# Patient Record
Sex: Female | Born: 1970 | Race: White | Hispanic: No | Smoking: Never smoker
Health system: Southern US, Community
[De-identification: ages and names within clinical notes are randomized; demographics above are authoritative.]

## PROBLEM LIST (undated history)

## (undated) DIAGNOSIS — F329 Major depressive disorder, single episode, unspecified: Secondary | ICD-10-CM

## (undated) DIAGNOSIS — F419 Anxiety disorder, unspecified: Secondary | ICD-10-CM

## (undated) DIAGNOSIS — E119 Type 2 diabetes mellitus without complications: Secondary | ICD-10-CM

## (undated) DIAGNOSIS — E669 Obesity, unspecified: Secondary | ICD-10-CM

## (undated) DIAGNOSIS — G932 Benign intracranial hypertension: Secondary | ICD-10-CM

## (undated) DIAGNOSIS — G44209 Tension-type headache, unspecified, not intractable: Secondary | ICD-10-CM

## (undated) DIAGNOSIS — I10 Essential (primary) hypertension: Secondary | ICD-10-CM

## (undated) DIAGNOSIS — F32A Depression, unspecified: Secondary | ICD-10-CM

## (undated) DIAGNOSIS — Z87442 Personal history of urinary calculi: Secondary | ICD-10-CM

## (undated) DIAGNOSIS — R87629 Unspecified abnormal cytological findings in specimens from vagina: Secondary | ICD-10-CM

## (undated) DIAGNOSIS — G4733 Obstructive sleep apnea (adult) (pediatric): Secondary | ICD-10-CM

## (undated) HISTORY — DX: Major depressive disorder, single episode, unspecified: F32.9

## (undated) HISTORY — PX: ABDOMINAL HYSTERECTOMY: SHX81

## (undated) HISTORY — DX: Obstructive sleep apnea (adult) (pediatric): G47.33

## (undated) HISTORY — PX: WISDOM TOOTH EXTRACTION: SHX21

## (undated) HISTORY — DX: Essential (primary) hypertension: I10

## (undated) HISTORY — DX: Depression, unspecified: F32.A

## (undated) HISTORY — DX: Tension-type headache, unspecified, not intractable: G44.209

## (undated) HISTORY — PX: CRYOTHERAPY: SHX1416

## (undated) HISTORY — DX: Obesity, unspecified: E66.9

## (undated) HISTORY — DX: Benign intracranial hypertension: G93.2

## (undated) HISTORY — DX: Anxiety disorder, unspecified: F41.9

---

## 1998-08-19 ENCOUNTER — Ambulatory Visit (HOSPITAL_COMMUNITY): Admission: RE | Admit: 1998-08-19 | Discharge: 1998-08-19 | Payer: Self-pay | Admitting: Neurology

## 1998-08-26 ENCOUNTER — Ambulatory Visit: Admission: RE | Admit: 1998-08-26 | Discharge: 1998-08-26 | Payer: Self-pay | Admitting: Anesthesiology

## 1998-11-02 ENCOUNTER — Encounter: Payer: Self-pay | Admitting: Emergency Medicine

## 1998-11-02 ENCOUNTER — Emergency Department (HOSPITAL_COMMUNITY): Admission: EM | Admit: 1998-11-02 | Discharge: 1998-11-02 | Payer: Self-pay | Admitting: Emergency Medicine

## 1998-11-29 ENCOUNTER — Other Ambulatory Visit: Admission: RE | Admit: 1998-11-29 | Discharge: 1998-11-29 | Payer: Self-pay | Admitting: Obstetrics and Gynecology

## 1999-11-21 ENCOUNTER — Other Ambulatory Visit: Admission: RE | Admit: 1999-11-21 | Discharge: 1999-11-21 | Payer: Self-pay

## 2000-01-04 ENCOUNTER — Ambulatory Visit (HOSPITAL_COMMUNITY): Admission: RE | Admit: 2000-01-04 | Discharge: 2000-01-04 | Payer: Self-pay | Admitting: Obstetrics and Gynecology

## 2000-01-04 ENCOUNTER — Encounter: Payer: Self-pay | Admitting: Obstetrics and Gynecology

## 2000-03-11 ENCOUNTER — Inpatient Hospital Stay (HOSPITAL_COMMUNITY): Admission: AD | Admit: 2000-03-11 | Discharge: 2000-03-11 | Payer: Self-pay | Admitting: Obstetrics and Gynecology

## 2000-05-16 ENCOUNTER — Inpatient Hospital Stay (HOSPITAL_COMMUNITY): Admission: AD | Admit: 2000-05-16 | Discharge: 2000-05-16 | Payer: Self-pay | Admitting: *Deleted

## 2000-05-20 ENCOUNTER — Inpatient Hospital Stay (HOSPITAL_COMMUNITY): Admission: AD | Admit: 2000-05-20 | Discharge: 2000-05-22 | Payer: Self-pay | Admitting: Obstetrics and Gynecology

## 2000-06-25 HISTORY — PX: MANDIBLE FRACTURE SURGERY: SHX706

## 2000-10-24 ENCOUNTER — Other Ambulatory Visit: Admission: RE | Admit: 2000-10-24 | Discharge: 2000-10-24 | Payer: Self-pay | Admitting: Obstetrics and Gynecology

## 2001-10-27 ENCOUNTER — Other Ambulatory Visit: Admission: RE | Admit: 2001-10-27 | Discharge: 2001-10-27 | Payer: Self-pay | Admitting: Obstetrics and Gynecology

## 2001-11-03 ENCOUNTER — Ambulatory Visit (HOSPITAL_BASED_OUTPATIENT_CLINIC_OR_DEPARTMENT_OTHER): Admission: RE | Admit: 2001-11-03 | Discharge: 2001-11-04 | Payer: Self-pay | Admitting: Oral Surgery

## 2002-12-07 ENCOUNTER — Other Ambulatory Visit: Admission: RE | Admit: 2002-12-07 | Discharge: 2002-12-07 | Payer: Self-pay | Admitting: Obstetrics and Gynecology

## 2003-09-29 ENCOUNTER — Inpatient Hospital Stay (HOSPITAL_COMMUNITY): Admission: AD | Admit: 2003-09-29 | Discharge: 2003-09-29 | Payer: Self-pay | Admitting: Obstetrics and Gynecology

## 2003-10-01 ENCOUNTER — Inpatient Hospital Stay (HOSPITAL_COMMUNITY): Admission: AD | Admit: 2003-10-01 | Discharge: 2003-10-01 | Payer: Self-pay | Admitting: Obstetrics and Gynecology

## 2003-10-09 ENCOUNTER — Inpatient Hospital Stay (HOSPITAL_COMMUNITY): Admission: AD | Admit: 2003-10-09 | Discharge: 2003-10-09 | Payer: Self-pay | Admitting: Obstetrics and Gynecology

## 2003-10-12 ENCOUNTER — Inpatient Hospital Stay (HOSPITAL_COMMUNITY): Admission: AD | Admit: 2003-10-12 | Discharge: 2003-10-12 | Payer: Self-pay | Admitting: Obstetrics and Gynecology

## 2003-10-26 ENCOUNTER — Inpatient Hospital Stay (HOSPITAL_COMMUNITY): Admission: AD | Admit: 2003-10-26 | Discharge: 2003-10-28 | Payer: Self-pay | Admitting: Obstetrics and Gynecology

## 2003-10-26 ENCOUNTER — Encounter (INDEPENDENT_AMBULATORY_CARE_PROVIDER_SITE_OTHER): Payer: Self-pay | Admitting: *Deleted

## 2003-11-24 HISTORY — PX: TUBAL LIGATION: SHX77

## 2003-12-10 ENCOUNTER — Other Ambulatory Visit: Admission: RE | Admit: 2003-12-10 | Discharge: 2003-12-10 | Payer: Self-pay | Admitting: Obstetrics and Gynecology

## 2003-12-17 ENCOUNTER — Ambulatory Visit (HOSPITAL_COMMUNITY): Admission: RE | Admit: 2003-12-17 | Discharge: 2003-12-17 | Payer: Self-pay | Admitting: Obstetrics and Gynecology

## 2005-02-09 ENCOUNTER — Ambulatory Visit (HOSPITAL_COMMUNITY): Admission: RE | Admit: 2005-02-09 | Discharge: 2005-02-09 | Payer: Self-pay | Admitting: Neurology

## 2005-02-12 ENCOUNTER — Encounter: Admission: RE | Admit: 2005-02-12 | Discharge: 2005-02-12 | Payer: Self-pay | Admitting: Neurology

## 2005-02-17 ENCOUNTER — Emergency Department (HOSPITAL_COMMUNITY): Admission: EM | Admit: 2005-02-17 | Discharge: 2005-02-17 | Payer: Self-pay | Admitting: Emergency Medicine

## 2005-02-18 ENCOUNTER — Emergency Department (HOSPITAL_COMMUNITY): Admission: EM | Admit: 2005-02-18 | Discharge: 2005-02-18 | Payer: Self-pay | Admitting: *Deleted

## 2005-06-11 ENCOUNTER — Ambulatory Visit (HOSPITAL_COMMUNITY): Admission: RE | Admit: 2005-06-11 | Discharge: 2005-06-11 | Payer: Self-pay | Admitting: Neurology

## 2005-07-18 ENCOUNTER — Ambulatory Visit (HOSPITAL_COMMUNITY): Admission: RE | Admit: 2005-07-18 | Discharge: 2005-07-18 | Payer: Self-pay | Admitting: Neurology

## 2008-03-24 ENCOUNTER — Encounter: Admission: RE | Admit: 2008-03-24 | Discharge: 2008-03-24 | Payer: Self-pay | Admitting: Neurology

## 2008-04-01 ENCOUNTER — Ambulatory Visit (HOSPITAL_COMMUNITY): Admission: RE | Admit: 2008-04-01 | Discharge: 2008-04-01 | Payer: Self-pay | Admitting: Neurology

## 2008-04-06 ENCOUNTER — Encounter: Admission: RE | Admit: 2008-04-06 | Discharge: 2008-04-06 | Payer: Self-pay | Admitting: Neurology

## 2010-02-10 ENCOUNTER — Emergency Department (HOSPITAL_COMMUNITY): Admission: EM | Admit: 2010-02-10 | Discharge: 2010-02-10 | Payer: Self-pay | Admitting: Emergency Medicine

## 2010-11-10 NOTE — Op Note (Signed)
[place]  Patient:    Kimberly [name], Kimberly [name] Visit Number: [id number] MRN: [id number]          Service Type: DSU Location: [facility] Attending Physician:  Lovena Le Dictated by:   Lovena Le, D.D.S Admit Date:  11/03/2001                       Operative Report  DATE OF BIRTH:  1970/10/13.  PREOPERATIVE DIAGNOSES: 1. Mandibular hypoplasia. 2. Impacted teeth #6 and 11.  POSTOPERATIVE DIAGNOSES: 1. Mandibular hypoplasia. 2. Impacted teeth #6 and 11.  PROCEDURES: 1. Bilateral sagittal split ramus osteotomies of the mandible withrigid    internal fixation. 2. Surgical extraction of impacted teeth6 and 11.  ANESTHESIA:  General via nasoendotracheal intubation.  SURGEON:  Lovena Le, D.D.S  ASSISTANT:  Saddie Benders, D.D.S.  IV FLUIDS:  800 cc of lactated Ringers.  ESTIMATED BLOOD LOSS:  Minimal.  URINE OUTPUT:  None.  SPECIMENS:  None.  INDICATION:  Kimberly [name] is an otherwise healthy 40 year old female with multiple oral-related complaints.  She is unable to chew her food.  Shehas frequent jaw pain and headaches, and the impacted maxillary canines have not been eruptable due to severe impaction and malposition.  DESCRIPTION OF PROCEDURE:  The patient was brought to the operating room at [facility] in satisfactory preoperative condition and placed on the operating room table in the supine position.  Following successful induction of general anesthesia via nasoendotracheal intubation, the patient was prepped and draped in the usual sterile fashion for a procedure of this type.  Initially the oral cavity was thoroughly irrigated with normal saline and suctioned dry and an oropharyngeal throat pack was placed, which was removed at the conclusion of the case.  Next approximately 3 cc of a 2% lidocaine solution with 1:100,000 epinephrine was injected into the palatal soft tissues.  An additional 4 cc  of lidocaine wasinjected into the mandibular posterior regions bilaterally, also 4 cc ofa 0.5% Marcaine solution with 1:200,000 epinephrine was injected into the mandible.  Next a crestal incision was created in the region of the maxillary impacted canines bilaterally.  The periosteal elevator was then utilized to raise a full-thickness mucoperiosteal flap along the palate, thereby exposing the impacted canine teeth #6 and 11.  Next using a Hall drill with a 702 bur, the maxillary canine teeth were then sectioned into multiple fragments and removed with elevator technique.  The operative site was then thoroughly irrigated and closed with multiple interrupted 4-0 chromic gut sutures.  Attention was then turned to the mandibular surgery.  First Bovie electrocautery was utilized in the cutting mode to create an incision along the ascending ramus of the right mandible.  The incision extended for approximately 3 cm and was carried down through mucosa andmuscle to the ascending ramus of the mandible.  A periosteal elevator was again utilized to raise a full-thickness mucoperiosteal flap, exposing both the medial and lateral aspects of the mandible.  A ramus stripper was then utilized to retract and strip the attachment of the temporalis to the coronoid process.  A self-retaining Kocher was then placed at the superior aspect of the ramus.  Next dissection proceeded along the medial aspect of the mandible until the mandibular lingula was identified.  A Hanahan retractor was then placed along the medial aspect so as to protect the entrance of the mandibular nerve into the mandible.  Next utilizing a long  Lindemann bur on the handpiece, the medial cut of the sagittal split osteotomy was then carried out under copious irrigation.  The 701 bur wasthen utilized to create the descending cut along the ascending ramus.  Achannel retractor was then placed at the inferior border, and the 703 bur was utilized  to make the lateral cut down to the inferior border of themandible.  Next multiple osteotomes were used in succession with a malleting technique to carry out the sagittal split.  The Smith mandibular spreading instruments were then utilized to finalize the sagittal split of the right aspect of the mandible.  The inferior alveolar nerve was noted to be intact and within the distal segment as planned.  This completed the sagittal split on the right side.  A pack was placed within the operative site, and attention was directed to the patients left side.  The mandibular sagittal split osteotomy was created and carried out in an identical fashion as was done on the right side.  Once the mandible was completely freed, the previously-fabricated occlusal splint was wired into place, and itshould also be noted that the mandibular nerve was noted to be uninjuredand intact on the left as well.  Once the patient was wired into the corrected occlusion, the proximal segment on the left side was then positioned into its correct position and stabilized with two Osteomed screws.  The anterior screw was 14 mm in length and the posterior screw was 12 mm inlength.  The fixation was then tested with a periosteal elevator and attention was directed back to the patients right side, where the mandibularsegments were positioned into their correct position and fixated with two additional Osteomed screws, again 14 mm and 12 mm in length.  The patient was then released from maxillomandibular fixation.  The operative sites were thoroughly irrigated, and the sites were then closed with a running 3-0 Vicryl suture bilaterally.  The oropharyngeal throat pack was removed, and the patient was placed into maxillomandibular fixation with two anterior 26-gauge stainless steel wires.  This completed the procedure.  The patient was allowed to recover from general anesthesia and was transported to the postanesthesia care unit in  satisfactory postoperative condition.  All sponge, needle, and instrument counts were correct at the conclusion of the case.  Dictated by:   Lovena Le, D.D.S Attending Physician:  Lovena Le DD:  11/03/01 TD:  11/04/01 Job: [id number]

## 2010-11-10 NOTE — Discharge Summary (Signed)
Kimberly [name], Kimberly [name]                       ACCOUNT NO.:  [id number]   MEDICAL RECORD NO.:  [id number]                   PATIENT TYPE:  INP   LOCATION:  9129                                 FACILITY:  [facility]   PHYSICIAN:  Zenaida Niece, M.D.             DATE OF BIRTH:  1970/07/09   DATE OF ADMISSION:  10/26/2003  DATE OF DISCHARGE:  10/28/2003                                 DISCHARGE SUMMARY   ADMISSION DIAGNOSES:  1. Intrauterine pregnancy at 38 weeks.  2. Gestational hypertension.   DISCHARGE DIAGNOSES:  1. Intrauterine pregnancy at 38 weeks.  2. Gestational hypertension.  3. Postpartum hemorrhage.   PROCEDURES:  On Oct 26, 2003 she had a spontaneous vaginal delivery.   HISTORY OF PRESENT ILLNESS:  This is a 40 year old white female gravida 4  para 1-1-1-2 with an EGA of [redacted] weeks who presents for induction with  gestational hypertension and a favorable cervix.  Prenatal labs:  Blood type  is O positive with a negative antibody screen, RPR nonreactive, rubella  immune, hepatitis B surface antigen negative, HIV negative, gonorrhea and  chlamydia negative, triple screen normal, 1-hour Glucola is 112, and group B  strep is negative.  Prenatal care complicated by sinusitis at 17 weeks  treated with Augmentin and a URI at 26 weeks also treated with Augmentin,  occasional elevated blood pressure since approximately 24 weeks with  occasional proteinuria with a normal 24-hour urine protein.  She had preterm  contractions at 30 weeks with a negative fetal fibronectin and she has had  reactive nonstress tests for decreased fetal movement.  Past OB history:  In  1995 a vaginal delivery at 34 weeks, 6 pounds 9 ounces, complicated by  preterm premature rupture of membranes.  In 1997 elective termination, and  in 2001 a vaginal delivery at 37 weeks, 6 pounds 10 ounces, no  complications.  Past medical history:  Pseudotumor cerebri.  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  Medications:  Diamox.  Physical exam:  She was afebrile with stable vital signs with a blood  pressure of 130/80.  Fetal heart tracing was reactive without contractions.  Abdomen gravid, nontender, with an estimated fetal weight of 7 pounds.  Cervix was 4, 50, -2, with a vertex presentation and amniotomy revealed  clear fluid.   HOSPITAL COURSE:  The patient was admitted and had the above-mentioned  amniotomy performed for induction.  She entered active labor on her own and  received an epidural.  She progressed to complete, pushed well, and on the  afternoon of May 3 had a vaginal delivery of a viable female infant with  Apgars of 6 and 8 that weighed 8 pounds 7 ounces.  There was a tight  shoulder cord reduced with delivery.  The placenta delivered spontaneous and  was intact.  The shoulders were slightly difficult to deliver but there was  not a true  shoulder dystocia.  Arterial cord pH was 7.07.  There were  several superficial perineal abrasions and 3-0 Vicryl was used at 6 o'clock  for cosmesis.  Estimated blood loss was less than 500 mL.  Postpartum she  was initially set up for a postpartum tubal ligation.  However, she passed  some clots and had intermittent increase in bleeding with some  lightheadedness.  This was treated with massage, IV fluids, IV Pitocin, and  Hemabate.  Fundus remained firm and I did an exam and approximately 100 mL  of clots were expressed from the lower uterine segment.  Hemoglobin fell  from a predelivery value of 11.4 to 10.8 immediately postpartum and 8.7 on  postpartum day #1.  Due to her anemia we elected to not proceed with tubal  ligation.  On postpartum day #2 she had decreased bleeding, felt well, and  was felt to stable enough for discharge home.   DISCHARGE INSTRUCTIONS:  1. Regular diet, pelvic rest, and follow up in approximately 6 weeks.  2. Medications are Percocet #20 one to two p.o. q.4-6h. p.r.n. and over-the-      counter ibuprofen as needed as well as over-the-counter iron b.i.d.  3. She is given our discharge pamphlet.                                               Zenaida Niece, M.D.    TDM/MEDQ  D:  11/09/2003  T:  11/09/2003  Job:  [id number]

## 2010-11-10 NOTE — Procedures (Signed)
REFERRING PHYSICIAN:  Rene Kocher, M.D.   CLINICAL HISTORY:  A 40 year old lady with an episode of loss of  consciousness.   MEDICATION LIST:  Wellbutrin, Lexapro, hydrochlorothiazide, Diamox, and  Restoril.   The background awake rhythm is 9 to 10 hertz alpha which is symmetric in  both hemispheres and reactive to eye opening and closure.  No definite  paroxysmal epileptiform activities, spikes or sharp waves are seen.  Definite sleep changes are not seen except mild drowsiness which is  uneventful.  Hyperventilation results in no significant abnormalities.  Photic stimulation results in symmetric driving response.  EKG tracing  revealed regular sinus rhythm.  Technical component of the study is  adequate.  Length of the tracing is 25.5 minutes.   IMPRESSION:  This electroencephalogram performed in wakeful and drowsy  states is within normal limits, no definite epileptiform activity is  identified.           ______________________________  Sunny Schlein. Pearlean Brownie, MD     ZOX:WRUE  D:  07/18/2005 16:18:38  T:  07/18/2005 23:11:46  Job #:  [id number]   cc:   Rene Kocher, M.D.  Fax: [phone]

## 2010-11-10 NOTE — H&P (Signed)
NAME:  Kimberly [name], Kimberly [name] NO.:  [id number]   MEDICAL RECORD NO.:  [id number]                   PATIENT TYPE:   LOCATION:                                       FACILITY:   PHYSICIAN:  Zenaida Niece, M.D.             DATE OF BIRTH:   DATE OF ADMISSION:  12/17/2003  DATE OF DISCHARGE:                                HISTORY & PHYSICAL   CHIEF COMPLAINT:  Desires surgical sterility.   HISTORY OF PRESENT ILLNESS:  This is a 40 year old white female, para 2-1-1-  3, who is status post a spontaneous vaginal delivery on May 3 complicated by  a postpartum hemorrhage, who desires surgical sterility.  All contraceptive  options have been discussed with the patient as well as permanency and risks  of tubal ligation, and she wishes to proceed with laparoscopic tubal  fulguration.   PAST OBSTETRICAL HISTORY:  She has had a preterm delivery at 34 weeks, 6  pounds 9 ounces followed by an elective termination followed by two term  vaginal deliveries, this most recent one complicated by postpartum  hemorrhage and gestational hypertension.   PAST MEDICAL HISTORY:  Pseudotumor cerebri.   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   CURRENT MEDICATIONS:  Diamox 2 p.o. daily.   ALLERGIES:  None known.   SOCIAL HISTORY:  She is married and denies alcohol, tobacco, or drug use.   FAMILY HISTORY:  No GYN or colon cancer.   REVIEW OF SYSTEMS:  She is bottle feeding and does have some feet swelling.   PHYSICAL EXAMINATION:  GENERAL:  This is a slightly obese white female in no  acute distress.  VITAL SIGNS:  Weight is 214 pounds, blood pressure 142/88.  NECK:  Supple without lymphadenopathy or thyromegaly.  LUNGS:  Clear to auscultation.  HEART:  Regular rate and rhythm without murmur.  ABDOMEN:  Soft, nontender, nondistended without palpable masses.  PELVIC:  External genitalia is well healed.  Speculum exam has a normal  cervix.  Pap smear  performed and does return within normal limits.  Bimanual  exam shows a small, mid planar, nontender uterus and no adnexal masses.   IMPRESSION:  Desires surgical sterility.  All options and risks of surgery  including bleeding, infection, damage to surrounding organs have been  discussed with the patient.  She also understands a 1:150 failure rate and  increased risk of ectopic pregnancy if she does become pregnant.  The  patient understands these risks and wishes to proceed.   PLAN:  Admit the patient for a laparoscopic bilateral tubal fulguration.                                               Zenaida Niece, M.D.    TDM/MEDQ  D:  12/16/2003  T:  12/16/2003  Job:  [id number]

## 2010-11-10 NOTE — Procedures (Signed)
EEG NUMBER:   REFERRING PHYSICIAN:  Rene Kocher, M.D.   CLINICAL HISTORY:  A 40 year old lady with generalized headaches, new onset  blackouts.   MEDICATIONS:  Wellbutrin, Lexapro, Diamox, Xanax, and Restoril.   This is a 17-channel EEG performed with the patient described as being  lethargic.   The background rhythm consists of 9 to 10 hertz alpha which is of moderate  amplitude, synchronous in both hemispheres and reactive to eye opening and  closure. No paroxysmal epileptiform activity, spikes, or sharp waves are  identified. Intermittent biparietal sharp waves are identified bilaterally  particularly with drowsiness. Changes of light sleep are achieved in this  tracing and show physiological findings. No definite paroxysmal epileptiform  activity is identified. Hyperventilation and photic stimulation are both  unremarkable. Length of the tracing is 31.2 minutes. Technical component is  suboptimal with excessive movement artifacts compromising the study. EKG  tracing is also suboptimal but appears to be a regular sinus rhythm.   IMPRESSION:  This EEG is abnormal due to the presence of bilateral mild  _______________. If seizures are expected, a repeat sleep deprived study may  be beneficial.           ______________________________  Sunny Schlein. Pearlean Brownie, MD     ZOX:WRUE  D:  06/11/2005 16:26:57  T:  06/13/2005 08:26:01  Job #:  [id number]

## 2010-11-10 NOTE — Discharge Summary (Signed)
[facility]  Patient:    Kimberly [name], Kimberly [name]                    MRN: [id number] Adm. Date:  [id number] Disc. Date: 05/22/00 Attending:  Malon Kindle                           Discharge Summary  DISCHARGE DIAGNOSES:          1. Term pregnancy at 37+ weeks, delivered.                               2. History of pseudotumor of cerebri.                               3. Meconium-stained amniotic fluid.                               4. Status post normal spontaneous vaginal                                  delivery.  DISCHARGE MEDICATIONS:        1. Motrin 600 mg p.o. q.6h. p.r.n.                               2. Percocet one to two tablets p.o. q.4h. p.r.n.  DISCHARGE FOLLOWUP:           The patient is to follow up in six weeks for her routine postpartum exam.  ADMISSION HISTORY:            The patient is a 40 year old, G3, P 1-0-1-1, who was admitted at 37+ weeks in early labor.  Pregnancy had been complicated for preterm labor at 28 weeks for which she had been managed with bed rest and terbutaline.  Also, pregnancy was complicated by history of pseudotumor of cerebri which was treated with Diamox throughout her pregnancy.  PRENATAL LABORATORY DATA:     O positive, antibody negative, RPR nonreactive, rubella immune, hepatitis B surface antigen negative, HIV negative, GC negative, Chlamydia negative.  PAST MEDICAL HISTORY:         Significant for pseudotumor cerebri as stated.  ALLERGIES:                    She had no known drug allergies.  PAST SURGICAL HISTORY:        No surgeries.  MEDICATIONS ON ADMISSION:     1. Diamox 250 mg p.o. q.d.                               2. Prenatal vitamins.  OBSTETRICAL HISTORY:          Significant for a normal spontaneous vaginal delivery of a 6-pound 9-ounce female at approximately 35 weeks.  HOSPITAL COURSE:              The patient was admitted with contractions every three to four minutes with  slow cervical change.  She was rule out for rupture of membranes.  On admission she was afebrile with stable vital signs.  Fetal  heart rate was reactive.  Cervix was 4 cm, 70% effaced, and a -2 station.  The patient had assisted rupture of membranes after progressing to 5 cm, 70% effaced, and -1 station.  Fetal heart rate remained reactive.  She had light meconium noted.  She progressed quickly thereafter and received some Pitocin augmentation.  She then progressed to complete and pushed well with a normal spontaneous vaginal delivery of a 6-pound 10-ounce vigorous female infant over an intact perineum.  Apgars were _______ .  Placenta was delivered spontaneously.  She had three labial abrasions which were hemostatic and not repaired.  The infant was DeLee suctioned with dilute meconium noted, and pediatrics evaluated the baby shortly after delivery.  She did well and on postpartum day #2 was discharged to home, afebrile, stable vital signs, and no complaints.  She was going to continue her Diamox and was bottle feeding upon discharge. DD:  05/22/00 TD:  05/22/00 Job: [id number]

## 2010-11-10 NOTE — Op Note (Signed)
[name], Kimberly [name]                       ACCOUNT NO.:  [id number]   MEDICAL RECORD NO.:  [id number]                   PATIENT TYPE:  AMB   LOCATION:  SDC                                  FACILITY:  [facility]   PHYSICIAN:  Zenaida Niece, M.D.             DATE OF BIRTH:  [birth year]   DATE OF PROCEDURE:  12/17/2003  DATE OF DISCHARGE:                                 OPERATIVE REPORT   PREOPERATIVE DIAGNOSIS:  Desires surgical sterility.   POSTOPERATIVE DIAGNOSIS:  Desires surgical sterility.   PROCEDURE:  Laparoscopic bilateral tubal fulguration.   SURGEON:  Zenaida Niece, M.D.   ANESTHESIA:  General endotracheal anesthesia.   ESTIMATED BLOOD LOSS:  Less than 50 mL.   FINDINGS:  The patient had normal anatomy.  There was a small serosal tear  made in the posterior uterus with the Kleppinger while moving the bowel.  Minimal bleeding from this was controlled with Bipolar cautery.   DESCRIPTION OF PROCEDURE:  The patient was taken to the operating room and  placed in the dorsal supine position.  General anesthesia was induced and  she was placed in mobile stirrups and the left arm tucked to her side.  Abdomen was prepped and draped in the usual sterile fashion, bladder drained  with a red rubber catheter, Hulka tenaculum applied to the cervix for  uterine manipulation.  Infraumbilical skin was then infiltrated with 0.25%  Marcaine and a 1.5 cm horizontal incision was made.  The Veress needle  was  inserted into the peritoneal cavity and placement confirmed by the water  drop test and an opening pressure of 4 mmHg.  CO2 gas was insufflated to a  pressure of 14 and the Veress needle  was removed.  A size 11 disposable  trocar was introduced and placement confirmed by the laparoscope.  Inspection of the pelvis revealed normal tubes, ovaries and uterus.  No  significant adhesions.  The sigmoid colon was up-draped over the left fundal  portion of the uterus.  In moving this away,  a small tear was made in the  posterior uterine serosa.  Bleeding from this was controlled with bipolar  cautery.  Both fallopian tubes were identified and traced to their  fimbriated ends.  Three segments of each tube in the mid portion were  desiccated with bipolar cautery.  This was done until the amp meter read 0  in all spots.  This achieved good separation of the proximal and distal ends  of the tube.  The defect in the posterior uterine serosa was again inspected  and found to be hemostatic.  The laparoscope was removed and all gas allowed  to deflate from the abdomen.  The umbilical trocar was removed.  Skin  incision was closed with interrupted subcuticular sutures of 3-0 Vicryl  followed by Steri-Strips and band-aid.  The patient tolerated the procedure  well, was extubated in the operating room and taken to  the recovery room in  stable condition.  Counts were correct and she received no antibiotics.  Of  note, while attempting to balance the scope, the light cord came off the  scope and make a small burn in the drape.  However, there was a towel over  the patient and there was no burn noted on the patient.                                               Zenaida Niece, M.D.   TDM/MEDQ  D:  12/17/2003  T:  12/17/2003  Job:  [id number]

## 2011-01-23 ENCOUNTER — Encounter: Payer: Self-pay | Admitting: Family Medicine

## 2011-01-23 DIAGNOSIS — E669 Obesity, unspecified: Secondary | ICD-10-CM | POA: Insufficient documentation

## 2011-03-26 LAB — CSF CELL COUNT WITH DIFFERENTIAL
RBC Count, CSF: 0
RBC Count, CSF: 1 — ABNORMAL HIGH
Tube #: 1
Tube #: 4
WBC, CSF: 0
WBC, CSF: 1

## 2011-03-26 LAB — CRYPTOCOCCAL ANTIGEN, CSF: Crypto Ag: NEGATIVE

## 2011-03-26 LAB — CSF CULTURE: Culture: NO GROWTH

## 2011-03-26 LAB — CSF CULTURE W GRAM STAIN

## 2011-03-26 LAB — PROTEIN AND GLUCOSE, CSF
Glucose, CSF: 59
Total  Protein, CSF: 21

## 2012-09-30 ENCOUNTER — Telehealth: Payer: Self-pay | Admitting: Family Medicine

## 2012-09-30 MED ORDER — ALPRAZOLAM 1 MG PO TABS: 1.0000 mg | ORAL_TABLET | Freq: Every evening | ORAL | Status: DC | PRN

## 2012-09-30 NOTE — Telephone Encounter (Signed)
[facility]

## 2012-09-30 NOTE — Telephone Encounter (Signed)
Ok to refill 

## 2012-09-30 NOTE — Telephone Encounter (Signed)
?  ok to refill °

## 2012-10-29 ENCOUNTER — Other Ambulatory Visit: Payer: Self-pay | Admitting: Family Medicine

## 2012-10-29 ENCOUNTER — Telehealth: Payer: Self-pay | Admitting: Family Medicine

## 2012-10-29 NOTE — Telephone Encounter (Signed)
Ok to refill 

## 2012-10-29 NOTE — Telephone Encounter (Signed)
[facility]

## 2012-10-29 NOTE — Telephone Encounter (Signed)
?   OK to Refill  

## 2012-11-06 ENCOUNTER — Ambulatory Visit: Payer: Self-pay | Admitting: Family Medicine

## 2012-12-03 ENCOUNTER — Other Ambulatory Visit: Payer: Self-pay | Admitting: Family Medicine

## 2012-12-03 NOTE — Telephone Encounter (Signed)
?   OK to Refill  

## 2012-12-03 NOTE — Telephone Encounter (Signed)
Ok to refill 

## 2012-12-04 ENCOUNTER — Telehealth: Payer: Self-pay | Admitting: Family Medicine

## 2012-12-05 NOTE — Telephone Encounter (Signed)
Called in 12/03/12

## 2012-12-24 ENCOUNTER — Other Ambulatory Visit: Payer: Self-pay | Admitting: Family Medicine

## 2012-12-24 NOTE — Telephone Encounter (Signed)
This was filled on 6/9, too early for refill, she has appt 7/9 with Dr. Tanya Nones, would wait until appt

## 2012-12-24 NOTE — Telephone Encounter (Signed)
?  ok to refill.[facility]

## 2012-12-24 NOTE — Telephone Encounter (Signed)
Noted pt. aware

## 2012-12-31 ENCOUNTER — Encounter: Payer: Self-pay | Admitting: Family Medicine

## 2012-12-31 ENCOUNTER — Ambulatory Visit (INDEPENDENT_AMBULATORY_CARE_PROVIDER_SITE_OTHER): Payer: BC Managed Care – PPO | Admitting: Family Medicine

## 2012-12-31 VITALS — BP 120/80 | HR 78 | Temp 98.0°F | Resp 18 | Ht 61.0 in | Wt 248.0 lb

## 2012-12-31 DIAGNOSIS — I10 Essential (primary) hypertension: Secondary | ICD-10-CM | POA: Insufficient documentation

## 2012-12-31 DIAGNOSIS — F419 Anxiety disorder, unspecified: Secondary | ICD-10-CM | POA: Insufficient documentation

## 2012-12-31 DIAGNOSIS — F32A Depression, unspecified: Secondary | ICD-10-CM | POA: Insufficient documentation

## 2012-12-31 DIAGNOSIS — Z1239 Encounter for other screening for malignant neoplasm of breast: Secondary | ICD-10-CM

## 2012-12-31 DIAGNOSIS — F411 Generalized anxiety disorder: Secondary | ICD-10-CM

## 2012-12-31 DIAGNOSIS — F329 Major depressive disorder, single episode, unspecified: Secondary | ICD-10-CM

## 2012-12-31 DIAGNOSIS — G932 Benign intracranial hypertension: Secondary | ICD-10-CM

## 2012-12-31 LAB — COMPLETE METABOLIC PANEL WITH GFR
ALT: 30 U/L (ref 0–35)
AST: 15 U/L (ref 0–37)
Albumin: 4.3 g/dL (ref 3.5–5.2)
Alkaline Phosphatase: 79 U/L (ref 39–117)
BUN: 19 mg/dL (ref 6–23)
CO2: 22 mEq/L (ref 19–32)
Calcium: 9.4 mg/dL (ref 8.4–10.5)
Chloride: 108 mEq/L (ref 96–112)
Creat: 1.18 mg/dL — ABNORMAL HIGH (ref 0.50–1.10)
GFR, Est African American: 66 mL/min
GFR, Est Non African American: 57 mL/min — ABNORMAL LOW
Glucose, Bld: 104 mg/dL — ABNORMAL HIGH (ref 70–99)
Potassium: 4.1 mEq/L (ref 3.5–5.3)
Sodium: 142 mEq/L (ref 135–145)
Total Bilirubin: 0.6 mg/dL (ref 0.3–1.2)
Total Protein: 6.7 g/dL (ref 6.0–8.3)

## 2012-12-31 LAB — LIPID PANEL
Cholesterol: 196 mg/dL (ref 0–200)
HDL: 32 mg/dL — ABNORMAL LOW (ref >39)
LDL Cholesterol: 110 mg/dL — ABNORMAL HIGH (ref 0–99)
Total CHOL/HDL Ratio: 6.1 Ratio
Triglycerides: 268 mg/dL — ABNORMAL HIGH (ref <150)
VLDL: 54 mg/dL — ABNORMAL HIGH (ref 0–40)

## 2012-12-31 LAB — CBC WITH DIFFERENTIAL/PLATELET
Basophils Absolute: 0 10*3/uL (ref 0.0–0.1)
Basophils Relative: 0 % (ref 0–1)
Eosinophils Absolute: 0.1 10*3/uL (ref 0.0–0.7)
Eosinophils Relative: 2 % (ref 0–5)
HCT: 42.2 % (ref 36.0–46.0)
Hemoglobin: 14.4 g/dL (ref 12.0–15.0)
Lymphocytes Relative: 31 % (ref 12–46)
Lymphs Abs: 2.3 10*3/uL (ref 0.7–4.0)
MCH: 28.6 pg (ref 26.0–34.0)
MCHC: 34.1 g/dL (ref 30.0–36.0)
MCV: 83.9 fL (ref 78.0–100.0)
Monocytes Absolute: 0.5 10*3/uL (ref 0.1–1.0)
Monocytes Relative: 7 % (ref 3–12)
Neutro Abs: 4.4 10*3/uL (ref 1.7–7.7)
Neutrophils Relative %: 60 % (ref 43–77)
Platelets: 172 10*3/uL (ref 150–400)
RBC: 5.03 MIL/uL (ref 3.87–5.11)
RDW: 13.3 % (ref 11.5–15.5)
WBC: 7.4 10*3/uL (ref 4.0–10.5)

## 2012-12-31 MED ORDER — ESCITALOPRAM OXALATE 10 MG PO TABS: 10.0000 mg | ORAL_TABLET | Freq: Every day | ORAL | Status: DC

## 2012-12-31 NOTE — Progress Notes (Signed)
Subjective:    Patient ID: Kimberly [name], female    DOB: May 18, 1971, 42 y.o.   MRN: [id number]  HPI Patient has a history of generalized anxiety disorder as well as depression. She is currently taking Wellbutrin SR 150 mg by mouth twice a day. She is also requiring Xanax 1 mg by mouth 3 times a day. She reports feeling palpitations, tinnitus, panic attacks when she doesn't take the medicine every 8 hours. She also reports increased stress in her life and worsening depression.  She has never tried Lexapro to her knowledge.  Has hypertension currently on atenolol 100 mg by mouth daily. She denies any chest pain or shortness of breath or dyspnea on exertion. She is overdue for cholesterol.  She was recently found to have precancerous cells on Pap smear. I encouraged her to follow back up with her gynecologist and she underwent a cone biopsy.  I stressed this needs to be followed up. She is also overdue for mammogram.  She is a history of pseudotumor cerebri.  Headaches are currently well controlled on Diamox 250 mg tablets twice a day.  She is overdue for metabolic panel. Past Medical History  Diagnosis Date  . Obesity   . Pseudotumor cerebri   . Migraine   . Tension headache   . Anxiety   . Depression   . Cancer     cervical  . Hypertension    Current Outpatient Prescriptions on File Prior to Visit  Medication Sig Dispense Refill  . acetaZOLAMIDE (DIAMOX) 250 MG tablet Take 250 mg by mouth 2 (two) times daily.        Marland Kitchen ALPRAZolam (XANAX) 1 MG tablet TAKE ONE TABLET BY MOUTH THREE TIMES DAILY AS NEEDED  90 tablet  0  . atenolol (TENORMIN) 100 MG tablet Take 100 mg by mouth daily.        Marland Kitchen buPROPion (WELLBUTRIN SR) 150 MG 12 hr tablet Take 150 mg by mouth 2 (two) times daily.        Marland Kitchen loratadine (CLARITIN) 10 MG tablet Take 10 mg by mouth daily.        . valACYclovir (VALTREX) 500 MG tablet Take 500 mg by mouth daily.         No current facility-administered medications on file prior  to visit.   No Known Allergies History   Social History  . Marital Status: Legally Separated    Spouse Name: N/A    Number of Children: N/A  . Years of Education: N/A   Occupational History  . Not on file.   Social History Main Topics  . Smoking status: Never Smoker   . Smokeless tobacco: Not on file  . Alcohol Use: Not on file  . Drug Use: Not on file  . Sexually Active: Not on file   Other Topics Concern  . Not on file   Social History Narrative  . No narrative on file   History reviewed. No pertinent family history.    Review of Systems  All other systems reviewed and are negative.       Objective:   Physical Exam  Vitals reviewed. Constitutional: She appears well-developed and well-nourished.  Neck: Neck supple. No JVD present. No thyromegaly present.  Cardiovascular: Normal rate, regular rhythm, normal heart sounds and intact distal pulses.  Exam reveals no gallop and no friction rub.   No murmur heard. Pulmonary/Chest: Effort normal and breath sounds normal. No respiratory distress. She has no wheezes. She has no rales. She exhibits  no tenderness.  Abdominal: Soft. Bowel sounds are normal. She exhibits no distension and no mass. There is no tenderness. There is no rebound and no guarding.  Lymphadenopathy:    She has no cervical adenopathy.  Skin: Skin is warm. No rash noted. No erythema. No pallor.  Psychiatric: She has a normal mood and affect. Her behavior is normal. Judgment and thought content normal.   She has trace bipedal edema.       Assessment & Plan:  1. GAD (generalized anxiety disorder) I will add Lexapro 10 mg by mouth daily to her current regimen. I have asked the patient to call me in one month to see if anxiety is improving. She will otherwise continue the Wellbutrin and Xanax - escitalopram (LEXAPRO) 10 MG tablet; Take 1 tablet (10 mg total) by mouth daily.  Dispense: 30 tablet; Refill: 1  2. Pseudotumor cerebri Check a basic  metabolic panel to rule out acidosis  3. Depression Add Lexapro as discussed in problem #1. - escitalopram (LEXAPRO) 10 MG tablet; Take 1 tablet (10 mg total) by mouth daily.  Dispense: 30 tablet; Refill: 1 - CBC with Differential  4. HTN (hypertension) Well controlled, continue atenolol 100 mg by mouth daily. Check a fasting lipid panel. Goal LDL is less than 130.  Patient is also overweight. I did recommend increasing daily aerobic exercise to 30 minutes a day 5 days a week to achieve weight loss. - COMPLETE METABOLIC PANEL WITH GFR - Lipid panel - CBC with Differential  5. Breast cancer screening Scheduled mammogram. - MM Digital Screening; Future

## 2013-01-02 ENCOUNTER — Other Ambulatory Visit: Payer: Self-pay | Admitting: Family Medicine

## 2013-01-27 ENCOUNTER — Other Ambulatory Visit: Payer: Self-pay | Admitting: Family Medicine

## 2013-01-27 NOTE — Telephone Encounter (Signed)
rx called in

## 2013-01-27 NOTE — Telephone Encounter (Signed)
Ok to refill 

## 2013-01-27 NOTE — Telephone Encounter (Signed)
?   OK to Refill  

## 2013-02-26 ENCOUNTER — Other Ambulatory Visit: Payer: Self-pay | Admitting: Family Medicine

## 2013-02-26 NOTE — Telephone Encounter (Signed)
ok 

## 2013-02-26 NOTE — Telephone Encounter (Signed)
?   OK to Refill  

## 2013-03-31 ENCOUNTER — Other Ambulatory Visit: Payer: Self-pay | Admitting: Family Medicine

## 2013-03-31 NOTE — Telephone Encounter (Signed)
RX called in .

## 2013-03-31 NOTE — Telephone Encounter (Signed)
Last refill 9/4 #90  Last OV 12/31/12  OK refill?

## 2013-03-31 NOTE — Telephone Encounter (Signed)
ok 

## 2013-04-29 ENCOUNTER — Other Ambulatory Visit: Payer: Self-pay | Admitting: Family Medicine

## 2013-04-29 MED ORDER — ACETAZOLAMIDE 250 MG PO TABS: 250.0000 mg | ORAL_TABLET | Freq: Two times a day (BID) | ORAL | Status: DC

## 2013-04-29 NOTE — Telephone Encounter (Signed)
Rx Refilled  

## 2013-05-04 ENCOUNTER — Other Ambulatory Visit: Payer: Self-pay | Admitting: Family Medicine

## 2013-05-04 NOTE — Telephone Encounter (Signed)
Ok to refill 

## 2013-05-04 NOTE — Telephone Encounter (Signed)
ok 

## 2013-05-26 ENCOUNTER — Other Ambulatory Visit: Payer: Self-pay | Admitting: Family Medicine

## 2013-05-26 NOTE — Telephone Encounter (Signed)
ok 

## 2013-05-26 NOTE — Telephone Encounter (Signed)
Last Rf 05/04/13  #90  Last OV 7/9  OK refill?

## 2013-06-23 ENCOUNTER — Other Ambulatory Visit: Payer: Self-pay | Admitting: Family Medicine

## 2013-06-23 NOTE — Telephone Encounter (Signed)
Ok to refill but NTBS before next refill (6 months).

## 2013-06-23 NOTE — Telephone Encounter (Signed)
?   OK to Refill  

## 2013-07-24 ENCOUNTER — Other Ambulatory Visit: Payer: Self-pay | Admitting: Family Medicine

## 2013-07-24 NOTE — Telephone Encounter (Signed)
ok 

## 2013-07-24 NOTE — Telephone Encounter (Signed)
?   OK to Refill  

## 2013-08-18 ENCOUNTER — Ambulatory Visit: Payer: BC Managed Care – PPO | Admitting: Family Medicine

## 2013-08-21 ENCOUNTER — Ambulatory Visit: Payer: Self-pay | Admitting: Family Medicine

## 2013-08-28 ENCOUNTER — Encounter: Payer: Self-pay | Admitting: Family Medicine

## 2013-08-28 ENCOUNTER — Ambulatory Visit (INDEPENDENT_AMBULATORY_CARE_PROVIDER_SITE_OTHER): Payer: Self-pay | Admitting: Family Medicine

## 2013-08-28 VITALS — BP 110/72 | HR 68 | Temp 97.1°F | Resp 16 | Ht 61.0 in | Wt 265.0 lb

## 2013-08-28 DIAGNOSIS — K529 Noninfective gastroenteritis and colitis, unspecified: Secondary | ICD-10-CM

## 2013-08-28 DIAGNOSIS — R197 Diarrhea, unspecified: Secondary | ICD-10-CM

## 2013-08-28 NOTE — Progress Notes (Signed)
Subjective:    Patient ID: Kimberly [name], female    DOB: May 14, 1971, 43 y.o.   MRN: [id number]  HPI Patient is a very pleasant 43 year old white female who presents with chronic diarrhea for the last 2 months. She states that 15-20 minutes after she she develops a heaviness and fullness in her epigastric area. She denies any sharp or stabbing pain. Then approximately one hour after he she will develop severe artery diarrhea that is bright on ranging in color. She states that it can be times and even projectile. This occurs 3-4 times a day. The patient was last seen in July. She has gained 17 pounds since that office visit. She denies any fevers or weight loss. She denies any chills or arthralgias. She denies any rashes. She does have a significant family history for a mother with Crohn's disease. She denies any change in her diet. She denies any exacerbating or alleviating factors. She denies any indigestion or acid reflux. She is unable to pinpoint certain foods which trigger the diarrhea. She has not changed her diet or any medications. She's not taking any herbal supplements. Past Medical History  Diagnosis Date  . Obesity   . Pseudotumor cerebri   . Migraine   . Tension headache   . Anxiety   . Depression   . Cancer     cervical  . Hypertension    Current Outpatient Prescriptions on File Prior to Visit  Medication Sig Dispense Refill  . acetaZOLAMIDE (DIAMOX) 250 MG tablet Take 1 tablet (250 mg total) by mouth 2 (two) times daily.  60 tablet  3  . ALPRAZolam (XANAX) 1 MG tablet TAKE ONE TABLET BY MOUTH THREE TIMES DAILY AS NEEDED  90 tablet  2  . atenolol (TENORMIN) 100 MG tablet TAKE ONE TABLET BY MOUTH EVERY DAY  30 tablet  5  . buPROPion (WELLBUTRIN SR) 150 MG 12 hr tablet TAKE ONE TABLET BY MOUTH TWICE DAILY  60 tablet  5  . escitalopram (LEXAPRO) 10 MG tablet TAKE ONE TABLET BY MOUTH ONCE DAILY  30 tablet  5  . loratadine (CLARITIN) 10 MG tablet Take 10 mg by mouth daily.         . valACYclovir (VALTREX) 500 MG tablet Take 500 mg by mouth daily.         No current facility-administered medications on file prior to visit.   No Known Allergies History   Social History  . Marital Status: Legally Separated    Spouse Name: N/A    Number of Children: N/A  . Years of Education: N/A   Occupational History  . Not on file.   Social History Main Topics  . Smoking status: Never Smoker   . Smokeless tobacco: Not on file  . Alcohol Use: Not on file  . Drug Use: Not on file  . Sexual Activity: Not on file   Other Topics Concern  . Not on file   Social History Narrative  . No narrative on file      Review of Systems  All other systems reviewed and are negative.       Objective:   Physical Exam  Vitals reviewed. Cardiovascular: Normal rate, regular rhythm, normal heart sounds and intact distal pulses.   Pulmonary/Chest: Effort normal and breath sounds normal. No respiratory distress. She has no wheezes. She has no rales.  Abdominal: Soft. Bowel sounds are normal. She exhibits no distension. There is no tenderness. There is no rebound.  Assessment & Plan:  1. Chronic diarrhea Differential diagnosis includes celiac disease, inflammatory bowel disease, irritable bowel syndrome, thyroid problems. I doubt infection given the chronicity of her problems. I will check a CBC, CMP, celiac panel, TSH, and sedimentation rate. - CBC with Differential - COMPLETE METABOLIC PANEL WITH GFR - Sedimentation rate - TSH - Celiac panel 10

## 2013-08-29 LAB — CBC WITH DIFFERENTIAL/PLATELET
Basophils Absolute: 0 10*3/uL (ref 0.0–0.1)
Basophils Relative: 0 % (ref 0–1)
Eosinophils Absolute: 0.2 10*3/uL (ref 0.0–0.7)
Eosinophils Relative: 2 % (ref 0–5)
HCT: 42.6 % (ref 36.0–46.0)
Hemoglobin: 14.4 g/dL (ref 12.0–15.0)
Lymphocytes Relative: 32 % (ref 12–46)
Lymphs Abs: 2.7 10*3/uL (ref 0.7–4.0)
MCH: 29 pg (ref 26.0–34.0)
MCHC: 33.8 g/dL (ref 30.0–36.0)
MCV: 85.9 fL (ref 78.0–100.0)
Monocytes Absolute: 0.8 10*3/uL (ref 0.1–1.0)
Monocytes Relative: 9 % (ref 3–12)
Neutro Abs: 4.8 10*3/uL (ref 1.7–7.7)
Neutrophils Relative %: 57 % (ref 43–77)
Platelets: 194 10*3/uL (ref 150–400)
RBC: 4.96 MIL/uL (ref 3.87–5.11)
RDW: 13.7 % (ref 11.5–15.5)
WBC: 8.4 10*3/uL (ref 4.0–10.5)

## 2013-08-29 LAB — COMPLETE METABOLIC PANEL WITH GFR
ALT: 51 U/L — ABNORMAL HIGH (ref 0–35)
AST: 33 U/L (ref 0–37)
Albumin: 4.3 g/dL (ref 3.5–5.2)
Alkaline Phosphatase: 99 U/L (ref 39–117)
BUN: 18 mg/dL (ref 6–23)
CO2: 25 mEq/L (ref 19–32)
Calcium: 9.6 mg/dL (ref 8.4–10.5)
Chloride: 105 mEq/L (ref 96–112)
Creat: 1.03 mg/dL (ref 0.50–1.10)
GFR, Est African American: 77 mL/min
GFR, Est Non African American: 67 mL/min
Glucose, Bld: 94 mg/dL (ref 70–99)
Potassium: 4 mEq/L (ref 3.5–5.3)
Sodium: 138 mEq/L (ref 135–145)
Total Bilirubin: 0.4 mg/dL (ref 0.2–1.2)
Total Protein: 6.9 g/dL (ref 6.0–8.3)

## 2013-08-29 LAB — TSH: TSH: 1.383 u[IU]/mL (ref 0.350–4.500)

## 2013-08-29 LAB — SEDIMENTATION RATE: Sed Rate: 4 mm/hr (ref 0–22)

## 2013-08-31 LAB — CELIAC PANEL 10
Endomysial Screen: NEGATIVE
Gliadin IgA: 12.5 U/mL (ref <20)
Gliadin IgG: 41.6 U/mL — ABNORMAL HIGH (ref <20)
IgA: 313 mg/dL (ref 69–380)
Tissue Transglut Ab: 5.1 U/mL (ref <20)
Tissue Transglutaminase Ab, IgA: 2.7 U/mL (ref <20)

## 2013-10-16 ENCOUNTER — Other Ambulatory Visit: Payer: Self-pay | Admitting: Family Medicine

## 2013-10-16 NOTE — Telephone Encounter (Signed)
Refill appropriate and filled per protocol. 

## 2013-10-21 ENCOUNTER — Telehealth: Payer: Self-pay | Admitting: Family Medicine

## 2013-10-21 NOTE — Telephone Encounter (Signed)
Requesting refill on Xanax - ? OK to Refill  

## 2013-10-22 MED ORDER — ALPRAZOLAM 1 MG PO TABS: ORAL_TABLET | ORAL | Status: DC

## 2013-10-22 NOTE — Telephone Encounter (Signed)
ok 

## 2013-10-22 NOTE — Telephone Encounter (Signed)
Med called into pharm 

## 2013-11-13 ENCOUNTER — Other Ambulatory Visit: Payer: Self-pay | Admitting: Family Medicine

## 2013-11-13 NOTE — Telephone Encounter (Signed)
Refill appropriate and filled per protocol. 

## 2013-11-20 ENCOUNTER — Telehealth: Payer: Self-pay | Admitting: Family Medicine

## 2013-11-20 MED ORDER — ALPRAZOLAM 1 MG PO TABS: ORAL_TABLET | ORAL | Status: DC

## 2013-11-20 NOTE — Telephone Encounter (Signed)
Med called to pharm 

## 2013-11-20 NOTE — Telephone Encounter (Signed)
ok 

## 2013-11-20 NOTE — Telephone Encounter (Signed)
Requesting refill on Xanax - ? OK to Refill  

## 2013-12-10 ENCOUNTER — Other Ambulatory Visit: Payer: Self-pay | Admitting: Family Medicine

## 2013-12-10 MED ORDER — ACETAZOLAMIDE 250 MG PO TABS: 250.0000 mg | ORAL_TABLET | Freq: Two times a day (BID) | ORAL | Status: DC

## 2013-12-10 NOTE — Telephone Encounter (Signed)
Rx Refilled  

## 2013-12-18 ENCOUNTER — Telehealth: Payer: Self-pay | Admitting: Family Medicine

## 2013-12-18 MED ORDER — ALPRAZOLAM 1 MG PO TABS: ORAL_TABLET | ORAL | Status: DC

## 2013-12-18 NOTE — Telephone Encounter (Signed)
?   OK to Refill  

## 2013-12-18 NOTE — Telephone Encounter (Signed)
[facility] [address] [phone] Patient calling to get refill on her xanax she is changing to this pharmacy so she was told to call us since she changed the pharmacy

## 2013-12-18 NOTE — Telephone Encounter (Signed)
ok 

## 2013-12-18 NOTE — Telephone Encounter (Signed)
Med called to pharm 

## 2014-01-15 ENCOUNTER — Telehealth: Payer: Self-pay | Admitting: *Deleted

## 2014-01-15 ENCOUNTER — Other Ambulatory Visit: Payer: Self-pay | Admitting: Family Medicine

## 2014-01-15 MED ORDER — ALPRAZOLAM 1 MG PO TABS: ORAL_TABLET | ORAL | Status: DC

## 2014-01-15 NOTE — Telephone Encounter (Signed)
Last RF 6/26 #90  OK refill?

## 2014-01-15 NOTE — Telephone Encounter (Signed)
Per [facility] ok to refill and med called into pharm

## 2014-01-15 NOTE — Telephone Encounter (Signed)
Refill on ALPRAZolam (XANAX) 1 MG tablet   [facility] [address]

## 2014-01-18 NOTE — Telephone Encounter (Signed)
RX was called in Friday

## 2014-01-18 NOTE — Telephone Encounter (Signed)
What med?

## 2014-01-18 NOTE — Telephone Encounter (Signed)
Xanax 1mg 

## 2014-01-18 NOTE — Telephone Encounter (Signed)
ok 

## 2014-02-12 ENCOUNTER — Other Ambulatory Visit: Payer: Self-pay | Admitting: Family Medicine

## 2014-02-12 NOTE — Telephone Encounter (Signed)
Ok to refill??  Last office visit 08/28/2013.  Last refill 01/15/2014.

## 2014-02-12 NOTE — Telephone Encounter (Signed)
ok 

## 2014-02-12 NOTE — Telephone Encounter (Signed)
Medication called to pharmacy. 

## 2014-03-10 ENCOUNTER — Other Ambulatory Visit: Payer: Self-pay | Admitting: Obstetrics and Gynecology

## 2014-03-10 DIAGNOSIS — R928 Other abnormal and inconclusive findings on diagnostic imaging of breast: Secondary | ICD-10-CM

## 2014-03-15 ENCOUNTER — Other Ambulatory Visit: Payer: Self-pay | Admitting: Family Medicine

## 2014-03-15 NOTE — Telephone Encounter (Signed)
Last RF 02/12/14 #90.  Records show "dismissed" "do not schedule"  Due to bad debt.  OK refill?

## 2014-03-15 NOTE — Telephone Encounter (Signed)
RX called in .

## 2014-03-16 ENCOUNTER — Ambulatory Visit
Admission: RE | Admit: 2014-03-16 | Discharge: 2014-03-16 | Disposition: A | Discharge status: Home / Self Care | Payer: BC Managed Care – PPO | Source: Ambulatory Visit | Attending: Obstetrics and Gynecology | Admitting: Obstetrics and Gynecology

## 2014-03-16 DIAGNOSIS — R928 Other abnormal and inconclusive findings on diagnostic imaging of breast: Secondary | ICD-10-CM

## 2014-03-21 ENCOUNTER — Inpatient Hospital Stay (HOSPITAL_COMMUNITY): Payer: BC Managed Care – PPO

## 2014-03-21 ENCOUNTER — Encounter (HOSPITAL_COMMUNITY): Payer: Self-pay | Admitting: *Deleted

## 2014-03-21 ENCOUNTER — Inpatient Hospital Stay (HOSPITAL_COMMUNITY)
Admission: AD | Admit: 2014-03-21 | Discharge: 2014-03-21 | Disposition: A | Discharge status: Home / Self Care | Payer: BC Managed Care – PPO | Source: Ambulatory Visit | Attending: Obstetrics and Gynecology | Admitting: Obstetrics and Gynecology

## 2014-03-21 DIAGNOSIS — N920 Excessive and frequent menstruation with regular cycle: Secondary | ICD-10-CM | POA: Insufficient documentation

## 2014-03-21 DIAGNOSIS — A5901 Trichomonal vulvovaginitis: Secondary | ICD-10-CM | POA: Insufficient documentation

## 2014-03-21 DIAGNOSIS — A599 Trichomoniasis, unspecified: Secondary | ICD-10-CM

## 2014-03-21 LAB — CBC
HCT: 40 % (ref 36.0–46.0)
Hemoglobin: 13.2 g/dL (ref 12.0–15.0)
MCH: 29.5 pg (ref 26.0–34.0)
MCHC: 33 g/dL (ref 30.0–36.0)
MCV: 89.5 fL (ref 78.0–100.0)
Platelets: 154 10*3/uL (ref 150–400)
RBC: 4.47 MIL/uL (ref 3.87–5.11)
RDW: 13 % (ref 11.5–15.5)
WBC: 8.9 10*3/uL (ref 4.0–10.5)

## 2014-03-21 LAB — POCT PREGNANCY, URINE: Preg Test, Ur: NEGATIVE

## 2014-03-21 LAB — WET PREP, GENITAL: Yeast Wet Prep HPF POC: NONE SEEN

## 2014-03-21 LAB — HIV ANTIBODY (ROUTINE TESTING W REFLEX): HIV 1&2 Ab, 4th Generation: NONREACTIVE

## 2014-03-21 MED ORDER — METRONIDAZOLE 500 MG PO TABS
2000.0000 mg | ORAL_TABLET | Freq: Once | ORAL | Status: AC
  Administered 2014-03-21: 2000 mg via ORAL
  Filled 2014-03-21: qty 4

## 2014-03-21 MED ORDER — KETOROLAC TROMETHAMINE 60 MG/2ML IM SOLN
60.0000 mg | Freq: Once | INTRAMUSCULAR | Status: AC
  Administered 2014-03-21: 60 mg via INTRAMUSCULAR
  Filled 2014-03-21: qty 2

## 2014-03-21 MED ORDER — OXYCODONE-ACETAMINOPHEN 5-325 MG PO TABS: 1.0000 | ORAL_TABLET | Freq: Four times a day (QID) | ORAL | Status: DC | PRN

## 2014-03-21 NOTE — Discharge Instructions (Signed)

## 2014-03-21 NOTE — Progress Notes (Signed)
Margarita Mail CNM in to discuss test results and d/c plan with pt. Written and verbal d/c instructions given and understanding voiced

## 2014-03-21 NOTE — MAU Note (Signed)
Pt spoke with Dr Ellyn Hack Sat night and took 2 extra progesterone tabs at 2200

## 2014-03-21 NOTE — MAU Note (Signed)
5 months ago started having bleeding every day. Saw doctor and had u/s and tubes full of fld and endometrium thickened. Started on progesterone and bleeding stopped for few days and gradually bleeding has come back. Today and yesterday bleeding heavier and now using a pad every . Having sharp pain in lower abd

## 2014-03-21 NOTE — MAU Note (Signed)
Patient states is ok to talk and ask questions with pt's friend in room

## 2014-03-21 NOTE — MAU Provider Note (Signed)
History     CSN: [id number]  Arrival date and time: 03/21/14 4098   First Provider Initiated Contact with Patient 03/21/14 [phone]      Chief Complaint  Patient presents with  . Vaginal Bleeding  . Abdominal Pain   Vaginal Bleeding Associated symptoms include abdominal pain, headaches and nausea. Pertinent negatives include no vomiting.  Abdominal Pain Associated symptoms include headaches and nausea. Pertinent negatives include no vomiting.    Pt is a 43 yo female here with report of heavy bleeding that started in May of this year.  Prior to that cycles were regular occuring every 29-30 days.  Dr. Jackelyn Knife prescribed provera which helped for the first four days, however the bleeding has increased.  Pt reports needing to change pad every 30 minutes.  Also reports constant bilateral pelvic pain.  Pain is rated a 8/10 and described as sharp in nature.  States pain is constant.  Appointment in office tomorrow to schedule hysterectomy.  Reports last intercourse in November.     Past Medical History  Diagnosis Date  . Obesity   . Pseudotumor cerebri   . Migraine   . Tension headache   . Anxiety   . Depression   . Cancer     cervical  . Hypertension     Past Surgical History  Procedure Laterality Date  . Cryotherapy      Family History  Problem Relation Age of Onset  . Hypertension Mother   . Diabetes Sister   . Hypertension Sister     History  Substance Use Topics  . Smoking status: Never Smoker   . Smokeless tobacco: Not on file  . Alcohol Use: No    Allergies: No Known Allergies  Prescriptions prior to admission  Medication Sig Dispense Refill  . acetaZOLAMIDE (DIAMOX) 250 MG tablet Take 1 tablet (250 mg total) by mouth 2 (two) times daily.  60 tablet  3  . ALPRAZolam (XANAX) 1 MG tablet TAKE ONE TABLET BY MOUTH THREE TIMES DAILY AS NEEDED  90 tablet  0  . atenolol (TENORMIN) 100 MG tablet TAKE ONE TABLET BY MOUTH ONCE DAILY  30 tablet  3  . buPROPion  (WELLBUTRIN SR) 150 MG 12 hr tablet TAKE ONE TABLET BY MOUTH TWICE DAILY  60 tablet  5  . buPROPion (WELLBUTRIN SR) 150 MG 12 hr tablet Take 150 mg by mouth daily.      Marland Kitchen escitalopram (LEXAPRO) 10 MG tablet TAKE ONE TABLET BY MOUTH ONCE DAILY  30 tablet  6  . ibuprofen (ADVIL,MOTRIN) 200 MG tablet Take 600 mg by mouth every 6 (six) hours as needed.      . loratadine (CLARITIN) 10 MG tablet Take 10 mg by mouth daily.        . norethindrone (AYGESTIN) 5 MG tablet Take 5 mg by mouth daily.      . valACYclovir (VALTREX) 500 MG tablet Take 500 mg by mouth daily.          Review of Systems  Gastrointestinal: Positive for nausea and abdominal pain. Negative for vomiting.  Genitourinary: Positive for vaginal bleeding.       Vaginal bleeding  Neurological: Positive for dizziness and headaches.  All other systems reviewed and are negative.  Physical Exam   Blood pressure 148/76, pulse 69, temperature 98.8 F (37.1 C), resp. rate 18, height  (1.549 m), weight 121.02 kg (266 lb 12.8 oz), SpO2 100.00%.  Physical Exam  Constitutional: She is oriented to person, place, and time.  She appears well-developed and well-nourished. No distress.  HENT:  Head: Normocephalic.  Neck: Normal range of motion. Neck supple.  Cardiovascular: Normal rate and regular rhythm.   Respiratory: Effort normal and breath sounds normal.  GI: Soft. There is tenderness.  Genitourinary: Cervix exhibits no motion tenderness. There is bleeding (moderate bleeding, no clots) around the vagina. Vaginal discharge (white, creamy; frothy) found.  +fishy odor  Neurological: She is alert and oriented to person, place, and time.  Skin: Skin is warm and dry.    MAU Course  Procedures Results for orders placed during the hospital encounter of 03/21/14 (from the past 24 hour(s))  POCT PREGNANCY, URINE     Status: None   Collection Time    03/21/14  1:29 AM      Result Value Ref Range   Preg Test, Ur NEGATIVE  NEGATIVE  CBC      Status: None   Collection Time    03/21/14  1:55 AM      Result Value Ref Range   WBC 8.9  4.0 - 10.5 K/uL   RBC 4.47  3.87 - 5.11 MIL/uL   Hemoglobin 13.2  12.0 - 15.0 g/dL   HCT 69.6  29.5 - 28.4 %   MCV 89.5  78.0 - 100.0 fL   MCH 29.5  26.0 - 34.0 pg   MCHC 33.0  30.0 - 36.0 g/dL   RDW 13.2  44.0 - 10.2 %   Platelets 154  150 - 400 K/uL  WET PREP, GENITAL     Status: Abnormal   Collection Time    03/21/14  2:30 AM      Result Value Ref Range   Yeast Wet Prep HPF POC NONE SEEN  NONE SEEN   Trich, Wet Prep FEW (*) NONE SEEN   Clue Cells Wet Prep HPF POC FEW (*) NONE SEEN   WBC, Wet Prep HPF POC FEW (*) NONE SEEN   2 GM Flagyl in MAU  Consulted with Dr. Ellyn Hack > reviewed HPI/exam//labs/ultrasound report > no further treatment at this time, will send message to Dr. Jackelyn Knife regarding the infection  Assessment and Plan  Trichomoniasis Abnormal Vaginal Bleeding  Plan: Discharge to home GC/CT pending Continue provera Keep scheduled appointment for tomorrow Follow-up with office if symptoms worsen  Rochele Pages N 03/21/2014, 1:54 AM

## 2014-03-22 LAB — GC/CHLAMYDIA PROBE AMP
CT Probe RNA: NEGATIVE
GC Probe RNA: NEGATIVE

## 2014-04-08 ENCOUNTER — Encounter (HOSPITAL_COMMUNITY): Payer: Self-pay

## 2014-04-12 ENCOUNTER — Other Ambulatory Visit: Payer: Self-pay | Admitting: Family Medicine

## 2014-04-12 NOTE — Telephone Encounter (Signed)
Ok to refill??  Last office visit 08/28/2013.  Last refill 03/15/2014.

## 2014-04-12 NOTE — Patient Instructions (Addendum)
   [facility]  [facility] 930 am Pick up the phone at the desk and dial [phone] and inform us of your arrival.  Please call this number if you have any problems the morning of surgery: [phone]  Remember: Do not eat or drink after midnight: Tuesday Take these medicines the morning of surgery with a SIP OF WATER:  Diamox, atenolol, wellbutrin, lexapro and xanax if needed.  Do not wear jewelry, make-up, or FINGER nail polish No metal in your hair or on your body. Do not wear lotions, powders, perfumes.  You may wear deodorant.  Do not bring valuables to the hospital. Contacts, dentures or bridgework may not be worn into surgery.  Leave suitcase in the car. After Surgery it may be brought to your room. For patients being admitted to the hospital, checkout time is 11:00am the day of discharge.  Home with girlfriend Marisue HumbleKelly Christley cell [phone]

## 2014-04-12 NOTE — Telephone Encounter (Signed)
Medication called to pharmacy. 

## 2014-04-12 NOTE — Telephone Encounter (Signed)
ok 

## 2014-04-13 ENCOUNTER — Encounter (HOSPITAL_COMMUNITY): Payer: Self-pay

## 2014-04-13 ENCOUNTER — Encounter (HOSPITAL_COMMUNITY)
Admission: RE | Admit: 2014-04-13 | Discharge: 2014-04-13 | Disposition: A | Discharge status: Home / Self Care | Payer: BC Managed Care – PPO | Source: Ambulatory Visit | Attending: Obstetrics and Gynecology | Admitting: Obstetrics and Gynecology

## 2014-04-13 ENCOUNTER — Other Ambulatory Visit: Payer: Self-pay | Admitting: *Deleted

## 2014-04-13 DIAGNOSIS — Z01812 Encounter for preprocedural laboratory examination: Secondary | ICD-10-CM | POA: Insufficient documentation

## 2014-04-13 HISTORY — DX: Personal history of urinary calculi: Z87.442

## 2014-04-13 HISTORY — DX: Unspecified abnormal cytological findings in specimens from vagina: R87.629

## 2014-04-13 LAB — BASIC METABOLIC PANEL
Anion gap: 12 (ref 5–15)
BUN: 15 mg/dL (ref 6–23)
CO2: 21 mEq/L (ref 19–32)
Calcium: 9.2 mg/dL (ref 8.4–10.5)
Chloride: 105 mEq/L (ref 96–112)
Creatinine, Ser: 1.1 mg/dL (ref 0.50–1.10)
GFR calc Af Amer: 70 mL/min — ABNORMAL LOW (ref >90)
GFR calc non Af Amer: 61 mL/min — ABNORMAL LOW (ref >90)
Glucose, Bld: 146 mg/dL — ABNORMAL HIGH (ref 70–99)
Potassium: 4 mEq/L (ref 3.7–5.3)
Sodium: 138 mEq/L (ref 137–147)

## 2014-04-13 LAB — CBC
HCT: 42.1 % (ref 36.0–46.0)
Hemoglobin: 13.9 g/dL (ref 12.0–15.0)
MCH: 29.6 pg (ref 26.0–34.0)
MCHC: 33 g/dL (ref 30.0–36.0)
MCV: 89.6 fL (ref 78.0–100.0)
Platelets: 184 10*3/uL (ref 150–400)
RBC: 4.7 MIL/uL (ref 3.87–5.11)
RDW: 12.9 % (ref 11.5–15.5)
WBC: 8.4 10*3/uL (ref 4.0–10.5)

## 2014-04-13 MED ORDER — ACETAZOLAMIDE 250 MG PO TABS: 250.0000 mg | ORAL_TABLET | Freq: Two times a day (BID) | ORAL | Status: DC

## 2014-04-13 MED ORDER — ATENOLOL 100 MG PO TABS: ORAL_TABLET | ORAL | Status: DC

## 2014-04-13 NOTE — Telephone Encounter (Signed)
Received fax requesting refill on Atenolol and Diamox.   Refill appropriate and filled per protocol.

## 2014-04-20 MED ORDER — CEFAZOLIN SODIUM 10 G IJ SOLR
3.0000 g | INTRAMUSCULAR | Status: AC
  Administered 2014-04-21: 3 g via INTRAVENOUS
  Filled 2014-04-20: qty 3000

## 2014-04-20 NOTE — H&P (Signed)
Kimberly [name] is a 43yo, [id number], seen in September for annual exam. She was having regular menses every 40 days or so prior to May, but bleeding essentially daily since May, flow varies. Also having pelvic pain daily since May, mostly a dull ache, sometimes L>R. No specific aggravating or relieving factors, Advil takes the edge off.  Started on Aygestin to help with bleeding, still bleeding off and on.  Pelvic ultrasound normal with possible right hydrosalpinx.  Since bleeding and pain have persisted, she wants to proceed with definitive surgical therapy.  Pertinent Gynecological History: Last mammogram: abnormal: abnormal screening mammogram on left, normal diagnostic mammogram and ultrasound Date: 02/2014 Last pap: normal Date: 02/2014 OB History: G4, P3013 SVD at term x 3   Menstrual History: No LMP recorded.    Past Medical History  Diagnosis Date  . Obesity   . Pseudotumor cerebri   . Anxiety   . Depression   . Hypertension   . SVD (spontaneous vaginal delivery)     x 3  . History of kidney stones     passed stone - no surgery  . Migraine     otc med prn  . Tension headache   . Abnormal vaginal Pap smear     tx with cryotherapy    Past Surgical History  Procedure Laterality Date  . Cryotherapy    . Mandible fracture surgery  2002    x 1  . Wisdom tooth extraction    . Tubal ligation  11/2003    Family History  Problem Relation Age of Onset  . Hypertension Mother   . Diabetes Sister   . Hypertension Sister     Social History:  reports that she has never smoked. She has never used smokeless tobacco. She reports that she drinks about 4.2 ounces of alcohol per week. She reports that she does not use illicit drugs.  Allergies: No Known Allergies  No prescriptions prior to admission    Review of Systems  Respiratory: Negative.   Cardiovascular: Negative.   Gastrointestinal: Negative.   Genitourinary: Positive for frequency. Negative for dysuria and urgency.     There were no vitals taken for this visit. Physical Exam  Constitutional: She appears well-developed and well-nourished.  Neck: Neck supple. No thyromegaly present.  Cardiovascular: Normal rate, regular rhythm and normal heart sounds.   No murmur heard. Respiratory: Effort normal and breath sounds normal. No respiratory distress. She has no wheezes.  GI: Soft. She exhibits no distension and no mass.  Genitourinary: Vagina normal.  Uterus small, nontender No adnexal mass, mild bilateral adnexal tenderness    No results found for this or any previous visit (from the past 24 hour(s)).  No results found.  Assessment/Plan: Persistent abnormal bleeding despite treatment with Aygestin, and persistent pelvic pain with possible hydrosalpinx by ultrasound.  All medical and surgical options have been discussed, she wishes to proceed with definitive surgical therapy.  Surgical procedure, risks, chances of relieving bleeding and pain have all been discussed.  Will admit for LAVH, bilateral salpingectomy, possible cystoscopy.  Jayle Solarz D 04/20/2014, 7:35 PM

## 2014-04-21 ENCOUNTER — Ambulatory Visit (HOSPITAL_COMMUNITY): Payer: BC Managed Care – PPO | Admitting: Certified Registered Nurse Anesthetist

## 2014-04-21 ENCOUNTER — Encounter (HOSPITAL_COMMUNITY): Payer: BC Managed Care – PPO | Admitting: Certified Registered Nurse Anesthetist

## 2014-04-21 ENCOUNTER — Encounter (HOSPITAL_COMMUNITY)
Admission: RE | Disposition: A | Discharge status: Home / Self Care | Payer: Self-pay | Source: Ambulatory Visit | Attending: Obstetrics and Gynecology

## 2014-04-21 ENCOUNTER — Ambulatory Visit (HOSPITAL_COMMUNITY)
Admission: RE | Admit: 2014-04-21 | Discharge: 2014-04-22 | Disposition: A | Discharge status: Home / Self Care | Payer: BC Managed Care – PPO | Source: Ambulatory Visit | Attending: Obstetrics and Gynecology | Admitting: Obstetrics and Gynecology

## 2014-04-21 ENCOUNTER — Other Ambulatory Visit: Payer: Self-pay | Admitting: Family Medicine

## 2014-04-21 ENCOUNTER — Encounter (HOSPITAL_COMMUNITY): Payer: Self-pay | Admitting: Certified Registered Nurse Anesthetist

## 2014-04-21 DIAGNOSIS — F329 Major depressive disorder, single episode, unspecified: Secondary | ICD-10-CM | POA: Insufficient documentation

## 2014-04-21 DIAGNOSIS — I10 Essential (primary) hypertension: Secondary | ICD-10-CM | POA: Insufficient documentation

## 2014-04-21 DIAGNOSIS — R102 Pelvic and perineal pain: Secondary | ICD-10-CM | POA: Insufficient documentation

## 2014-04-21 DIAGNOSIS — N939 Abnormal uterine and vaginal bleeding, unspecified: Secondary | ICD-10-CM | POA: Diagnosis not present

## 2014-04-21 DIAGNOSIS — N800 Endometriosis of uterus: Secondary | ICD-10-CM | POA: Insufficient documentation

## 2014-04-21 DIAGNOSIS — Z87442 Personal history of urinary calculi: Secondary | ICD-10-CM | POA: Insufficient documentation

## 2014-04-21 DIAGNOSIS — F419 Anxiety disorder, unspecified: Secondary | ICD-10-CM | POA: Diagnosis not present

## 2014-04-21 HISTORY — PX: LAPAROSCOPIC ASSISTED VAGINAL HYSTERECTOMY: SHX5398

## 2014-04-21 LAB — PREGNANCY, URINE: Preg Test, Ur: NEGATIVE

## 2014-04-21 SURGERY — HYSTERECTOMY, VAGINAL, LAPAROSCOPY-ASSISTED
Anesthesia: General | Site: Abdomen | Laterality: Bilateral

## 2014-04-21 MED ORDER — SCOPOLAMINE 1 MG/3DAYS TD PT72
1.0000 | MEDICATED_PATCH | Freq: Once | TRANSDERMAL | Status: DC
  Administered 2014-04-21: 1.5 mg via TRANSDERMAL

## 2014-04-21 MED ORDER — PROPOFOL 10 MG/ML IV EMUL
INTRAVENOUS | Status: AC
  Filled 2014-04-21: qty 20

## 2014-04-21 MED ORDER — VASOPRESSIN 20 UNIT/ML IV SOLN
INTRAVENOUS | Status: AC
  Filled 2014-04-21: qty 1

## 2014-04-21 MED ORDER — MIDAZOLAM HCL 2 MG/2ML IJ SOLN
INTRAMUSCULAR | Status: DC | PRN
  Administered 2014-04-21: 2 mg via INTRAVENOUS

## 2014-04-21 MED ORDER — ACETAZOLAMIDE 250 MG PO TABS
250.0000 mg | ORAL_TABLET | Freq: Two times a day (BID) | ORAL | Status: DC
  Administered 2014-04-22: 250 mg via ORAL
  Filled 2014-04-21 (×3): qty 1

## 2014-04-21 MED ORDER — GLYCOPYRROLATE 0.2 MG/ML IJ SOLN
INTRAMUSCULAR | Status: AC
  Filled 2014-04-21: qty 1

## 2014-04-21 MED ORDER — ATENOLOL 25 MG PO TABS
25.0000 mg | ORAL_TABLET | Freq: Every day | ORAL | Status: DC
  Administered 2014-04-22: 25 mg via ORAL
  Filled 2014-04-21 (×2): qty 1

## 2014-04-21 MED ORDER — DEXTROSE-NACL 5-0.45 % IV SOLN
INTRAVENOUS | Status: DC
  Administered 2014-04-22: 06:00:00 via INTRAVENOUS

## 2014-04-21 MED ORDER — KETOROLAC TROMETHAMINE 30 MG/ML IJ SOLN: 30.0000 mg | Freq: Four times a day (QID) | INTRAMUSCULAR | Status: DC

## 2014-04-21 MED ORDER — OXYMETAZOLINE HCL 0.05 % NA SOLN
NASAL | Status: DC | PRN
  Administered 2014-04-21: 1 via NASAL

## 2014-04-21 MED ORDER — LORATADINE 10 MG PO TABS
10.0000 mg | ORAL_TABLET | Freq: Every day | ORAL | Status: DC | PRN
  Filled 2014-04-21: qty 1

## 2014-04-21 MED ORDER — MIDAZOLAM HCL 2 MG/2ML IJ SOLN
INTRAMUSCULAR | Status: AC
  Filled 2014-04-21: qty 2

## 2014-04-21 MED ORDER — KETOROLAC TROMETHAMINE 30 MG/ML IJ SOLN
30.0000 mg | Freq: Four times a day (QID) | INTRAMUSCULAR | Status: DC
  Administered 2014-04-21 – 2014-04-22 (×3): 30 mg via INTRAVENOUS
  Filled 2014-04-21 (×3): qty 1

## 2014-04-21 MED ORDER — HEPARIN SODIUM (PORCINE) 5000 UNIT/ML IJ SOLN
INTRAMUSCULAR | Status: AC
  Filled 2014-04-21: qty 1

## 2014-04-21 MED ORDER — ALUM & MAG HYDROXIDE-SIMETH 200-200-20 MG/5ML PO SUSP: 30.0000 mL | ORAL | Status: DC | PRN

## 2014-04-21 MED ORDER — ATENOLOL 100 MG PO TABS: ORAL_TABLET | ORAL | Status: DC

## 2014-04-21 MED ORDER — FENTANYL CITRATE 0.05 MG/ML IJ SOLN
INTRAMUSCULAR | Status: AC
  Administered 2014-04-21: 50 ug via INTRAVENOUS
  Filled 2014-04-21: qty 2

## 2014-04-21 MED ORDER — ROCURONIUM BROMIDE 100 MG/10ML IV SOLN
INTRAVENOUS | Status: DC | PRN
  Administered 2014-04-21: 60 mg via INTRAVENOUS

## 2014-04-21 MED ORDER — NEOSTIGMINE METHYLSULFATE 10 MG/10ML IV SOLN
INTRAVENOUS | Status: AC
  Filled 2014-04-21: qty 1

## 2014-04-21 MED ORDER — DEXAMETHASONE SODIUM PHOSPHATE 4 MG/ML IJ SOLN
INTRAMUSCULAR | Status: AC
  Filled 2014-04-21: qty 1

## 2014-04-21 MED ORDER — GLYCOPYRROLATE 0.2 MG/ML IJ SOLN
0.2000 mg | Freq: Once | INTRAMUSCULAR | Status: AC
  Administered 2014-04-21: 0.2 mg via INTRAVENOUS
  Filled 2014-04-21: qty 1

## 2014-04-21 MED ORDER — ONDANSETRON HCL 4 MG/2ML IJ SOLN
INTRAMUSCULAR | Status: DC | PRN
  Administered 2014-04-21: 4 mg via INTRAVENOUS

## 2014-04-21 MED ORDER — PROPOFOL 10 MG/ML IV BOLUS
INTRAVENOUS | Status: DC | PRN
  Administered 2014-04-21 (×2): 50 mg via INTRAVENOUS
  Administered 2014-04-21: 100 mg via INTRAVENOUS

## 2014-04-21 MED ORDER — SIMETHICONE 80 MG PO CHEW: 80.0000 mg | CHEWABLE_TABLET | Freq: Four times a day (QID) | ORAL | Status: DC | PRN

## 2014-04-21 MED ORDER — ONDANSETRON HCL 4 MG/2ML IJ SOLN: 4.0000 mg | Freq: Four times a day (QID) | INTRAMUSCULAR | Status: DC | PRN

## 2014-04-21 MED ORDER — ACETAZOLAMIDE 250 MG PO TABS
250.0000 mg | ORAL_TABLET | Freq: Once | ORAL | Status: AC
  Administered 2014-04-21: 250 mg via ORAL
  Filled 2014-04-21: qty 1

## 2014-04-21 MED ORDER — MENTHOL 3 MG MT LOZG: 1.0000 | LOZENGE | OROMUCOSAL | Status: DC | PRN

## 2014-04-21 MED ORDER — LIDOCAINE HCL (CARDIAC) 20 MG/ML IV SOLN
INTRAVENOUS | Status: DC | PRN
  Administered 2014-04-21: 100 mg via INTRAVENOUS

## 2014-04-21 MED ORDER — SODIUM CHLORIDE 0.9 % IJ SOLN
INTRAMUSCULAR | Status: AC
  Filled 2014-04-21: qty 50

## 2014-04-21 MED ORDER — FENTANYL CITRATE 0.05 MG/ML IJ SOLN
25.0000 ug | INTRAMUSCULAR | Status: DC | PRN
  Administered 2014-04-21 (×4): 50 ug via INTRAVENOUS

## 2014-04-21 MED ORDER — DIPHENHYDRAMINE HCL 12.5 MG/5ML PO ELIX: 12.5000 mg | ORAL_SOLUTION | Freq: Four times a day (QID) | ORAL | Status: DC | PRN

## 2014-04-21 MED ORDER — NEOSTIGMINE METHYLSULFATE 10 MG/10ML IV SOLN
INTRAVENOUS | Status: DC | PRN
  Administered 2014-04-21: 5 mg via INTRAVENOUS

## 2014-04-21 MED ORDER — INFLUENZA VAC SPLIT QUAD 0.5 ML IM SUSY
0.5000 mL | PREFILLED_SYRINGE | INTRAMUSCULAR | Status: AC
  Administered 2014-04-22: 0.5 mL via INTRAMUSCULAR
  Filled 2014-04-21: qty 0.5

## 2014-04-21 MED ORDER — LIDOCAINE HCL (CARDIAC) 20 MG/ML IV SOLN
INTRAVENOUS | Status: AC
  Filled 2014-04-21: qty 5

## 2014-04-21 MED ORDER — ACETAMINOPHEN 160 MG/5ML PO SOLN
975.0000 mg | Freq: Four times a day (QID) | ORAL | Status: DC | PRN
  Administered 2014-04-21: 975 mg via ORAL

## 2014-04-21 MED ORDER — ONDANSETRON HCL 4 MG PO TABS: 4.0000 mg | ORAL_TABLET | Freq: Four times a day (QID) | ORAL | Status: DC | PRN

## 2014-04-21 MED ORDER — DIPHENHYDRAMINE HCL 50 MG/ML IJ SOLN: 12.5000 mg | Freq: Four times a day (QID) | INTRAMUSCULAR | Status: DC | PRN

## 2014-04-21 MED ORDER — KETOROLAC TROMETHAMINE 30 MG/ML IJ SOLN: 30.0000 mg | Freq: Once | INTRAMUSCULAR | Status: DC

## 2014-04-21 MED ORDER — SODIUM CHLORIDE 0.9 % IJ SOLN
INTRAMUSCULAR | Status: AC
  Filled 2014-04-21: qty 10

## 2014-04-21 MED ORDER — ONDANSETRON HCL 4 MG/2ML IJ SOLN
INTRAMUSCULAR | Status: AC
  Filled 2014-04-21: qty 2

## 2014-04-21 MED ORDER — HYDROMORPHONE 0.3 MG/ML IV SOLN
INTRAVENOUS | Status: DC
  Administered 2014-04-21: 15:00:00 via INTRAVENOUS
  Administered 2014-04-21 (×2): 1.59 mg via INTRAVENOUS
  Administered 2014-04-22: 1.39 mg via INTRAVENOUS
  Administered 2014-04-22: 0.999 mg via INTRAVENOUS
  Administered 2014-04-22: 0.599 mg via INTRAVENOUS
  Filled 2014-04-21: qty 25

## 2014-04-21 MED ORDER — ACETAMINOPHEN 160 MG/5ML PO SOLN
ORAL | Status: AC
  Administered 2014-04-21: 975 mg via ORAL
  Filled 2014-04-21: qty 40.6

## 2014-04-21 MED ORDER — BUPROPION HCL ER (SR) 150 MG PO TB12
150.0000 mg | ORAL_TABLET | Freq: Two times a day (BID) | ORAL | Status: DC
  Administered 2014-04-21 – 2014-04-22 (×2): 150 mg via ORAL
  Filled 2014-04-21 (×2): qty 1

## 2014-04-21 MED ORDER — KETOROLAC TROMETHAMINE 30 MG/ML IJ SOLN
INTRAMUSCULAR | Status: AC
  Filled 2014-04-21: qty 1

## 2014-04-21 MED ORDER — BUPIVACAINE HCL (PF) 0.25 % IJ SOLN
INTRAMUSCULAR | Status: AC
  Filled 2014-04-21: qty 30

## 2014-04-21 MED ORDER — GLYCOPYRROLATE 0.2 MG/ML IJ SOLN
INTRAMUSCULAR | Status: AC
  Filled 2014-04-21: qty 4

## 2014-04-21 MED ORDER — ESMOLOL HCL 10 MG/ML IV SOLN
INTRAVENOUS | Status: AC
  Filled 2014-04-21: qty 10

## 2014-04-21 MED ORDER — GLYCOPYRROLATE 0.2 MG/ML IJ SOLN
INTRAMUSCULAR | Status: AC
  Filled 2014-04-21: qty 2

## 2014-04-21 MED ORDER — GLYCOPYRROLATE 0.2 MG/ML IJ SOLN
INTRAMUSCULAR | Status: DC | PRN
  Administered 2014-04-21: .8 mg via INTRAVENOUS
  Administered 2014-04-21: 0.2 mg via INTRAVENOUS

## 2014-04-21 MED ORDER — 0.9 % SODIUM CHLORIDE (POUR BTL) OPTIME
TOPICAL | Status: DC | PRN
  Administered 2014-04-21: 2000 mL

## 2014-04-21 MED ORDER — GLYCOPYRROLATE 0.2 MG/ML IJ SOLN
INTRAMUSCULAR | Status: AC
  Administered 2014-04-21: 0.2 mg via INTRAVENOUS
  Filled 2014-04-21: qty 2

## 2014-04-21 MED ORDER — IBUPROFEN 600 MG PO TABS: 600.0000 mg | ORAL_TABLET | Freq: Four times a day (QID) | ORAL | Status: DC | PRN

## 2014-04-21 MED ORDER — FENTANYL CITRATE 0.05 MG/ML IJ SOLN
INTRAMUSCULAR | Status: AC
Start: 2014-04-21 — End: 2014-04-21
  Filled 2014-04-21: qty 2

## 2014-04-21 MED ORDER — HYDROMORPHONE HCL 1 MG/ML IJ SOLN
INTRAMUSCULAR | Status: AC
  Filled 2014-04-21: qty 1

## 2014-04-21 MED ORDER — FENTANYL CITRATE 0.05 MG/ML IJ SOLN
INTRAMUSCULAR | Status: AC
  Filled 2014-04-21: qty 5

## 2014-04-21 MED ORDER — FENTANYL CITRATE 0.05 MG/ML IJ SOLN
INTRAMUSCULAR | Status: DC | PRN
  Administered 2014-04-21: 200 ug via INTRAVENOUS
  Administered 2014-04-21: 25 ug via INTRAVENOUS
  Administered 2014-04-21: 50 ug via INTRAVENOUS
  Administered 2014-04-21: 25 ug via INTRAVENOUS

## 2014-04-21 MED ORDER — ACETAZOLAMIDE 250 MG PO TABS: 250.0000 mg | ORAL_TABLET | Freq: Two times a day (BID) | ORAL | Status: DC

## 2014-04-21 MED ORDER — NALOXONE HCL 0.4 MG/ML IJ SOLN: 0.4000 mg | INTRAMUSCULAR | Status: DC | PRN

## 2014-04-21 MED ORDER — ESCITALOPRAM OXALATE 10 MG PO TABS
10.0000 mg | ORAL_TABLET | Freq: Every day | ORAL | Status: DC
  Administered 2014-04-22: 10 mg via ORAL
  Filled 2014-04-21: qty 1

## 2014-04-21 MED ORDER — BUPIVACAINE HCL (PF) 0.25 % IJ SOLN
INTRAMUSCULAR | Status: DC | PRN
Start: 2014-04-21 — End: 2014-04-21
  Administered 2014-04-21: 10 mL

## 2014-04-21 MED ORDER — PHENYLEPHRINE HCL 10 MG/ML IJ SOLN
INTRAMUSCULAR | Status: DC | PRN
  Administered 2014-04-21 (×3): 40 ug via INTRAVENOUS

## 2014-04-21 MED ORDER — OXYCODONE-ACETAMINOPHEN 5-325 MG PO TABS
1.0000 | ORAL_TABLET | ORAL | Status: DC | PRN
  Administered 2014-04-22: 1 via ORAL
  Filled 2014-04-21: qty 1

## 2014-04-21 MED ORDER — KETOROLAC TROMETHAMINE 30 MG/ML IJ SOLN
INTRAMUSCULAR | Status: DC | PRN
  Administered 2014-04-21: 30 mg via INTRAVENOUS

## 2014-04-21 MED ORDER — SODIUM CHLORIDE 0.9 % IJ SOLN: 9.0000 mL | INTRAMUSCULAR | Status: DC | PRN

## 2014-04-21 MED ORDER — DEXAMETHASONE SODIUM PHOSPHATE 10 MG/ML IJ SOLN
INTRAMUSCULAR | Status: DC | PRN
  Administered 2014-04-21: 4 mg via INTRAVENOUS

## 2014-04-21 MED ORDER — ROCURONIUM BROMIDE 100 MG/10ML IV SOLN
INTRAVENOUS | Status: AC
Start: 2014-04-21 — End: 2014-04-21
  Filled 2014-04-21: qty 1

## 2014-04-21 MED ORDER — OXYMETAZOLINE HCL 0.05 % NA SOLN
1.0000 | Freq: Once | NASAL | Status: DC
  Filled 2014-04-21: qty 15

## 2014-04-21 MED ORDER — PHENYLEPHRINE 40 MCG/ML (10ML) SYRINGE FOR IV PUSH (FOR BLOOD PRESSURE SUPPORT)
PREFILLED_SYRINGE | INTRAVENOUS | Status: AC
  Filled 2014-04-21: qty 5

## 2014-04-21 MED ORDER — METHYLENE BLUE 1 % INJ SOLN
INTRAMUSCULAR | Status: AC
  Filled 2014-04-21: qty 1

## 2014-04-21 MED ORDER — LACTATED RINGERS IV SOLN
INTRAVENOUS | Status: DC
  Administered 2014-04-21 (×3): via INTRAVENOUS

## 2014-04-21 MED ORDER — SCOPOLAMINE 1 MG/3DAYS TD PT72
MEDICATED_PATCH | TRANSDERMAL | Status: AC
  Administered 2014-04-21: 1.5 mg via TRANSDERMAL
  Filled 2014-04-21: qty 1

## 2014-04-21 MED ORDER — ALPRAZOLAM 0.5 MG PO TABS: 0.5000 mg | ORAL_TABLET | Freq: Two times a day (BID) | ORAL | Status: DC | PRN

## 2014-04-21 SURGICAL SUPPLY — 47 items
ADH SKN CLS APL DERMABOND .7 (GAUZE/BANDAGES/DRESSINGS) ×2
BLADE SURG 10 STRL SS (BLADE) ×3 IMPLANT
BLADE SURG 11 STRL SS (BLADE) ×6 IMPLANT
CANISTER SUCT 3000ML (MISCELLANEOUS) ×3 IMPLANT
CATH ROBINSON RED A/P 16FR (CATHETERS) ×3 IMPLANT
CLOTH BEACON ORANGE TIMEOUT ST (SAFETY) ×3 IMPLANT
CONT PATH 16OZ SNAP LID 3702 (MISCELLANEOUS) ×3 IMPLANT
COVER BACK TABLE 60X90IN (DRAPES) ×3 IMPLANT
COVER MAYO STAND STRL (DRAPES) ×2 IMPLANT
DECANTER SPIKE VIAL GLASS SM (MISCELLANEOUS) ×9 IMPLANT
DERMABOND ADVANCED (GAUZE/BANDAGES/DRESSINGS) ×1
DERMABOND ADVANCED .7 DNX12 (GAUZE/BANDAGES/DRESSINGS) ×2 IMPLANT
DRAPE HYSTEROSCOPY (DRAPE) ×3 IMPLANT
DRSG COVADERM PLUS 2X2 (GAUZE/BANDAGES/DRESSINGS) ×4 IMPLANT
DRSG OPSITE POSTOP 3X4 (GAUZE/BANDAGES/DRESSINGS) ×3 IMPLANT
DURAPREP 26ML APPLICATOR (WOUND CARE) ×3 IMPLANT
ELECT REM PT RETURN 9FT ADLT (ELECTROSURGICAL) ×3
ELECTRODE REM PT RTRN 9FT ADLT (ELECTROSURGICAL) ×1 IMPLANT
GLOVE BIO SURGEON STRL SZ8 (GLOVE) ×3 IMPLANT
GLOVE BIOGEL PI IND STRL 6.5 (GLOVE) ×2 IMPLANT
GLOVE BIOGEL PI IND STRL 7.0 (GLOVE) ×4 IMPLANT
GLOVE BIOGEL PI INDICATOR 6.5 (GLOVE) ×1
GLOVE BIOGEL PI INDICATOR 7.0 (GLOVE) ×2
GLOVE ORTHO TXT STRL SZ7.5 (GLOVE) ×6 IMPLANT
GOWN STRL REUS W/ TWL LRG LVL3 (GOWN DISPOSABLE) ×14 IMPLANT
GOWN STRL REUS W/TWL LRG LVL3 (GOWN DISPOSABLE) ×27 IMPLANT
LIQUID BAND (GAUZE/BANDAGES/DRESSINGS) ×3 IMPLANT
NEEDLE INSUFFLATION 120MM (ENDOMECHANICALS) ×3 IMPLANT
NS IRRIG 1000ML POUR BTL (IV SOLUTION) ×3 IMPLANT
PACK LAVH (CUSTOM PROCEDURE TRAY) ×3 IMPLANT
PAD TRENDELENBURG OR TABLE (MISCELLANEOUS) ×3 IMPLANT
PROTECTOR NERVE ULNAR (MISCELLANEOUS) ×6 IMPLANT
SET CYSTO W/LG BORE CLAMP LF (SET/KITS/TRAYS/PACK) ×3 IMPLANT
SET IRRIG TUBING LAPAROSCOPIC (IRRIGATION / IRRIGATOR) ×3 IMPLANT
SHEARS HARMONIC ACE PLUS 36CM (ENDOMECHANICALS) ×3 IMPLANT
SUT CHROMIC 1MO 4 18 CR8 (SUTURE) ×6 IMPLANT
SUT CHROMIC GUT AB #0 18 (SUTURE) ×3 IMPLANT
SUT SILK 2 0 SH (SUTURE) ×3 IMPLANT
SUT VIC AB 2-0 CT1 (SUTURE) ×3 IMPLANT
SUT VIC AB 2-0 CT1 27 (SUTURE) ×3
SUT VIC AB 2-0 CT1 TAPERPNT 27 (SUTURE) ×2 IMPLANT
SUT VICRYL 4-0 PS2 18IN ABS (SUTURE) ×3 IMPLANT
TOWEL OR 17X24 6PK STRL BLUE (TOWEL DISPOSABLE) ×6 IMPLANT
TRAY FOLEY CATH 14FR (SET/KITS/TRAYS/PACK) ×3 IMPLANT
TROCAR XCEL NON-BLD 5MMX100MML (ENDOMECHANICALS) ×12 IMPLANT
WARMER LAPAROSCOPE (MISCELLANEOUS) ×3 IMPLANT
WATER STERILE IRR 1000ML POUR (IV SOLUTION) ×3 IMPLANT

## 2014-04-21 NOTE — Op Note (Signed)
Preoperative diagnosis: AUB, pelvic pain Postoperative diagnosis:  Same Procedure: Laparoscopic-assisted vaginal hysterectomy, bilateral salpingectomy Surgeon: Lavina Hammanodd Rosibel Giacobbe M.D. Assistant: Sherron MondayJody Bovard, MD Anesthesia: Gen. Endotracheal tube Findings: She had a normal upper abdomen, essentially normal pelvis with evidence of previous BTL, hydrosalpinx of distal right tube. Estimated blood loss: 200 cc Specimens: Uterus, bilateral tubes sent for routine pathology Complications: None  Procedure in detail: The patient was taken to the operating room and placed in the dorsosupine position. General anesthesia was induced and legs were placed in mobile stirrups and arms tucked to her sides. Abdomen and perineum were then prepped and draped in usual sterile fashion, bladder drained with a red Robinson catheter, Hulka tenaculum applied to the cervix for uterine manipulation. Infraumbilical skin was then infiltrated with quarter percent Marcaine and a 1 cm vertical incision was made. Veress needle was inserted into the peritoneal cavity and placement confirmed by the water drop test an opening pressure of 6 mm mercury. CO2 was insufflated to a pressure of 13 mm mercury and the Veress needle was removed. A 5 mm trocar was introduced with direct visualization. A 5 mm port was then placed on each side also under direct visualization. Inspection revealed the above-mentioned findings.  The distal right tube was grasped and freed using the Harmonic scalpel ACE, placed in the posterior culdesac for removal later.  The Harmonic Scalpel ACE was then used to take down the right utero-ovarian pedicle, then came through the right round ligament, right broad ligament and incised the anterior peritoneum across the anterior lower part of the uterus. A similar procedure was then performed on the left side, removing the distal left tube, then taking down the utero-ovarian pedicle, round ligament, broad ligament, and anterior  peritoneum was incised across the anterior part of the uterus to meet the incision coming from the right side. At this point the uterus was fairly free and there is adequate hemostasis to proceed vaginally.  The legs were elevated in stirrups. A weighted speculum was inserted in the vagina. The cervix was grasped with Christella HartiganJacobs tenaculums. A dilute solution of Pitressin was infiltrated around the cervicovaginal junction which was then incised circumferentially with electrocautery. Sharp dissection was then used to further free the vagina from the cervix. Anterior peritoneum was identified and entered sharply. A Deaver retractor was then to retract the bladder anterior. Posterior cul-de-sac was identified and entered sharply. A Bonnano speculum was placed into the posterior cul-de-sac. Uterosacral ligaments were clamped transected and ligated with #1 chromic and tagged for later use. Cardinal ligaments and uterine arteries were likewise clamped transected and ligated with #1 chromic. The remaining pedicles were clamped transected and ligated and the uterus was removed. Bleeding from each side was controlled with figure 8 #1 Chromic. The uterosacral ligaments were plicated in the midline with 2-0 silk and the previously tagged uterosacral pedicles were also tied in the midline. The vaginal cuff was then closed in a vertical fashion with running locking 2-0 Vicryl with adequate closure and adequate hemostasis. A Foley catheter was then placed.  Attention was turned back to the abdomen. The scrub tech and myself changed gloves. The abdomen was reinsufflated. Laparoscope was reinserted and good visualization was achieved. No significant bleeding was identified. Both ureters were identified and found to be below incision lines. The 5 mm ports on the each side were removed under direct visualization. The laparoscope was then removed and all gas was allowed to deflate from the abdomen. The umbilical trocar was then  removed. Skin  incisions were closed with interrupted subcuticular sutures of 4-0 Vicryl followed by Dermabond. The patient was awakened in the operating room and taken to the recovery room in stable condition after tolerating the procedure well. Counts were correct x2, she received Ancef 3 g IV the beginning of the procedure, she had PAS hose on throughout the procedure.

## 2014-04-21 NOTE — Progress Notes (Signed)
Patient ID: Kimberly CousinDanielle R Closson, female   DOB: September 16, 1970, 43 y.o.   MRN: [id number] DOS Pt is awake and alert. VS are stable. Her foley bag is full of yellow urine

## 2014-04-21 NOTE — Interval H&P Note (Signed)
History and Physical Interval Note:  04/21/2014 10:49 AM  Kimberly [name]  has presented today for surgery, with the diagnosis of AUB, Pelvic Pain, [id number], [id number]  The various methods of treatment have been discussed with the patient and family. After consideration of risks, benefits and other options for treatment, the patient has consented to  Procedure(s): LAPAROSCOPIC ASSISTED VAGINAL HYSTERECTOMY, BILATERAL SALPINGECTOMY (Bilateral) CYSTOSCOPY (N/A) as a surgical intervention .  The patient's history has been reviewed, patient examined, no change in status, stable for surgery.  I have reviewed the patient's chart and labs.  Questions were answered to the patient's satisfaction.     [name] D

## 2014-04-21 NOTE — Transfer of Care (Signed)
Immediate Anesthesia Transfer of Care Note  Patient: Kimberly [name]  Procedure(s) Performed: Procedure(s): LAPAROSCOPIC ASSISTED VAGINAL HYSTERECTOMY, BILATERAL SALPINGECTOMY (Bilateral)  Patient Location: PACU  Anesthesia Type:General  Level of Consciousness: awake, alert , oriented and patient cooperative  Airway & Oxygen Therapy: Patient Spontanous Breathing and Patient connected to nasal cannula oxygen  Post-op Assessment: Report given to PACU RN and Post -op Vital signs reviewed and stable  Post vital signs: Reviewed and stable  Complications: No apparent anesthesia complications

## 2014-04-21 NOTE — Anesthesia Preprocedure Evaluation (Addendum)
Anesthesia Evaluation  Patient identified by MRN, date of birth, ID band Patient awake    Reviewed: Allergy & Precautions, H&P , Patient's Chart, lab work & pertinent test results, reviewed documented beta blocker date and time   Airway Mallampati: IV  TM Distance: >3 FB Neck ROM: full  Mouth opening: Limited Mouth Opening  Dental no notable dental hx.    Pulmonary  breath sounds clear to auscultation  Pulmonary exam normal       Cardiovascular hypertension, On Medications Rhythm:regular Rate:Normal     Neuro/Psych  Headaches,    GI/Hepatic   Endo/Other  Morbid obesity  Renal/GU      Musculoskeletal   Abdominal   Peds  Hematology   Anesthesia Other Findings   Reproductive/Obstetrics                            Anesthesia Physical Anesthesia Plan  ASA: III  Anesthesia Plan: General   Post-op Pain Management:    Induction: Intravenous and Inhalational  Airway Management Planned: Oral ETT, LMA, LMA C-Track Planned, Fiberoptic Intubation Planned and Video Laryngoscope Planned  Additional Equipment:   Intra-op Plan:   Post-operative Plan: Extubation in OR and Possible Post-op intubation/ventilation  Informed Consent: I have reviewed the patients History and Physical, chart, labs and discussed the procedure including the risks, benefits and alternatives for the proposed anesthesia with the patient or authorized representative who has indicated his/her understanding and acceptance.   Dental Advisory Given and Dental advisory given  Plan Discussed with: CRNA and Surgeon  Anesthesia Plan Comments: (Significant concern of airway with obesity and large weight gain since last GA 12-14 yr ago. Patient has small mouth and large neck. I will dry her airway and plan spont ventilation and take a look . This might require multiple techniques to achieve ETT. Pt took BBlockers this am. No increased  ICP Sx.  Discussed general anesthesia, including possible nausea, instrumentation of airway, sore throat,pulmonary aspiration, etc. I asked if the were any outstanding questions, or  concerns before we proceeded. )        Anesthesia Quick Evaluation

## 2014-04-22 ENCOUNTER — Encounter (HOSPITAL_COMMUNITY): Payer: Self-pay | Admitting: Obstetrics and Gynecology

## 2014-04-22 DIAGNOSIS — R102 Pelvic and perineal pain: Secondary | ICD-10-CM | POA: Diagnosis not present

## 2014-04-22 LAB — CBC
HCT: 35.8 % — ABNORMAL LOW (ref 36.0–46.0)
Hemoglobin: 11.5 g/dL — ABNORMAL LOW (ref 12.0–15.0)
MCH: 29.2 pg (ref 26.0–34.0)
MCHC: 32.1 g/dL (ref 30.0–36.0)
MCV: 90.9 fL (ref 78.0–100.0)
Platelets: 146 10*3/uL — ABNORMAL LOW (ref 150–400)
RBC: 3.94 MIL/uL (ref 3.87–5.11)
RDW: 13.1 % (ref 11.5–15.5)
WBC: 10.1 10*3/uL (ref 4.0–10.5)

## 2014-04-22 MED ORDER — OXYCODONE-ACETAMINOPHEN 5-325 MG PO TABS: 1.0000 | ORAL_TABLET | ORAL | Status: DC | PRN

## 2014-04-22 MED ORDER — IBUPROFEN 600 MG PO TABS: 600.0000 mg | ORAL_TABLET | Freq: Four times a day (QID) | ORAL | Status: DC | PRN

## 2014-04-22 NOTE — Discharge Instructions (Signed)
Routine instructions for hysterectomy °

## 2014-04-22 NOTE — Anesthesia Postprocedure Evaluation (Signed)
  Anesthesia Post-op Note  Anesthesia Post Note  Patient: Kimberly [name]  Procedure(s) Performed: Procedure(s) (LRB): LAPAROSCOPIC ASSISTED VAGINAL HYSTERECTOMY, BILATERAL SALPINGECTOMY (Bilateral)  Anesthesia type: General  Patient location: [facility]  Post pain: Pain level controlled  Post assessment: Post-op Vital signs reviewed  Last Vitals:  Filed Vitals:   04/22/14 0539  BP: 115/54  Pulse: 75  Temp: 36.8 C  Resp: 15    Post vital signs: Reviewed  Level of consciousness: sedated  Complications: No apparent anesthesia complications

## 2014-04-22 NOTE — Progress Notes (Signed)
1 Day Post-Op Procedure(s) (LRB): LAPAROSCOPIC ASSISTED VAGINAL HYSTERECTOMY, BILATERAL SALPINGECTOMY (Bilateral)  Subjective: Patient reports incisional pain and tolerating PO.    Objective: I have reviewed patient's vital signs, intake and output, medications and labs.  General: alert GI: soft, dressings C/D/I  Assessment: s/p Procedure(s): LAPAROSCOPIC ASSISTED VAGINAL HYSTERECTOMY, BILATERAL SALPINGECTOMY (Bilateral): stable, progressing well and tolerating diet  Plan: Advance diet Encourage ambulation Advance to PO medication Discontinue IV fluids Discharge home  LOS: 1 day    Tranika Scholler D 04/22/2014, 8:10 AM

## 2014-04-22 NOTE — Anesthesia Postprocedure Evaluation (Addendum)
  Anesthesia Post-op Note  Patient: Kimberly [name]  Procedure(s) Performed: Procedure(s): LAPAROSCOPIC ASSISTED VAGINAL HYSTERECTOMY, BILATERAL SALPINGECTOMY (Bilateral) Patient is awake and responsive. Pain and nausea are reasonably [name] controlled. Vital signs are stable and clinically acceptable. Oxygen saturation is clinically acceptable. There are no apparent anesthetic complications at this time. Patient is ready for discharge.

## 2014-04-23 NOTE — Discharge Summary (Signed)
Physician Discharge Summary  Patient ID: Kimberly [name] MRN: [id number] DOB/AGE: 10-29-1970 43 y.o.  Admit date: 04/21/2014 Discharge date: 04/23/2014  Admission Diagnoses:  AUB, pelvic pain  Discharge Diagnoses:  AUB, pelvic pain Active Problems:   Abnormal uterine bleeding unrelated to menstrual cycle   Discharged Condition: good  Hospital Course: Admitted for LAVH/bilateral salpingectomy, no intra-op complications.  Dis well post-op without problems.  Discharge Exam: Blood pressure 108/52, pulse 79, temperature 98.8 F (37.1 C), temperature source Oral, resp. rate 18, height 5' 0.5" (1.537 m), weight 120.203 kg (265 lb), last menstrual period 09/23/2013, SpO2 98.00%. General appearance: alert  Disposition: 01-Home or Self Care  Discharge Instructions   Diet - low sodium heart healthy    Complete by:  As directed      Increase activity slowly    Complete by:  As directed      Lifting restrictions    Complete by:  As directed   10 lbs     Sexual Activity Restrictions    Complete by:  As directed   Pelvic rest            Medication List         acetaZOLAMIDE 250 MG tablet  Commonly known as:  DIAMOX  Take 1 tablet (250 mg total) by mouth 2 (two) times daily.     ALPRAZolam 1 MG tablet  Commonly known as:  XANAX  TAKE ONE TABLET BY MOUTH THREE TIMES DAILY AS NEEDED     atenolol 100 MG tablet  Commonly known as:  TENORMIN  TAKE ONE TABLET BY MOUTH ONCE DAILY     buPROPion 150 MG 12 hr tablet  Commonly known as:  WELLBUTRIN SR  Take 150 mg by mouth daily. This is once daily.     buPROPion 150 MG 12 hr tablet  Commonly known as:  WELLBUTRIN SR  TAKE ONE TABLET BY MOUTH TWICE DAILY     escitalopram 10 MG tablet  Commonly known as:  LEXAPRO  TAKE ONE TABLET BY MOUTH ONCE DAILY     ibuprofen 600 MG tablet  Commonly known as:  ADVIL,MOTRIN  Take 600 mg by mouth 3 (three) times daily.     ibuprofen 600 MG tablet  Commonly known as:  ADVIL,MOTRIN   Take 1 tablet (600 mg total) by mouth every 6 (six) hours as needed (mild pain).     loratadine 10 MG tablet  Commonly known as:  CLARITIN  Take 10 mg by mouth daily as needed.     norethindrone 5 MG tablet  Commonly known as:  AYGESTIN  Take 5 mg by mouth daily.     oxyCODONE-acetaminophen 5-325 MG per tablet  Commonly known as:  PERCOCET/ROXICET  Take 1-2 tablets by mouth every 4 (four) hours as needed for severe pain (moderate to severe pain (when tolerating fluids)).     phenylephrine 10 MG Tabs tablet  Commonly known as:  SUDAFED PE  Take 10 mg by mouth daily.           Follow-up Information   Follow up with Alesha Jaffee D, MD. Schedule an appointment as soon as possible for a visit in 2 weeks.   Specialty:  Obstetrics and Gynecology   Contact information:   [address], [address] [place] [address] [phone]       Signed: Zenaida NieceMEISINGER,Marylynne Keelin D 04/23/2014, 8:24 AM

## 2014-04-26 ENCOUNTER — Encounter (HOSPITAL_COMMUNITY): Payer: Self-pay | Admitting: Obstetrics and Gynecology

## 2014-05-11 ENCOUNTER — Encounter: Payer: Self-pay | Admitting: Family Medicine

## 2014-05-11 ENCOUNTER — Other Ambulatory Visit: Payer: Self-pay | Admitting: Family Medicine

## 2014-05-11 NOTE — Telephone Encounter (Signed)
Last Rf 10/19 #90 no refills.  Last OV 08/28/13  Account on hold due to bad debt.  OK refill?

## 2014-05-11 NOTE — Telephone Encounter (Signed)
Please let pt know we will give her 1 more refill, she needs an OV to f/u her meds with Dr. Tanya NonesPickard, if she is unable to come in we will need to taper off medication, we can not continue to give her benzos without her being seen

## 2014-05-11 NOTE — Telephone Encounter (Signed)
RX called to pharmacy and patient sent letter that she must be seen.

## 2014-06-08 ENCOUNTER — Encounter: Payer: Self-pay | Admitting: Family Medicine

## 2014-06-08 ENCOUNTER — Ambulatory Visit (INDEPENDENT_AMBULATORY_CARE_PROVIDER_SITE_OTHER): Payer: BC Managed Care – PPO | Admitting: Family Medicine

## 2014-06-08 VITALS — BP 124/86 | HR 64 | Temp 98.1°F | Resp 18 | Wt 264.0 lb

## 2014-06-08 DIAGNOSIS — F329 Major depressive disorder, single episode, unspecified: Secondary | ICD-10-CM

## 2014-06-08 DIAGNOSIS — D62 Acute posthemorrhagic anemia: Secondary | ICD-10-CM

## 2014-06-08 DIAGNOSIS — F32A Depression, unspecified: Secondary | ICD-10-CM

## 2014-06-08 DIAGNOSIS — I10 Essential (primary) hypertension: Secondary | ICD-10-CM

## 2014-06-08 MED ORDER — ALPRAZOLAM 1 MG PO TABS: 1.0000 mg | ORAL_TABLET | Freq: Three times a day (TID) | ORAL | Status: DC | PRN

## 2014-06-08 MED ORDER — BUPROPION HCL ER (SR) 150 MG PO TB12: 150.0000 mg | ORAL_TABLET | Freq: Every day | ORAL | Status: DC

## 2014-06-08 MED ORDER — ACETAZOLAMIDE 250 MG PO TABS: 250.0000 mg | ORAL_TABLET | Freq: Two times a day (BID) | ORAL | Status: DC

## 2014-06-08 NOTE — Progress Notes (Signed)
Subjective:    Patient ID: Kimberly [name], female    DOB: Feb 10, 1971, 43 y.o.   MRN: [id number]  HPI Patient is here today for follow-up of her anxiety and depression and hypertension. Her blood pressure is currently well controlled at 124/86. She is currently taking atenolol 100 mg by mouth daily. She does report snoring on a nightly basis. Close friends have witnessed her have apneic episodes and make strange noises in her breathing when she sleeps. The patient reports hypersomnolence and fatigue. Patient's electrolytes were checked in October when she was hospitalized for hysterectomy. Her last cholesterol was checked last year in her LDL was within normal limits. She denies any chest pain shortness of breath or dyspnea on exertion. She did have anemia postoperatively after her hysterectomy due to abnormal uterine bleeding. She is overdue to have a repeat of her CBC. Her abnormal uterine bleeding has completely stopped.  Her depression is much better on the combination of Lexapro and Wellbutrin. She is requesting a refill on her alprazolam. Past Medical History  Diagnosis Date  . Obesity   . Pseudotumor cerebri   . Anxiety   . Depression   . Hypertension   . SVD (spontaneous vaginal delivery)     x 3  . History of kidney stones     passed stone - no surgery  . Migraine     otc med prn  . Tension headache   . Abnormal vaginal Pap smear     tx with cryotherapy   Past Surgical History  Procedure Laterality Date  . Cryotherapy    . Mandible fracture surgery  2002    x 1  . Wisdom tooth extraction    . Tubal ligation  11/2003  . Laparoscopic assisted vaginal hysterectomy Bilateral 04/21/2014    Procedure: LAPAROSCOPIC ASSISTED VAGINAL HYSTERECTOMY, BILATERAL SALPINGECTOMY;  Surgeon: Lavina Hamman, MD;  Location: [facility];  Service: Gynecology;  Laterality: Bilateral;   Current Outpatient Prescriptions on File Prior to Visit  Medication Sig Dispense Refill  . atenolol (TENORMIN)  100 MG tablet TAKE ONE TABLET BY MOUTH ONCE DAILY 30 tablet 3  . escitalopram (LEXAPRO) 10 MG tablet TAKE ONE TABLET BY MOUTH ONCE DAILY 30 tablet 6  . ibuprofen (ADVIL,MOTRIN) 600 MG tablet Take 1 tablet (600 mg total) by mouth every 6 (six) hours as needed (mild pain). 30 tablet 0  . loratadine (CLARITIN) 10 MG tablet Take 10 mg by mouth daily as needed.     . phenylephrine (SUDAFED PE) 10 MG TABS tablet Take 10 mg by mouth daily.     No current facility-administered medications on file prior to visit.   No Known Allergies History   Social History  . Marital Status: Legally Separated    Spouse Name: N/A    Number of Children: N/A  . Years of Education: N/A   Occupational History  . Not on file.   Social History Main Topics  . Smoking status: Never Smoker   . Smokeless tobacco: Never Used  . Alcohol Use: 4.2 oz/week    7 Glasses of wine per week  . Drug Use: No  . Sexual Activity: Yes    Birth Control/ Protection: Surgical   Other Topics Concern  . Not on file   Social History Narrative      Review of Systems  All other systems reviewed and are negative.      Objective:   Physical Exam  Constitutional: She appears well-developed and well-nourished.  Cardiovascular: Normal rate,  regular rhythm and normal heart sounds.   No murmur heard. Pulmonary/Chest: Effort normal and breath sounds normal. No respiratory distress. She has no wheezes. She has no rales. She exhibits no tenderness.  Abdominal: Soft. Bowel sounds are normal. She exhibits no distension. There is no tenderness. There is no rebound and no guarding.  Psychiatric: She has a normal mood and affect. Her behavior is normal. Judgment and thought content normal.  Vitals reviewed.         Assessment & Plan:  Acute post-hemorrhagic anemia - Plan: CBC with Differential  Essential hypertension  Depression  I will make no changes in her Wellbutrin and Lexapro. Currently her depression is well  controlled. Her anxiety is satisfactorily managed with Xanax. I recommended that she try to decrease the amount of Xanax that she is taking and gradually wean down on the medication. I'll repeat a CBC to monitor her anemia. Her blood pressure is well controlled. I did suggest that we schedule the patient for a split-level sleep study to evaluate for obstructive sleep apnea. The patient would like to wait until after Christmas when her schedule improves. She will call me when she wants me to schedule her for this.

## 2014-06-09 LAB — CBC WITH DIFFERENTIAL/PLATELET
Basophils Absolute: 0 10*3/uL (ref 0.0–0.1)
Basophils Relative: 0 % (ref 0–1)
Eosinophils Absolute: 0.1 10*3/uL (ref 0.0–0.7)
Eosinophils Relative: 1 % (ref 0–5)
HCT: 41.3 % (ref 36.0–46.0)
Hemoglobin: 13.8 g/dL (ref 12.0–15.0)
Lymphocytes Relative: 28 % (ref 12–46)
Lymphs Abs: 2.4 10*3/uL (ref 0.7–4.0)
MCH: 28.6 pg (ref 26.0–34.0)
MCHC: 33.4 g/dL (ref 30.0–36.0)
MCV: 85.7 fL (ref 78.0–100.0)
MPV: 11.5 fL (ref 9.4–12.4)
Monocytes Absolute: 0.6 10*3/uL (ref 0.1–1.0)
Monocytes Relative: 7 % (ref 3–12)
Neutro Abs: 5.4 10*3/uL (ref 1.7–7.7)
Neutrophils Relative %: 64 % (ref 43–77)
Platelets: 219 10*3/uL (ref 150–400)
RBC: 4.82 MIL/uL (ref 3.87–5.11)
RDW: 13.8 % (ref 11.5–15.5)
WBC: 8.5 10*3/uL (ref 4.0–10.5)

## 2014-07-20 ENCOUNTER — Encounter: Payer: Self-pay | Admitting: Family Medicine

## 2014-07-28 ENCOUNTER — Other Ambulatory Visit: Payer: Self-pay | Admitting: Family Medicine

## 2014-07-28 MED ORDER — ESCITALOPRAM OXALATE 10 MG PO TABS: 10.0000 mg | ORAL_TABLET | Freq: Every day | ORAL | Status: DC

## 2014-07-28 NOTE — Telephone Encounter (Signed)
Med sent to pharm 

## 2014-08-01 ENCOUNTER — Other Ambulatory Visit: Payer: Self-pay | Admitting: Family Medicine

## 2014-08-27 ENCOUNTER — Other Ambulatory Visit: Payer: Self-pay | Admitting: Family Medicine

## 2014-08-27 ENCOUNTER — Encounter: Payer: Self-pay | Admitting: Family Medicine

## 2014-08-27 NOTE — Telephone Encounter (Signed)
ok 

## 2014-08-27 NOTE — Telephone Encounter (Signed)
Ok to refill??  Last office visit/ refill 06/08/2014, #2 refills.

## 2014-08-31 NOTE — Telephone Encounter (Signed)
Medication called to pharmacy. 

## 2014-09-27 ENCOUNTER — Encounter: Payer: Self-pay | Admitting: Family Medicine

## 2014-10-14 ENCOUNTER — Telehealth: Payer: Self-pay | Admitting: *Deleted

## 2014-10-14 NOTE — Telephone Encounter (Signed)
Ok with refills, she has never done anything like this before.
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
## 2014-10-14 NOTE — Telephone Encounter (Signed)
Pharmacy aware of ok for early refill and pt is aware as well
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
## 2014-10-28 ENCOUNTER — Other Ambulatory Visit: Payer: Self-pay | Admitting: Family Medicine

## 2014-10-29 NOTE — Telephone Encounter (Signed)
ok 

## 2014-10-29 NOTE — Telephone Encounter (Signed)
?   OK to Refill  

## 2014-11-01 NOTE — Telephone Encounter (Signed)
ok 

## 2014-11-02 ENCOUNTER — Telehealth: Payer: Self-pay | Admitting: Family Medicine

## 2014-11-02 NOTE — Telephone Encounter (Signed)
Pt very stressed at work.  Having a lot of anxiety.  Has had to leave to go home.  Feel BP may be up.  Has taken BP med today but not prn Xanax.  Told pt to go home and try to relax.  BP at this point with her anxiety level will probably be up.  Go home take one of her Xanax and rest.  If still feeling anxious this afternoon, call back.

## 2014-11-23 ENCOUNTER — Other Ambulatory Visit: Payer: Self-pay | Admitting: Family Medicine

## 2014-12-11 ENCOUNTER — Other Ambulatory Visit: Payer: Self-pay | Admitting: Family Medicine

## 2015-01-13 ENCOUNTER — Ambulatory Visit: Payer: Self-pay | Admitting: Physician Assistant

## 2015-01-13 ENCOUNTER — Other Ambulatory Visit: Payer: Self-pay | Admitting: Family Medicine

## 2015-01-18 ENCOUNTER — Encounter: Payer: Self-pay | Admitting: Family Medicine

## 2015-01-18 ENCOUNTER — Ambulatory Visit (INDEPENDENT_AMBULATORY_CARE_PROVIDER_SITE_OTHER): Payer: BLUE CROSS/BLUE SHIELD | Admitting: Family Medicine

## 2015-01-18 VITALS — BP 132/90 | HR 80 | Temp 98.3°F | Resp 16 | Ht 61.0 in | Wt 262.0 lb

## 2015-01-18 DIAGNOSIS — R739 Hyperglycemia, unspecified: Secondary | ICD-10-CM

## 2015-01-18 DIAGNOSIS — IMO0002 Reserved for concepts with insufficient information to code with codable children: Secondary | ICD-10-CM

## 2015-01-18 DIAGNOSIS — E1165 Type 2 diabetes mellitus with hyperglycemia: Secondary | ICD-10-CM

## 2015-01-18 LAB — HEMOGLOBIN A1C, FINGERSTICK: Hgb A1C (fingerstick): 9.8 % — ABNORMAL HIGH (ref <5.7)

## 2015-01-18 MED ORDER — GLIPIZIDE ER 10 MG PO TB24: 10.0000 mg | ORAL_TABLET | Freq: Every day | ORAL | Status: DC

## 2015-01-18 MED ORDER — METFORMIN HCL 1000 MG PO TABS: 1000.0000 mg | ORAL_TABLET | Freq: Two times a day (BID) | ORAL | Status: DC

## 2015-01-18 NOTE — Progress Notes (Signed)
Subjective:    Patient ID: Kimberly [name], female    DOB: Jul 12, 1970, 44 y.o.   MRN: [id number]  HPI  Patient reports one month of polyuria, polydipsia, blurred vision. She also reports chronic severe fatigue and hypersomnia. Recently she had her friend check her blood sugar and was found to be greater than 400. Here today in clinic a fingerstick hemoglobin A1c was elevated at 9.8. She also reports loud snoring. She reports a chronic daily morning headache. She reports hypersomnia. She can fall asleep while driving or at stop lights. Past Medical History  Diagnosis Date  . Obesity   . Pseudotumor cerebri   . Anxiety   . Depression   . Hypertension   . SVD (spontaneous vaginal delivery)     x 3  . History of kidney stones     passed stone - no surgery  . Migraine     otc med prn  . Tension headache   . Abnormal vaginal Pap smear     tx with cryotherapy   Past Surgical History  Procedure Laterality Date  . Cryotherapy    . Mandible fracture surgery  2002    x 1  . Wisdom tooth extraction    . Tubal ligation  11/2003  . Laparoscopic assisted vaginal hysterectomy Bilateral 04/21/2014    Procedure: LAPAROSCOPIC ASSISTED VAGINAL HYSTERECTOMY, BILATERAL SALPINGECTOMY;  Surgeon: Lavina Hamman, MD;  Location: [facility];  Service: Gynecology;  Laterality: Bilateral;   Current Outpatient Prescriptions on File Prior to Visit  Medication Sig Dispense Refill  . acetaZOLAMIDE (DIAMOX) 250 MG tablet TAKE 1 TABLET (250 MG TOTAL) BY MOUTH 2 (TWO) TIMES DAILY. 60 tablet 3  . ALPRAZolam (XANAX) 1 MG tablet TAKE 1 TABLET BY MOUTH 3 TIMES A DAY AS NEEDED FOR ANXIETY (FILL DATE September 07, 2014) 90 tablet 2  . atenolol (TENORMIN) 100 MG tablet TAKE ONE TABLET BY MOUTH ONCE DAILY 30 tablet 3  . buPROPion (WELLBUTRIN SR) 150 MG 12 hr tablet TAKE 1 TABLET (150 MG TOTAL) BY MOUTH DAILY. THIS IS ONCE DAILY. 30 tablet 5  . escitalopram (LEXAPRO) 10 MG tablet TAKE 1 TABLET (10 MG TOTAL) BY MOUTH DAILY. 30  tablet 5  . loratadine (CLARITIN) 10 MG tablet Take 10 mg by mouth daily as needed.      No current facility-administered medications on file prior to visit.   No Known Allergies History   Social History  . Marital Status: Legally Separated    Spouse Name: N/A  . Number of Children: N/A  . Years of Education: N/A   Occupational History  . Not on file.   Social History Main Topics  . Smoking status: Never Smoker   . Smokeless tobacco: Never Used  . Alcohol Use: 4.2 oz/week    7 Glasses of wine per week  . Drug Use: No  . Sexual Activity: Yes    Birth Control/ Protection: Surgical   Other Topics Concern  . Not on file   Social History Narrative     Review of Systems  All other systems reviewed and are negative.      Objective:   Physical Exam  Constitutional: She is oriented to person, place, and time. She appears well-developed and well-nourished.  Cardiovascular: Normal rate, regular rhythm and normal heart sounds.   No murmur heard. Pulmonary/Chest: Effort normal and breath sounds normal. No respiratory distress. She has no wheezes. She has no rales.  Abdominal: Soft. Bowel sounds are normal. She exhibits no distension.  There is no tenderness. There is no rebound and no guarding.  Neurological: She is alert and oriented to person, place, and time. She has normal reflexes. She displays normal reflexes. No cranial nerve deficit. She exhibits normal muscle tone. Coordination normal.  Vitals reviewed.         Assessment & Plan:  Hyperglycemia - Plan: Hemoglobin A1C, fingerstick, metFORMIN (GLUCOPHAGE) 1000 MG tablet, glipiZIDE (GLUCOTROL XL) 10 MG 24 hr tablet  Diabetes mellitus type II, uncontrolled  Patient has new onset  Diabetes mellitus type 2. Begin metformin 1000 mg by mouth twice a day and glipizide extended release 10 mg by mouth daily. I spent 25 minutes discussing low carbohydrate diet. We also gave the patient a glucometer and testing strips. I will  her checking her fasting blood sugar and two-hour postprandial sugars every day and then recheck in 2 weeks. We will discuss further diabetes management at that time. I'm also concerned she may have obstructive sleep apnea. Once we manage her diabetes successfully, I will recommend a sleep study.

## 2015-01-21 ENCOUNTER — Encounter: Payer: Self-pay | Admitting: Family Medicine

## 2015-01-25 ENCOUNTER — Telehealth: Payer: Self-pay | Admitting: Family Medicine

## 2015-01-25 NOTE — Telephone Encounter (Signed)
LMTRC

## 2015-01-25 NOTE — Telephone Encounter (Signed)
Patient is calling regarding her strips and her insurance that she discussed when she was here last would like to give you some info  [phone]

## 2015-01-26 MED ORDER — GLUCOSE BLOOD VI STRP: ORAL_STRIP | Status: DC

## 2015-01-26 NOTE — Telephone Encounter (Signed)
Pt called insurance and they did not know what meter they would cover and the pt states that she called them twice and could not get an answer either time. i called ins and her insurance covers 75% of any meter and she would pay 25%. The type is not important. Pt ware of this and strips for freestyle lite meter and lancets sent to pharm.

## 2015-02-01 ENCOUNTER — Ambulatory Visit (INDEPENDENT_AMBULATORY_CARE_PROVIDER_SITE_OTHER): Payer: BLUE CROSS/BLUE SHIELD | Admitting: Family Medicine

## 2015-02-01 ENCOUNTER — Encounter: Payer: Self-pay | Admitting: Family Medicine

## 2015-02-01 VITALS — BP 128/84 | HR 74 | Temp 98.2°F | Resp 16 | Ht 61.0 in | Wt 262.0 lb

## 2015-02-01 DIAGNOSIS — E1165 Type 2 diabetes mellitus with hyperglycemia: Secondary | ICD-10-CM

## 2015-02-01 DIAGNOSIS — IMO0002 Reserved for concepts with insufficient information to code with codable children: Secondary | ICD-10-CM

## 2015-02-01 MED ORDER — EMPAGLIFLOZIN 25 MG PO TABS: 25.0000 mg | ORAL_TABLET | Freq: Every day | ORAL | Status: DC

## 2015-02-01 NOTE — Progress Notes (Signed)
Subjective:    Patient ID: Kimberly [name], female    DOB: 15-Sep-1970, 44 y.o.   MRN: [id number]  HPI  01/18/15 Patient reports one month of polyuria, polydipsia, blurred vision. She also reports chronic severe fatigue and hypersomnia. Recently she had her friend check her blood sugar and was found to be greater than 400. Here today in clinic a fingerstick hemoglobin A1c was elevated at 9.8. She also reports loud snoring. She reports a chronic daily morning headache. She reports hypersomnia. She can fall asleep while driving or at stop lights.  At that time, my plan was:  Patient has new onset  Diabetes mellitus type 2. Begin metformin 1000 mg by mouth twice a day and glipizide extended release 10 mg by mouth daily. I spent 25 minutes discussing low carbohydrate diet. We also gave the patient a glucometer and testing strips. I will her checking her fasting blood sugar and two-hour postprandial sugars every day and then recheck in 2 weeks. We will discuss further diabetes management at that time. I'm also concerned she may have obstructive sleep apnea. Once we manage her diabetes successfully, I will recommend a sleep study.  02/01/15 She is here today for follow-up. Her blood sugars are ranging between 200-300.  She continues to experience blurred vision. She denies any hypoglycemic episodes. Past Medical History  Diagnosis Date  . Obesity   . Pseudotumor cerebri   . Anxiety   . Depression   . Hypertension   . SVD (spontaneous vaginal delivery)     x 3  . History of kidney stones     passed stone - no surgery  . Migraine     otc med prn  . Tension headache   . Abnormal vaginal Pap smear     tx with cryotherapy   Past Surgical History  Procedure Laterality Date  . Cryotherapy    . Mandible fracture surgery  2002    x 1  . Wisdom tooth extraction    . Tubal ligation  11/2003  . Laparoscopic assisted vaginal hysterectomy Bilateral 04/21/2014    Procedure: LAPAROSCOPIC ASSISTED  VAGINAL HYSTERECTOMY, BILATERAL SALPINGECTOMY;  Surgeon: Lavina Hamman, MD;  Location: [facility];  Service: Gynecology;  Laterality: Bilateral;   Current Outpatient Prescriptions on File Prior to Visit  Medication Sig Dispense Refill  . acetaZOLAMIDE (DIAMOX) 250 MG tablet TAKE 1 TABLET (250 MG TOTAL) BY MOUTH 2 (TWO) TIMES DAILY. 60 tablet 3  . ALPRAZolam (XANAX) 1 MG tablet TAKE 1 TABLET BY MOUTH 3 TIMES A DAY AS NEEDED FOR ANXIETY (FILL DATE September 07, 2014) 90 tablet 2  . atenolol (TENORMIN) 100 MG tablet TAKE ONE TABLET BY MOUTH ONCE DAILY 30 tablet 3  . buPROPion (WELLBUTRIN SR) 150 MG 12 hr tablet TAKE 1 TABLET (150 MG TOTAL) BY MOUTH DAILY. THIS IS ONCE DAILY. 30 tablet 5  . escitalopram (LEXAPRO) 10 MG tablet TAKE 1 TABLET (10 MG TOTAL) BY MOUTH DAILY. 30 tablet 5  . glipiZIDE (GLUCOTROL XL) 10 MG 24 hr tablet Take 1 tablet (10 mg total) by mouth daily with breakfast. 30 tablet 1  . glucose blood test strip Pt has Freestyle Lite meter - checks BS bid - also need lancets #100/5 refills 50 each 11  . loratadine (CLARITIN) 10 MG tablet Take 10 mg by mouth daily as needed.     . metFORMIN (GLUCOPHAGE) 1000 MG tablet Take 1 tablet (1,000 mg total) by mouth 2 (two) times daily with a meal. 60 tablet 3  No current facility-administered medications on file prior to visit.   No Known Allergies History   Social History  . Marital Status: Legally Separated    Spouse Name: N/A  . Number of Children: N/A  . Years of Education: N/A   Occupational History  . Not on file.   Social History Main Topics  . Smoking status: Never Smoker   . Smokeless tobacco: Never Used  . Alcohol Use: 4.2 oz/week    7 Glasses of wine per week  . Drug Use: No  . Sexual Activity: Yes    Birth Control/ Protection: Surgical   Other Topics Concern  . Not on file   Social History Narrative     Review of Systems  All other systems reviewed and are negative.      Objective:   Physical Exam    Constitutional: She is oriented to person, place, and time. She appears well-developed and well-nourished.  Cardiovascular: Normal rate, regular rhythm and normal heart sounds.   No murmur heard. Pulmonary/Chest: Effort normal and breath sounds normal. No respiratory distress. She has no wheezes. She has no rales.  Abdominal: Soft. Bowel sounds are normal. She exhibits no distension. There is no tenderness. There is no rebound and no guarding.  Neurological: She is alert and oriented to person, place, and time. She has normal reflexes. No cranial nerve deficit. She exhibits normal muscle tone. Coordination normal.  Vitals reviewed.         Assessment & Plan:  Diabetes mellitus type II, uncontrolled - Plan: empagliflozin (JARDIANCE) 25 MG TABS tablet  continue metformin and glipizide. I will add Jardiance 25 mg poqday and recheck in one month. I will also schedule her to see an ophthalmologist

## 2015-02-07 ENCOUNTER — Telehealth: Payer: Self-pay | Admitting: Family Medicine

## 2015-02-07 ENCOUNTER — Other Ambulatory Visit: Payer: Self-pay | Admitting: Family Medicine

## 2015-02-07 MED ORDER — FLUCONAZOLE 150 MG PO TABS: 150.0000 mg | ORAL_TABLET | Freq: Once | ORAL | Status: DC

## 2015-02-07 NOTE — Telephone Encounter (Signed)
Patient calling to say that the jardiance that was prescribed for her has given her a yeast infection would like to know if a tablet can be called in for this  [facility] [place]  [phone]

## 2015-02-07 NOTE — Telephone Encounter (Signed)
Diflucan 150mg po x 1

## 2015-02-07 NOTE — Telephone Encounter (Signed)
Medication called/sent to requested pharmacy  

## 2015-02-08 NOTE — Telephone Encounter (Signed)
ok 

## 2015-02-08 NOTE — Telephone Encounter (Signed)
Medication called to pharmacy. 

## 2015-02-08 NOTE — Telephone Encounter (Signed)
?   OK to Refill  

## 2015-03-01 ENCOUNTER — Telehealth: Payer: Self-pay | Admitting: Family Medicine

## 2015-03-01 NOTE — Telephone Encounter (Signed)
Patient called after-hours line states that she is on Jardiance and now has Yeast  infection. Was advised to call. I gave her Diflucan 150 mg take one repeat in 3 days if still present.

## 2015-03-18 LAB — HM DIABETES EYE EXAM

## 2015-03-23 ENCOUNTER — Telehealth: Payer: Self-pay | Admitting: Family Medicine

## 2015-03-23 NOTE — Telephone Encounter (Signed)
[facility] [place]  Patient is taking juardiance  And has got a yeast infection would like to know if some diflucan can be called in [phone] (H)

## 2015-03-24 ENCOUNTER — Other Ambulatory Visit: Payer: Self-pay | Admitting: Family Medicine

## 2015-03-24 MED ORDER — FLUCONAZOLE 150 MG PO TABS: 150.0000 mg | ORAL_TABLET | Freq: Once | ORAL | Status: DC

## 2015-03-24 NOTE — Telephone Encounter (Signed)
Left pt message Rx sent

## 2015-03-24 NOTE — Telephone Encounter (Signed)
Yes. Diflucan 150 mg 1 by mouth 1 dispense #1+0.

## 2015-04-15 ENCOUNTER — Encounter: Payer: Self-pay | Admitting: Family Medicine

## 2015-05-05 ENCOUNTER — Telehealth: Payer: Self-pay | Admitting: Family Medicine

## 2015-05-05 MED ORDER — FLUCONAZOLE 150 MG PO TABS: 150.0000 mg | ORAL_TABLET | Freq: Once | ORAL | Status: DC

## 2015-05-05 NOTE — Telephone Encounter (Signed)
Diflucan 150 po x 1

## 2015-05-05 NOTE — Telephone Encounter (Signed)
Medication called/sent to requested pharmacy  

## 2015-05-05 NOTE — Telephone Encounter (Signed)
Patient has yeast infection from diabetic medication, would like to know if she can get rx called in for this if possible  [phone] [facility] [place]

## 2015-05-08 ENCOUNTER — Other Ambulatory Visit: Payer: Self-pay | Admitting: Family Medicine

## 2015-05-13 ENCOUNTER — Other Ambulatory Visit: Payer: Self-pay | Admitting: Family Medicine

## 2015-05-13 NOTE — Telephone Encounter (Signed)
Medication called to pharmacy. 

## 2015-05-13 NOTE — Telephone Encounter (Signed)
okay

## 2015-05-13 NOTE — Telephone Encounter (Signed)
Ok to refill??  Last office visit 02/01/2015.  Last refill 02/08/2015, #2 refills.

## 2015-08-12 ENCOUNTER — Telehealth: Payer: Self-pay | Admitting: Family Medicine

## 2015-08-12 MED ORDER — PIOGLITAZONE HCL 30 MG PO TABS: 30.0000 mg | ORAL_TABLET | Freq: Every day | ORAL | Status: DC

## 2015-08-12 MED ORDER — ALPRAZOLAM 1 MG PO TABS: ORAL_TABLET | ORAL | Status: DC

## 2015-08-12 NOTE — Telephone Encounter (Signed)
Patient is calling to ask questions about her jardiance  [phone]

## 2015-08-12 NOTE — Telephone Encounter (Signed)
The pt is on medicaid and needs to change to a medication that her ins will cover. Per wtp change to Actos . Pt aware and med sent to pharm. Also requesting a refill on her Xanax - per wtp ok to refill.

## 2015-08-12 NOTE — Telephone Encounter (Signed)
lmtrc

## 2015-08-18 ENCOUNTER — Telehealth: Payer: Self-pay | Admitting: Family Medicine

## 2015-08-18 NOTE — Telephone Encounter (Signed)
Pt requests a refill of Diflucan for a yeast infection. Dr. Tanya Nones told her that Jardiance could cause a yeast infection from time to time.  [facility] [phone]

## 2015-08-18 NOTE — Telephone Encounter (Signed)
OK to fill

## 2015-08-19 MED ORDER — FLUCONAZOLE 150 MG PO TABS: 150.0000 mg | ORAL_TABLET | Freq: Once | ORAL | Status: DC

## 2015-08-19 NOTE — Telephone Encounter (Signed)
Diflucan 150mg po x 1

## 2015-08-19 NOTE — Telephone Encounter (Signed)
Medication called/sent to requested pharmacy  

## 2015-09-07 ENCOUNTER — Encounter: Payer: Self-pay | Admitting: Family Medicine

## 2015-09-13 ENCOUNTER — Other Ambulatory Visit: Payer: Self-pay | Admitting: Family Medicine

## 2015-09-13 ENCOUNTER — Telehealth: Payer: Self-pay | Admitting: Family Medicine

## 2015-09-13 NOTE — Telephone Encounter (Signed)
Medication called to pharmacy on 08/12/2015 with #2 refills.   Call placed to pharmacy. Refills are still on file.   Request denied.

## 2015-09-13 NOTE — Telephone Encounter (Signed)
Pt has made an appt for a med refill for 09/19/15. She was told to let Dr. Tanya NonesPickard know when she made the appt so that he can [facility] [place].

## 2015-09-14 NOTE — Telephone Encounter (Signed)
Med was called in on 08/12/15 with 2 additional refills - she should have refills on her Xanax

## 2015-09-14 NOTE — Telephone Encounter (Signed)
Pt is having problems with her insurance - as of now she does not have any - hopefully she will shortly - she has been taking her metformin but nothing else for her dm and she was wanting to know if she really needed to come in to be seen and have labs done or could it wait a few months until she got ins???

## 2015-09-15 NOTE — Telephone Encounter (Signed)
Ok to wait 3 months.

## 2015-09-15 NOTE — Telephone Encounter (Signed)
LMTRC

## 2015-09-19 ENCOUNTER — Ambulatory Visit: Payer: BLUE CROSS/BLUE SHIELD | Admitting: Family Medicine

## 2015-09-20 ENCOUNTER — Encounter: Payer: Self-pay | Admitting: Family Medicine

## 2015-09-21 ENCOUNTER — Other Ambulatory Visit: Payer: Self-pay | Admitting: Family Medicine

## 2015-09-21 NOTE — Telephone Encounter (Signed)
Pt no showed for last scheduled appt

## 2015-09-28 ENCOUNTER — Other Ambulatory Visit: Payer: Self-pay | Admitting: Family Medicine

## 2015-10-10 ENCOUNTER — Other Ambulatory Visit: Payer: Self-pay | Admitting: Family Medicine

## 2015-10-10 NOTE — Telephone Encounter (Signed)
Refill appropriate and filled per protocol. 

## 2015-10-26 ENCOUNTER — Encounter: Payer: Self-pay | Admitting: Emergency Medicine

## 2015-10-26 ENCOUNTER — Emergency Department
Admission: EM | Admit: 2015-10-26 | Discharge: 2015-10-26 | Disposition: A | Discharge status: Home / Self Care | Payer: Medicaid Other | Attending: Emergency Medicine | Admitting: Emergency Medicine

## 2015-10-26 ENCOUNTER — Emergency Department: Payer: Medicaid Other

## 2015-10-26 DIAGNOSIS — Z79899 Other long term (current) drug therapy: Secondary | ICD-10-CM | POA: Insufficient documentation

## 2015-10-26 DIAGNOSIS — E119 Type 2 diabetes mellitus without complications: Secondary | ICD-10-CM | POA: Diagnosis not present

## 2015-10-26 DIAGNOSIS — R0789 Other chest pain: Secondary | ICD-10-CM | POA: Diagnosis present

## 2015-10-26 DIAGNOSIS — R079 Chest pain, unspecified: Secondary | ICD-10-CM

## 2015-10-26 DIAGNOSIS — Z7984 Long term (current) use of oral hypoglycemic drugs: Secondary | ICD-10-CM | POA: Diagnosis not present

## 2015-10-26 DIAGNOSIS — F329 Major depressive disorder, single episode, unspecified: Secondary | ICD-10-CM | POA: Diagnosis not present

## 2015-10-26 DIAGNOSIS — I10 Essential (primary) hypertension: Secondary | ICD-10-CM | POA: Insufficient documentation

## 2015-10-26 DIAGNOSIS — F419 Anxiety disorder, unspecified: Secondary | ICD-10-CM | POA: Diagnosis not present

## 2015-10-26 DIAGNOSIS — E669 Obesity, unspecified: Secondary | ICD-10-CM | POA: Diagnosis not present

## 2015-10-26 DIAGNOSIS — M79602 Pain in left arm: Secondary | ICD-10-CM

## 2015-10-26 HISTORY — DX: Type 2 diabetes mellitus without complications: E11.9

## 2015-10-26 LAB — CBC
HCT: 43.6 % (ref 35.0–47.0)
Hemoglobin: 14.7 g/dL (ref 12.0–16.0)
MCH: 29.6 pg (ref 26.0–34.0)
MCHC: 33.7 g/dL (ref 32.0–36.0)
MCV: 87.9 fL (ref 80.0–100.0)
Platelets: 134 10*3/uL — ABNORMAL LOW (ref 150–440)
RBC: 4.96 MIL/uL (ref 3.80–5.20)
RDW: 13 % (ref 11.5–14.5)
WBC: 6.5 10*3/uL (ref 3.6–11.0)

## 2015-10-26 LAB — BASIC METABOLIC PANEL
Anion gap: 11 (ref 5–15)
BUN: 15 mg/dL (ref 6–20)
CO2: 20 mmol/L — ABNORMAL LOW (ref 22–32)
Calcium: 9 mg/dL (ref 8.9–10.3)
Chloride: 105 mmol/L (ref 101–111)
Creatinine, Ser: 0.93 mg/dL (ref 0.44–1.00)
GFR calc Af Amer: >60 mL/min (ref >60)
GFR calc non Af Amer: >60 mL/min (ref >60)
Glucose, Bld: 278 mg/dL — ABNORMAL HIGH (ref 65–99)
Potassium: 3.7 mmol/L (ref 3.5–5.1)
Sodium: 136 mmol/L (ref 135–145)

## 2015-10-26 LAB — TROPONIN I
Troponin I: <0.03 ng/mL (ref <0.031)
Troponin I: <0.03 ng/mL (ref <0.031)

## 2015-10-26 MED ORDER — IBUPROFEN 600 MG PO TABS
ORAL_TABLET | ORAL | Status: AC
  Administered 2015-10-26: 600 mg
  Filled 2015-10-26: qty 1

## 2015-10-26 MED ORDER — PREDNISONE 20 MG PO TABS: 40.0000 mg | ORAL_TABLET | Freq: Every day | ORAL | Status: DC

## 2015-10-26 MED ORDER — GI COCKTAIL ~~LOC~~
30.0000 mL | Freq: Once | ORAL | Status: AC
  Administered 2015-10-26: 30 mL via ORAL
  Filled 2015-10-26: qty 30

## 2015-10-26 MED ORDER — LORAZEPAM 2 MG/ML IJ SOLN
INTRAMUSCULAR | Status: AC
  Administered 2015-10-26: 1 mg
  Filled 2015-10-26: qty 1

## 2015-10-26 MED ORDER — IBUPROFEN 600 MG PO TABS
600.0000 mg | ORAL_TABLET | Freq: Three times a day (TID) | ORAL | Status: DC | PRN
Start: 2015-10-26 — End: 2015-10-28

## 2015-10-26 NOTE — ED Notes (Signed)
Pt states she began having chest pressure last night with sharp shooting pain down left arm with left hand tingling.  Pt states pain resolved with rest, so she went to bed early.  Pain woke her several times throughout the night, but then it resolved.  Pain was gone this morning, but returned while patient was loading the dishwasher.  Pt endorses shortness of breath, diaphoresis and some nausea with chest pressure.

## 2015-10-26 NOTE — ED Notes (Signed)
Pt reports that her pain is a 12 out of 10 - pain is in her left arm and radiating into her left jaw and left arm - she states that she is unable to handle this type of pain and needs medication - MD notified

## 2015-10-26 NOTE — ED Notes (Addendum)
Verbal order for 1mg  of Ativan the other 1 mg of Ativan was wasted with charge nurse

## 2015-10-26 NOTE — Discharge Instructions (Signed)
Please seek medical attention for any high fevers, chest pain, shortness of breath, change in behavior, persistent vomiting, bloody stool or any other new or concerning symptoms.    Nonspecific Chest Pain It is often hard to find the cause of chest pain. There is always a chance that your pain could be related to something serious, such as a heart attack or a blood clot in your lungs. Chest pain can also be caused by conditions that are not life-threatening. If you have chest pain, it is very important to follow up with your doctor.  HOME CARE  If you were prescribed an antibiotic medicine, finish it all even if you start to feel better.  Avoid any activities that cause chest pain.  Do not use any tobacco products, including cigarettes, chewing tobacco, or electronic cigarettes. If you need help quitting, ask your doctor.  Do not drink alcohol.  Take medicines only as told by your doctor.  Keep all follow-up visits as told by your doctor. This is important. This includes any further testing if your chest pain does not go away.  Your doctor may tell you to keep your head raised (elevated) while you sleep.  Make lifestyle changes as told by your doctor. These may include:  Getting regular exercise. Ask your doctor to suggest some activities that are safe for you.  Eating a heart-healthy diet. Your doctor or a diet specialist (dietitian) can help you to learn healthy eating options.  Maintaining a healthy weight.  Managing diabetes, if necessary.  Reducing stress. GET HELP IF:  Your chest pain does not go away, even after treatment.  You have a rash with blisters on your chest.  You have a fever. GET HELP RIGHT AWAY IF:  Your chest pain is worse.  You have an increasing cough, or you cough up blood.  You have severe belly (abdominal) pain.  You feel extremely weak.  You pass out (faint).  You have chills.  You have sudden, unexplained chest discomfort.  You have  sudden, unexplained discomfort in your arms, back, neck, or jaw.  You have shortness of breath at any time.  You suddenly start to sweat, or your skin gets clammy.  You feel nauseous.  You vomit.  You suddenly feel light-headed or dizzy.  Your heart begins to beat quickly, or it feels like it is skipping beats. These symptoms may be an emergency. Do not wait to see if the symptoms will go away. Get medical help right away. Call your local emergency services (911 in the [place] Do not drive yourself to the hospital.   This information is not intended to replace advice given to you by your health care provider. Make sure you discuss any questions you have with your health care provider.   Document Released: 11/28/2007 Document Revised: 07/02/2014 Document Reviewed: 01/15/2014 Elsevier Interactive Patient Education 2016 Elsevier Inc.  Outpatient Psychiatry and Counseling  [facility]: Mobile Crisis Management 24 hours:  [phone]  [facility] of the [employer] sliding scale fee and walk in schedule: M-F 8am-12pm/1pm-3pm [address]  [place], [place] [address] [phone]  [facility] [address] [place], [place] [address] [phone]  [facility] ([facility] [employer])- new patient walk-in appointments available Monday - Friday 8am -3pm.          [address] [place], [place] [address] [phone] or crisis line- [phone]  [facility]/ Intensive Outpatient Therapy Program [address] [place], [place]  [id number] [phone]  [facility]                  Crisis Services      [phone]      [address]     [place], [place] [address]                 [facility]   [facility] [phone] [address] [place], [place] [address]   [facility]          [address] [place],   [place], [place] [address]       [phone]  [facility] [address] [place], [place] [address] [phone]  [facility]    [address]    [place], [place] [address]     [phone]       Andee PolesParish McKinney, MD     [address]     [place] [address]     [phone]       [facility] [address] [place] [address]  [facility]     [address]     [place], [place]      [phone]       [facility] Eulogio DitchShamsher Ahluwalia, MD [address] [place], [place] [address] [phone]  [facility]     [address]     [place], [place] [address]     [phone]       [facility] [address] [place], [place] [address] [phone] Resources for Temporary Residential Assistance/Crisis Centers  [facility](IRC) M-F 8am-3pm   [address] [place], [place] [address]   [phone] Services include: laundry, barbering, support groups, case management, phone  & computer access, showers, AA/NA mtgs, mental health/substance abuse nurse, job skills class, disability information, VA assistance, spiritual classes, etc.   HOMELESS SHELTERS  [facility]     [facility]   [address], [place]     [phone]              [facility] (women and children)       [address] [place], [place] [address] [phone] [email or web address] for application and process Application Required  [facility]   [address]    [place] [address]     [phone]                    [facility] [address] [place], [place] [address] [phone](schedule application appt.) Application Required  [facility] (women only)    [address]     [place], [place]  [address]     [phone]      Intake starts 6pm daily Need valid ID, SSC, & Police report [facility] [place] [address] [place], [place] [phone] Application Required  [facility] (men only)     [address]      [place], [place]     [phone]       [facility] (Pregnant women only) [address] [place], [place] [phone]  [facility]      [address]      [place], [place] [address]  [place] [address] [place], [place] 90 day commitment/SA/Application process  [facility](men only)     [address]     [place], [place]       Check-in at [facility] [address] [place], [place] [address] [phone] Men/Women/Women and Children must be there by 7 pm  [place], [place]

## 2015-10-26 NOTE — ED Provider Notes (Signed)
[facility] Emergency Department Provider Note   ____________________________________________  Time seen: ~1355  I have reviewed the triage vital signs and the nursing notes.   HISTORY  Chief Complaint Chest Pain   History limited by: Not Limited   HPI Kimberly [name] is a 45 y.o. female who presents to the emergency department because of chest pain. She describes it as being on the far left side of her chest. She describes it as being sharp. It started yesterday. Yesterday was intermittent. She denies any pattern to it. It was not worse with deep breaths, exertion. It was not positional. She stated that it would occur when she was lying on the couch. She describes pain as well in her arm and neck. She is unsure if her shortness of breath was due to the level of pain or if it was being caused by the same process. She does state that she felt flushed. She has not had any measured fevers. She does state she is under a lot of stress recently.   Past Medical History  Diagnosis Date  . Obesity   . Pseudotumor cerebri   . Anxiety   . Depression   . Hypertension   . SVD (spontaneous vaginal delivery)     x 3  . History of kidney stones     passed stone - no surgery  . Migraine     otc med prn  . Tension headache   . Abnormal vaginal Pap smear     tx with cryotherapy  . Diabetes mellitus without complication [facility])     Patient Active Problem List   Diagnosis Date Noted  . Abnormal uterine bleeding unrelated to menstrual cycle 04/21/2014  . Anxiety   . Depression   . Pseudotumor cerebri   . Hypertension   . Obesity     Past Surgical History  Procedure Laterality Date  . Cryotherapy    . Mandible fracture surgery  2002    x 1  . Wisdom tooth extraction    . Tubal ligation  11/2003  . Laparoscopic assisted vaginal hysterectomy Bilateral 04/21/2014    Procedure: LAPAROSCOPIC ASSISTED VAGINAL HYSTERECTOMY, BILATERAL SALPINGECTOMY;  Surgeon: Lavina Hamman, MD;  Location: [facility];  Service: Gynecology;  Laterality: Bilateral;    Current Outpatient Rx  Name  Route  Sig  Dispense  Refill  . acetaZOLAMIDE (DIAMOX) 250 MG tablet      TAKE 1 TABLET (250 MG TOTAL) BY MOUTH 2 (TWO) TIMES DAILY.   60 tablet   3   . ALPRAZolam (XANAX) 1 MG tablet      TAKE 1 TABLET BY MOUTH 3 TIMES DAILY AS NEEDED FOR ANXIETY   90 tablet   2   . atenolol (TENORMIN) 100 MG tablet      TAKE ONE TABLET BY MOUTH ONCE DAILY   30 tablet   3   . atenolol (TENORMIN) 100 MG tablet      TAKE ONE TABLET BY MOUTH ONCE DAILY   30 tablet   11   . buPROPion (WELLBUTRIN SR) 150 MG 12 hr tablet      TAKE 1 TABLET (150 MG TOTAL) BY MOUTH DAILY. THIS IS ONCE DAILY.   30 tablet   5   . escitalopram (LEXAPRO) 10 MG tablet      TAKE 1 TABLET (10 MG TOTAL) BY MOUTH DAILY.   30 tablet   6   . fluconazole (DIFLUCAN) 150 MG tablet   Oral   Take  1 tablet (150 mg total) by mouth once.   1 tablet   0   . glucose blood test strip      Pt has Freestyle Lite meter - checks BS bid - also need lancets #100/5 refills   50 each   11   . Lancets (FREESTYLE) lancets      USE AS DIRECTED TO CHECK BLOOD SUGAR TWICE A DAY      5   . loratadine (CLARITIN) 10 MG tablet   Oral   Take 10 mg by mouth daily as needed.          . metFORMIN (GLUCOPHAGE) 1000 MG tablet      TAKE 1 TABLET (1,000 MG TOTAL) BY MOUTH 2 (TWO) TIMES DAILY WITH A MEAL.   60 tablet   3   . pioglitazone (ACTOS) 30 MG tablet   Oral   Take 1 tablet (30 mg total) by mouth daily.   30 tablet   3     Allergies Review of patient's allergies indicates no known allergies.  Family History  Problem Relation Age of Onset  . Hypertension Mother   . Diabetes Sister   . Hypertension Sister     Social History Social History  Substance Use Topics  . Smoking status: Never Smoker   . Smokeless tobacco: Never Used  . Alcohol Use: 0.6 oz/week    1 Glasses of wine per week     Comment:  one glass of wine per week    Review of Systems  Constitutional: Negative for fever. Cardiovascular: Positive for left-sided chest pain Respiratory: Positive for shortness of breath. Gastrointestinal: Negative for abdominal pain, vomiting and diarrhea. Neurological: Negative for headaches, focal weakness or numbness.  10-point ROS otherwise negative.  ____________________________________________   PHYSICAL EXAM:  VITAL SIGNS: ED Triage Vitals  Enc Vitals Group     BP --      Pulse Rate 10/26/15 1335 67     Resp 10/26/15 1335 11     Temp 10/26/15 1335 98.4 F (36.9 C)     Temp Source 10/26/15 1335 Oral     SpO2 10/26/15 1335 96 %     Weight 10/26/15 1335 218 lb (98.884 kg)     Height 10/26/15 1335 5\' 1"  (1.549 m)     Head Cir --      Peak Flow --      Pain Score 10/26/15 1337 10   Constitutional: Alert and oriented. Appears anxious Eyes: Conjunctivae are normal. PERRL. Normal extraocular movements. ENT   Head: Normocephalic and atraumatic.   Nose: No congestion/rhinnorhea.   Mouth/Throat: Mucous membranes are moist.   Neck: No stridor. Hematological/Lymphatic/Immunilogical: No cervical lymphadenopathy. Cardiovascular: Normal rate, regular rhythm.  No murmurs, rubs, or gallops. Respiratory: Normal respiratory effort without tachypnea nor retractions. Breath sounds are clear and equal bilaterally. No wheezes/rales/rhonchi. Gastrointestinal: Soft and nontender. No distention. There is no CVA tenderness. Genitourinary: Deferred Musculoskeletal: Normal range of motion in all extremities. No joint effusions.  No lower extremity tenderness nor edema. Neurologic:  Normal speech and language. No gross focal neurologic deficits are appreciated.  Skin:  Skin is warm, dry and intact. No rash noted. Psychiatric: Mood and affect are normal. Speech and behavior are normal. Patient exhibits appropriate insight and  judgment.  ____________________________________________    LABS (pertinent positives/negatives)  Labs Reviewed  BASIC METABOLIC PANEL - Abnormal; Notable for the following:    CO2 20 (*)    Glucose, Bld 278 (*)    All  other components within normal limits  CBC - Abnormal; Notable for the following:    Platelets 134 (*)    All other components within normal limits  TROPONIN I  TROPONIN I    ____________________________________________   EKG  I, Phineas Semen, attending physician, personally viewed and interpreted this EKG  EKG Time: 1337 Rate: 69 Rhythm: normal sinus rhythm Axis: normal Intervals: qtc 455 QRS: narrow ST changes: no st elevation Impression: normal ekg  I, Phineas Semen, attending physician, personally viewed and interpreted this EKG  EKG Time: 1630 Rate: 63 Rhythm: normal sinus rhythm Axis: normal Intervals: qtc 431 QRS: narrow ST changes: no st elevation Impression: normal ekg    ____________________________________________    RADIOLOGY  CXR  IMPRESSION: No active cardiopulmonary disease.  ____________________________________________   PROCEDURES  Procedure(s) performed: None  Critical Care performed: No  ____________________________________________   INITIAL IMPRESSION / ASSESSMENT AND PLAN / ED COURSE  Pertinent labs & imaging results that were available during my care of the patient were reviewed by me and considered in my medical decision making (see chart for details).  Patient presented to the emergency department today because of concerns for left-sided chest pain. In terms of ACS patient has risk factors of hypertension and early diabetes. Patient denies smoking or known family history. I think this would be unlikely to be ACS given sharp in nature. Constant today. EKG without any concerning findings. Will however check chest x-ray and blood work.  ----------------------------------------- 3:46 PM on  10/26/2015 -----------------------------------------  Workup here without any concerning findings. The patient did not feel any relief with the GI cocktail. She is not given timing of worsening arm pain. She describes it as shooting down her arm and feels like her fingers are going to explode. On reevaluation the hand is still warm. Normal color to the hand. Radial and ulnar pulses 2+. No swelling. Wonder if this could be due to cervical radiculopathy. Will try ibuprofen.  ----------------------------------------- 4:21 PM on 10/26/2015 -----------------------------------------  Patient now complaining of crushing chest pain. Again I doubt ACS however will check a repeat EKG and will get a second troponin out of an abundance of caution.  ----------------------------------------- 6:24 PM on 10/26/2015 -----------------------------------------  Second troponin negative. Repeat EKG without any concerning findings. At this point I doubt ACS. I think a lot has to do with the stress that the patient is under. We will however give patient cardiology follow-up if she continues to have concerns.  ____________________________________________   FINAL CLINICAL IMPRESSION(S) / ED DIAGNOSES  Final diagnoses:  Chest pain, unspecified chest pain type  Pain of left upper extremity  Anxiety     Phineas Semen, MD 10/26/15 1825

## 2015-10-26 NOTE — ED Notes (Signed)
Handoff given to Teresa, RN 

## 2015-10-28 ENCOUNTER — Encounter: Payer: Self-pay | Admitting: Family Medicine

## 2015-10-28 ENCOUNTER — Ambulatory Visit (INDEPENDENT_AMBULATORY_CARE_PROVIDER_SITE_OTHER): Payer: Medicaid Other | Admitting: Family Medicine

## 2015-10-28 VITALS — BP 126/90 | HR 76 | Temp 97.7°F | Resp 18 | Ht 61.0 in | Wt 253.0 lb

## 2015-10-28 DIAGNOSIS — E1100 Type 2 diabetes mellitus with hyperosmolarity without nonketotic hyperglycemic-hyperosmolar coma (NKHHC): Secondary | ICD-10-CM | POA: Diagnosis not present

## 2015-10-28 DIAGNOSIS — M5010 Cervical disc disorder with radiculopathy, unspecified cervical region: Secondary | ICD-10-CM

## 2015-10-28 LAB — COMPLETE METABOLIC PANEL WITH GFR
ALT: 88 U/L — ABNORMAL HIGH (ref 6–29)
AST: 27 U/L (ref 10–30)
Albumin: 4.6 g/dL (ref 3.6–5.1)
Alkaline Phosphatase: 119 U/L — ABNORMAL HIGH (ref 33–115)
BUN: 14 mg/dL (ref 7–25)
CO2: 19 mmol/L — ABNORMAL LOW (ref 20–31)
Calcium: 9.7 mg/dL (ref 8.6–10.2)
Chloride: 102 mmol/L (ref 98–110)
Creat: 0.98 mg/dL (ref 0.50–1.10)
GFR, Est African American: 81 mL/min (ref >=60)
GFR, Est Non African American: 70 mL/min (ref >=60)
Glucose, Bld: 321 mg/dL — ABNORMAL HIGH (ref 70–99)
Potassium: 4.3 mmol/L (ref 3.5–5.3)
Sodium: 136 mmol/L (ref 135–146)
Total Bilirubin: 0.7 mg/dL (ref 0.2–1.2)
Total Protein: 7.4 g/dL (ref 6.1–8.1)

## 2015-10-28 LAB — LIPID PANEL
Cholesterol: 340 mg/dL — ABNORMAL HIGH (ref 125–200)
HDL: 28 mg/dL — ABNORMAL LOW (ref >=46)
LDL Cholesterol: 250 mg/dL — ABNORMAL HIGH (ref <130)
Total CHOL/HDL Ratio: 12.1 Ratio — ABNORMAL HIGH (ref <=5.0)
Triglycerides: 310 mg/dL — ABNORMAL HIGH (ref <150)
VLDL: 62 mg/dL — ABNORMAL HIGH (ref <30)

## 2015-10-28 LAB — HEMOGLOBIN A1C
Hgb A1c MFr Bld: 8.7 % — ABNORMAL HIGH (ref <5.7)
Mean Plasma Glucose: 203 mg/dL

## 2015-10-28 MED ORDER — GABAPENTIN 300 MG PO CAPS: 300.0000 mg | ORAL_CAPSULE | Freq: Three times a day (TID) | ORAL | Status: DC

## 2015-10-28 MED ORDER — OXYCODONE-ACETAMINOPHEN 7.5-325 MG PO TABS: 1.0000 | ORAL_TABLET | ORAL | Status: DC | PRN

## 2015-10-28 NOTE — Progress Notes (Signed)
Subjective:    Patient ID: Kimberly [name], female    DOB: [birth year], 45 y.o.   MRN: [id number]  HPI  01/18/15 Patient reports one month of polyuria, polydipsia, blurred vision. She also reports chronic severe fatigue and hypersomnia. Recently she had her friend check her blood sugar and was found to be greater than 400. Here today in clinic a fingerstick hemoglobin A1c was elevated at 9.8. She also reports loud snoring. She reports a chronic daily morning headache. She reports hypersomnia. She can fall asleep while driving or at stop lights.  At that time, my plan was:  Patient has new onset  Diabetes mellitus type 2. Begin metformin 1000 mg by mouth twice a day and glipizide extended release 10 mg by mouth daily. I spent 25 minutes discussing low carbohydrate diet. We also gave the patient a glucometer and testing strips. I will her checking her fasting blood sugar and two-hour postprandial sugars every day and then recheck in 2 weeks. We will discuss further diabetes management at that time. I'm also concerned she may have obstructive sleep apnea. Once we manage her diabetes successfully, I will recommend a sleep study.  02/01/15 She is here today for follow-up. Her blood sugars are ranging between 200-300.  She continues to experience blurred vision. She denies any hypoglycemic episodes.  At that time, my plan was:  continue metformin and glipizide. I will add Jardiance 25 mg poqday and recheck in one month. I will also schedule her to see an ophthalmologist  10/28/15 Patient lost her insurance and stopped all of her medication. She is only recently resumed her medication one week ago. Therefore I do not expect her hemoglobin A1c to be any better. She has not been checking her blood sugars. Recently she went to the hospital with chest pain. EKG and cardiac enzymes were normal. However the majority of her pain is burning stinging pain radiating from her left elbow down into her left hand. She also  complains of pain radiating from the left side of her neck into her left shoulder and down her left arm. The pain is intense. However it is so he goes away if she lays down and holds her arm in an internally rotated position with the Lookingglass resting on the mattress. Is soon as she stands up the pain returns with severity. She has a negative empty can sign. A negative Hawkins sign. A positive Spurling sign. A negative Tinel sign. Negative Phalen sign. She has normal strength in her arm with normal reflexes Past Medical History  Diagnosis Date  . Obesity   . Pseudotumor cerebri   . Anxiety   . Depression   . Hypertension   . SVD (spontaneous vaginal delivery)     x 3  . History of kidney stones     passed stone - no surgery  . Migraine     otc med prn  . Tension headache   . Abnormal vaginal Pap smear     tx with cryotherapy  . Diabetes mellitus without complication [facility])    Past Surgical History  Procedure Laterality Date  . Cryotherapy    . Mandible fracture surgery  2002    x 1  . Wisdom tooth extraction    . Tubal ligation  11/2003  . Laparoscopic assisted vaginal hysterectomy Bilateral 04/21/2014    Procedure: LAPAROSCOPIC ASSISTED VAGINAL HYSTERECTOMY, BILATERAL SALPINGECTOMY;  Surgeon: Lavina Hamman, MD;  Location: [facility];  Service: Gynecology;  Laterality: Bilateral;   Current Outpatient  Prescriptions on File Prior to Visit  Medication Sig Dispense Refill  . acetaZOLAMIDE (DIAMOX) 250 MG tablet Take 250 mg by mouth 2 (two) times daily.    Marland Kitchen atenolol (TENORMIN) 100 MG tablet Take 100 mg by mouth at bedtime.     Marland Kitchen buPROPion (WELLBUTRIN SR) 150 MG 12 hr tablet Take 150 mg by mouth at bedtime.     . empagliflozin (JARDIANCE) 25 MG TABS tablet Take 25 mg by mouth at bedtime. Reported on 10/28/2015    . escitalopram (LEXAPRO) 10 MG tablet Take 10 mg by mouth at bedtime.     . metFORMIN (GLUCOPHAGE) 1000 MG tablet Take 1,000 mg by mouth 2 (two) times daily with a meal.    . predniSONE  (DELTASONE) 20 MG tablet Take 2 tablets (40 mg total) by mouth daily. 10 tablet 0  . ALPRAZolam (XANAX) 1 MG tablet Take 1 mg by mouth 3 (three) times daily as needed for anxiety. Reported on 10/28/2015     No current facility-administered medications on file prior to visit.   No Known Allergies Social History   Social History  . Marital Status: Divorced    Spouse Name: N/A  . Number of Children: N/A  . Years of Education: N/A   Occupational History  . Not on file.   Social History Main Topics  . Smoking status: Never Smoker   . Smokeless tobacco: Never Used  . Alcohol Use: 0.6 oz/week    1 Glasses of wine per week     Comment: one glass of wine per week  . Drug Use: No  . Sexual Activity: Yes    Birth Control/ Protection: Surgical   Other Topics Concern  . Not on file   Social History Narrative     Review of Systems  All other systems reviewed and are negative.      Objective:   Physical Exam  Constitutional: She is oriented to person, place, and time. She appears well-developed and well-nourished.  Cardiovascular: Normal rate, regular rhythm and normal heart sounds.   No murmur heard. Pulmonary/Chest: Effort normal and breath sounds normal. No respiratory distress. She has no wheezes. She has no rales.  Abdominal: Soft. Bowel sounds are normal. She exhibits no distension. There is no tenderness. There is no rebound and no guarding.  Neurological: She is alert and oriented to person, place, and time. She has normal reflexes. No cranial nerve deficit. She exhibits normal muscle tone. Coordination normal.  Vitals reviewed.         Assessment & Plan:  Cervical disc disorder with radiculopathy of cervical region - Plan: oxyCODONE-acetaminophen (PERCOCET) 7.5-325 MG tablet, gabapentin (NEURONTIN) 300 MG capsule  Uncontrolled type 2 diabetes mellitus with hyperosmolarity without coma, without long-term current use of insulin (HCC) - Plan: CBC with  Differential/Platelet, COMPLETE METABOLIC PANEL WITH GFR, Lipid panel, Hemoglobin A1c Obtain fasting lab work for a baseline including a hemoglobin A1c, CMP, and a fasting lipid panel. I anticipate these are going to be very poor because the patient just started her medication one week ago. I would recheck fasting lab work in 3 months on the medication. I believe the pain in her arm is likely cervical radiculopathy. The emergency room gave her a prednisone taper pack which has not helped. I will start her on gabapentin 300 mg by mouth 3 times a day for radicular pain and gave her Percocet for breakthrough pain. I will schedule an MRI of her neck

## 2015-10-29 LAB — CBC WITH DIFFERENTIAL/PLATELET
Basophils Absolute: 0 cells/uL (ref 0–200)
Basophils Relative: 0 %
Eosinophils Absolute: 0 cells/uL — ABNORMAL LOW (ref 15–500)
Eosinophils Relative: 0 %
HCT: 46.7 % — ABNORMAL HIGH (ref 35.0–45.0)
Hemoglobin: 15.6 g/dL — ABNORMAL HIGH (ref 12.0–15.0)
Lymphocytes Relative: 14 %
Lymphs Abs: 1904 cells/uL (ref 850–3900)
MCH: 29.8 pg (ref 27.0–33.0)
MCHC: 33.4 g/dL (ref 32.0–36.0)
MCV: 89.3 fL (ref 80.0–100.0)
MPV: 11.1 fL (ref 7.5–12.5)
Monocytes Absolute: 816 cells/uL (ref 200–950)
Monocytes Relative: 6 %
Neutro Abs: 10880 cells/uL — ABNORMAL HIGH (ref 1500–7800)
Neutrophils Relative %: 80 %
Platelets: 204 10*3/uL (ref 140–400)
RBC: 5.23 MIL/uL — ABNORMAL HIGH (ref 3.80–5.10)
RDW: 13.3 % (ref 11.0–15.0)
WBC: 13.6 10*3/uL — ABNORMAL HIGH (ref 3.8–10.8)

## 2015-11-05 ENCOUNTER — Inpatient Hospital Stay: Admission: RE | Admit: 2015-11-05 | Payer: Medicaid Other | Source: Ambulatory Visit

## 2015-11-07 ENCOUNTER — Other Ambulatory Visit: Payer: Self-pay | Admitting: *Deleted

## 2015-11-07 ENCOUNTER — Telehealth: Payer: Self-pay | Admitting: *Deleted

## 2015-11-07 DIAGNOSIS — M5010 Cervical disc disorder with radiculopathy, unspecified cervical region: Secondary | ICD-10-CM

## 2015-11-07 MED ORDER — OXYCODONE-ACETAMINOPHEN 5-325 MG PO TABS: 1.0000 | ORAL_TABLET | ORAL | Status: DC | PRN

## 2015-11-07 MED ORDER — ATORVASTATIN CALCIUM 40 MG PO TABS: 40.0000 mg | ORAL_TABLET | Freq: Every day | ORAL | Status: DC

## 2015-11-07 NOTE — Telephone Encounter (Signed)
Received call from patient.   Reports that she was given Oxycodone for pain until she could get MRI done over the weekend.   States that Medicaid denied MRI, and she is out of pain medication.   MD please advise.

## 2015-11-07 NOTE — Telephone Encounter (Signed)
Prescription printed and patient made aware to come to office to pick up.   Referral orders placed.

## 2015-11-07 NOTE — Telephone Encounter (Signed)
Okay with refill on Percocet, 5/325 one every 4 hours as needed. I will give her 30 tablets. However I would consult neurosurgery if Medicaid will not allow us the MRI of her neck. Perhaps they will listen to a neurosurgeon.

## 2015-11-08 ENCOUNTER — Other Ambulatory Visit: Payer: Self-pay | Admitting: *Deleted

## 2015-11-08 MED ORDER — ATORVASTATIN CALCIUM 40 MG PO TABS: 40.0000 mg | ORAL_TABLET | Freq: Every day | ORAL | Status: DC

## 2015-11-08 NOTE — Telephone Encounter (Signed)
Received fax from pharmacy requesting clarification on Lipitor.   Prescription sent to pharmacy.

## 2015-11-16 ENCOUNTER — Telehealth: Payer: Self-pay | Admitting: Family Medicine

## 2015-11-16 NOTE — Telephone Encounter (Signed)
Patient asking for rx for her percocet  [phone]

## 2015-11-16 NOTE — Telephone Encounter (Signed)
Ok to refill 

## 2015-11-17 ENCOUNTER — Telehealth: Payer: Self-pay | Admitting: Family Medicine

## 2015-11-17 MED ORDER — FLUCONAZOLE 150 MG PO TABS: 150.0000 mg | ORAL_TABLET | Freq: Once | ORAL | Status: DC

## 2015-11-17 MED ORDER — OXYCODONE-ACETAMINOPHEN 5-325 MG PO TABS: 1.0000 | ORAL_TABLET | ORAL | Status: DC | PRN

## 2015-11-17 NOTE — Telephone Encounter (Signed)
Patient is calling to get rx sent in for diflucan, the jardiance  Giving her yeast infection  [phone] [facility] [place]

## 2015-11-17 NOTE — Telephone Encounter (Signed)
Ok, but needs x ray of the neck and to see neurosurgery.  Medicaid denied MRI of the neck.  However, her symptoms sound neuropathic.

## 2015-11-17 NOTE — Telephone Encounter (Signed)
Patient called back she states that she has a prescription for gabapentin that has 3 refills left on it but the pharmacy states it is too early to get refilled. She would like to know if she was given enough pills for a month.   CB# [phone]

## 2015-11-17 NOTE — Telephone Encounter (Signed)
Tried to call pt and unable to reach - Rx for percocet printed signed and up front - diflucan has been sent.

## 2015-11-17 NOTE — Telephone Encounter (Signed)
Tried to call pt and message states that the person you have tried to call is unavailable please try again later.

## 2015-11-17 NOTE — Telephone Encounter (Signed)
She had a prescription written 10/28/15 for gabapentin 300 mg 3 x a day (90) pills.

## 2015-11-18 NOTE — Telephone Encounter (Signed)
Diflucan sent to pharm and pt aware that the gabapentin can not be filled early d/t her ins.

## 2015-11-18 NOTE — Telephone Encounter (Signed)
rx picked up on 11/17/15 and pt has appt with neuro in june

## 2015-12-01 ENCOUNTER — Other Ambulatory Visit: Payer: Self-pay | Admitting: Family Medicine

## 2015-12-01 ENCOUNTER — Other Ambulatory Visit: Payer: Self-pay | Admitting: *Deleted

## 2015-12-01 ENCOUNTER — Telehealth: Payer: Self-pay | Admitting: Family Medicine

## 2015-12-01 NOTE — Telephone Encounter (Signed)
Prescription sent to pharmacy for Diflucan. Advised that if S/Sx do not resolve after dosage, OV will be required.  

## 2015-12-01 NOTE — Telephone Encounter (Signed)
Patient is calling to talk to a nurse regarding some side effects she is having from medication  [phone]

## 2015-12-02 NOTE — Telephone Encounter (Signed)
Spoke with patient on 12/01/2015.  Reports that she has been taking Prednisone and FSBS have been elevated. Since FSBS are elevated, she has vaginal itching and thick white discharge.   Prescription sent to pharmacy for Diflucan. Advised that if S/Sx do not resolve after dosage, OV will be required.

## 2015-12-16 ENCOUNTER — Ambulatory Visit (INDEPENDENT_AMBULATORY_CARE_PROVIDER_SITE_OTHER): Payer: Medicaid Other | Admitting: Family Medicine

## 2015-12-16 ENCOUNTER — Encounter: Payer: Self-pay | Admitting: Family Medicine

## 2015-12-16 ENCOUNTER — Other Ambulatory Visit: Payer: Self-pay | Admitting: Neurosurgery

## 2015-12-16 VITALS — BP 118/88 | HR 80 | Temp 98.5°F | Resp 16 | Ht 61.0 in | Wt 243.0 lb

## 2015-12-16 DIAGNOSIS — E1100 Type 2 diabetes mellitus with hyperosmolarity without nonketotic hyperglycemic-hyperosmolar coma (NKHHC): Secondary | ICD-10-CM

## 2015-12-16 MED ORDER — EMPAGLIFLOZIN 25 MG PO TABS: 25.0000 mg | ORAL_TABLET | Freq: Every day | ORAL | Status: DC

## 2015-12-16 MED ORDER — INSULIN PEN NEEDLE 31G X 5 MM MISC: Status: DC

## 2015-12-16 MED ORDER — FLUCONAZOLE 150 MG PO TABS: 150.0000 mg | ORAL_TABLET | Freq: Once | ORAL | Status: DC

## 2015-12-16 NOTE — Progress Notes (Signed)
Subjective:    Patient ID: Kimberly [name], female    DOB: [birth year], 45 y.o.   MRN: [id number]  HPI  Proximally 6 weeks ago sugars are under control. The highest blood sugar the patient's all is approximately 140. She was on a combination of metformin and jardiance.  However she was started on prednisone for a pinched nerve in her neck. After that point her sugars have spiraled out of control. There are now 400-600. She reports polyuria, polydipsia, and blurred vision. Today her sugars are 400. She denies any chest pain although she does report dry mouth. She is still taking metformin and jardiance. Past Medical History  Diagnosis Date  . Obesity   . Pseudotumor cerebri   . Anxiety   . Depression   . Hypertension   . SVD (spontaneous vaginal delivery)     x 3  . History of kidney stones     passed stone - no surgery  . Migraine     otc med prn  . Tension headache   . Abnormal vaginal Pap smear     tx with cryotherapy  . Diabetes mellitus without complication [facility])    Past Surgical History  Procedure Laterality Date  . Cryotherapy    . Mandible fracture surgery  2002    x 1  . Wisdom tooth extraction    . Tubal ligation  11/2003  . Laparoscopic assisted vaginal hysterectomy Bilateral 04/21/2014    Procedure: LAPAROSCOPIC ASSISTED VAGINAL HYSTERECTOMY, BILATERAL SALPINGECTOMY;  Surgeon: Lavina Hamman, MD;  Location: [facility];  Service: Gynecology;  Laterality: Bilateral;   Current Outpatient Prescriptions on File Prior to Visit  Medication Sig Dispense Refill  . acetaZOLAMIDE (DIAMOX) 250 MG tablet Take 250 mg by mouth 2 (two) times daily.    Marland Kitchen ALPRAZolam (XANAX) 1 MG tablet Take 1 mg by mouth 3 (three) times daily as needed for anxiety. Reported on 10/28/2015    . atenolol (TENORMIN) 100 MG tablet Take 100 mg by mouth at bedtime.     Marland Kitchen atorvastatin (LIPITOR) 40 MG tablet Take 1 tablet (40 mg total) by mouth daily. 30 tablet 3  . buPROPion (WELLBUTRIN SR) 150 MG 12 hr tablet  Take 150 mg by mouth at bedtime.     . empagliflozin (JARDIANCE) 25 MG TABS tablet Take 25 mg by mouth at bedtime. Reported on 10/28/2015    . escitalopram (LEXAPRO) 10 MG tablet Take 10 mg by mouth at bedtime.     . gabapentin (NEURONTIN) 300 MG capsule Take 1 capsule (300 mg total) by mouth 3 (three) times daily. 90 capsule 3  . metFORMIN (GLUCOPHAGE) 1000 MG tablet Take 1,000 mg by mouth 2 (two) times daily with a meal.    . oxyCODONE-acetaminophen (ROXICET) 5-325 MG tablet Take 1 tablet by mouth every 4 (four) hours as needed for moderate pain. 30 tablet 0   No current facility-administered medications on file prior to visit.   No Known Allergies Social History   Social History  . Marital Status: Divorced    Spouse Name: N/A  . Number of Children: N/A  . Years of Education: N/A   Occupational History  . Not on file.   Social History Main Topics  . Smoking status: Never Smoker   . Smokeless tobacco: Never Used  . Alcohol Use: 0.6 oz/week    1 Glasses of wine per week     Comment: one glass of wine per week  . Drug Use: No  . Sexual Activity: Yes  Birth Control/ Protection: Surgical   Other Topics Concern  . Not on file   Social History Narrative     Review of Systems  All other systems reviewed and are negative.      Objective:   Physical Exam  Constitutional: She appears well-developed and well-nourished. No distress.  Neck: Neck supple.  Cardiovascular: Normal rate and regular rhythm.   Pulmonary/Chest: Effort normal and breath sounds normal.  Abdominal: Soft. Bowel sounds are normal.  Skin: She is not diaphoretic.  Vitals reviewed.         Assessment & Plan:  Uncontrolled type 2 diabetes mellitus with hyperosmolarity without coma, without long-term current use of insulin (HCC)  Sugars are uncontrolled and I believe we need to start insulin to gain some sense of control. Add glargine 7 units daily today. The patient will check her fasting blood sugar  every morning and increase by 1 unit every day until fasting blood sugars are under 130. She will call me back next week and notify me of the sugar value so we can make gross adjustments. I anticipate that she may require anywhere from 30-60 units total of glargine. We will see how she responds to low-dose insulin over the weekend

## 2015-12-16 NOTE — Addendum Note (Signed)
Addended by: Legrand RamsWILLIS, Valera Vallas B on: 12/16/2015 02:45 PM   Modules accepted: Orders

## 2015-12-19 ENCOUNTER — Encounter: Payer: Self-pay | Admitting: Family Medicine

## 2015-12-19 NOTE — Telephone Encounter (Signed)
Error

## 2015-12-20 NOTE — Telephone Encounter (Signed)
This encounter was created in error - please disregard.

## 2015-12-22 ENCOUNTER — Telehealth: Payer: Self-pay | Admitting: Family Medicine

## 2015-12-22 NOTE — Telephone Encounter (Signed)
Submitted PA through [employer] - suspended   [employer] # [id number]

## 2015-12-23 NOTE — Telephone Encounter (Signed)
Can you send in one of [facility]. Jardience was denied and her BS not controlled on metformin.

## 2015-12-28 ENCOUNTER — Inpatient Hospital Stay (HOSPITAL_COMMUNITY)
Admission: RE | Admit: 2015-12-28 | Discharge: 2015-12-28 | Disposition: A | Discharge status: Home / Self Care | Payer: Medicaid Other | Source: Ambulatory Visit

## 2015-12-29 ENCOUNTER — Telehealth: Payer: Self-pay | Admitting: Family Medicine

## 2015-12-29 NOTE — Telephone Encounter (Signed)
At last visit, I D/C oral meds and:  Sugars are uncontrolled and I believe we need to start insulin to gain some sense of control. Add glargine 7 units daily today. The patient will check her fasting blood sugar every morning and increase by 1 unit every day until fasting blood sugars are under 130. She will call me back next week and notify me of the sugar value so we can make gross adjustments. I anticipate that she may require anywhere from 30-60 units total of glargine. We will see how she responds to low-dose insulin over the weekend  She never called me back about sugars.  How are they doing so I can titrate insulin further.

## 2015-12-29 NOTE — Telephone Encounter (Signed)
PA denied because Jardiance requires trial and failure of MTF, and contraindication to/ failure of Invokana. Patient has failure of MTF, but has not tried Invokana.   Per chart notes, London PepperJardiance is also causing increased yeast infections.   MD please advise.

## 2015-12-30 ENCOUNTER — Other Ambulatory Visit (HOSPITAL_COMMUNITY): Payer: Self-pay | Admitting: *Deleted

## 2015-12-30 ENCOUNTER — Other Ambulatory Visit: Payer: Self-pay | Admitting: Family Medicine

## 2015-12-30 ENCOUNTER — Encounter (HOSPITAL_COMMUNITY)
Admission: RE | Admit: 2015-12-30 | Discharge: 2015-12-30 | Disposition: A | Discharge status: Home / Self Care | Payer: Medicaid Other | Source: Ambulatory Visit | Attending: Neurosurgery | Admitting: Neurosurgery

## 2015-12-30 ENCOUNTER — Encounter (HOSPITAL_COMMUNITY): Payer: Self-pay

## 2015-12-30 DIAGNOSIS — Z01812 Encounter for preprocedural laboratory examination: Secondary | ICD-10-CM | POA: Insufficient documentation

## 2015-12-30 DIAGNOSIS — G932 Benign intracranial hypertension: Secondary | ICD-10-CM | POA: Insufficient documentation

## 2015-12-30 DIAGNOSIS — Z01818 Encounter for other preprocedural examination: Secondary | ICD-10-CM | POA: Insufficient documentation

## 2015-12-30 DIAGNOSIS — Z79899 Other long term (current) drug therapy: Secondary | ICD-10-CM | POA: Insufficient documentation

## 2015-12-30 DIAGNOSIS — Z794 Long term (current) use of insulin: Secondary | ICD-10-CM | POA: Diagnosis not present

## 2015-12-30 DIAGNOSIS — M4802 Spinal stenosis, cervical region: Secondary | ICD-10-CM | POA: Diagnosis not present

## 2015-12-30 DIAGNOSIS — I10 Essential (primary) hypertension: Secondary | ICD-10-CM | POA: Diagnosis not present

## 2015-12-30 DIAGNOSIS — E119 Type 2 diabetes mellitus without complications: Secondary | ICD-10-CM | POA: Insufficient documentation

## 2015-12-30 LAB — CBC
HCT: 47.4 % — ABNORMAL HIGH (ref 36.0–46.0)
Hemoglobin: 15 g/dL (ref 12.0–15.0)
MCH: 29.2 pg (ref 26.0–34.0)
MCHC: 31.6 g/dL (ref 30.0–36.0)
MCV: 92.2 fL (ref 78.0–100.0)
Platelets: 175 10*3/uL (ref 150–400)
RBC: 5.14 MIL/uL — ABNORMAL HIGH (ref 3.87–5.11)
RDW: 13.7 % (ref 11.5–15.5)
WBC: 7.2 10*3/uL (ref 4.0–10.5)

## 2015-12-30 LAB — GLUCOSE, CAPILLARY: Glucose-Capillary: 182 mg/dL — ABNORMAL HIGH (ref 65–99)

## 2015-12-30 LAB — SURGICAL PCR SCREEN
MRSA, PCR: NEGATIVE
Staphylococcus aureus: POSITIVE — AB

## 2015-12-30 LAB — BASIC METABOLIC PANEL
Anion gap: 7 (ref 5–15)
BUN: 16 mg/dL (ref 6–20)
CO2: 20 mmol/L — ABNORMAL LOW (ref 22–32)
Calcium: 9.3 mg/dL (ref 8.9–10.3)
Chloride: 109 mmol/L (ref 101–111)
Creatinine, Ser: 0.88 mg/dL (ref 0.44–1.00)
GFR calc Af Amer: >60 mL/min (ref >60)
GFR calc non Af Amer: >60 mL/min (ref >60)
Glucose, Bld: 176 mg/dL — ABNORMAL HIGH (ref 65–99)
Potassium: 3.8 mmol/L (ref 3.5–5.1)
Sodium: 136 mmol/L (ref 135–145)

## 2015-12-30 MED ORDER — CANAGLIFLOZIN 100 MG PO TABS: 100.0000 mg | ORAL_TABLET | Freq: Every day | ORAL | Status: DC

## 2015-12-30 NOTE — Progress Notes (Signed)
Spoke with Diabetes Coordinator concerning insulin dosage the night before surgery. She recommended that the pt. Take 12  Units glargine  Insulin the night before surgery.The coordinator asked that  I okay this with anesthesia,spoke with Marylene LandAngela NP and she said that that was okay.

## 2015-12-30 NOTE — Telephone Encounter (Signed)
PA submitted via [employer].   PA approved 12/30/2015- 12/29/2016.  PA# [id number].

## 2015-12-30 NOTE — Telephone Encounter (Signed)
Pt has been taking metformin and jardience and using insulin. She is having surgery Tuesday. Jardience was denied but they will cover invokanna with PA and will approve since on metformin. Pt aware to continue meds and switch jardience to Invokanna 100mg .

## 2015-12-30 NOTE — Progress Notes (Signed)
Pt. Notified of results of PCR. Rx. Called to [facility] [address].

## 2015-12-30 NOTE — Pre-Procedure Instructions (Signed)
Kimberly [name]  12/30/2015      [id number] [place] - [address] [place] [id number] Phone: [phone] Fax: [phone]    Your procedure is scheduled on 01-03-2016  Tuesday   [facility] Admitting at 5:30  A.M.   Call this number if you have problems the morning of surgery:  [phone]   Remember:  Do not eat food or drink liquids after midnight.   Take these medicines the morning of surgery with A SIP OF WATER Diamox,alaprazolam(Xanax),Pain medication as needed             STOP ASPIRIN,ANTIINFLAMATORIES (IBUPROFEN,ALEVE,MOTRIN,ADVIL,GOODY'S POWDERS),HERBAL SUPPLEMENTS,FISH OIL,AND VITAMINS 5-7 DAYS PRIOR TO SURGERY                How to Manage Your Diabetes Before and After Surgery  Why is it important to control my blood sugar before and after surgery? . Improving blood sugar levels before and after surgery helps healing and can limit problems. . A way of improving blood sugar control is eating a healthy diet by: o  Eating less sugar and carbohydrates o  Increasing activity/exercise o  Talking with your doctor about reaching your blood sugar goals . High blood sugars (greater than 180 mg/dL) can raise your risk of infections and slow your recovery, so you will need to focus on controlling your diabetes during the weeks before surgery. . Make sure that the doctor who takes care of your diabetes knows about your planned surgery including the date and location.  How do I manage my blood sugar before surgery? . Check your blood sugar at least 4 times a day, starting 2 days before surgery, to make sure that the level is not too high or low. o Check your blood sugar the morning of your surgery when you wake up and every 2 hours until you get to the Short Stay unit. . If your blood sugar is less than 70 mg/dL, you will need to treat for low blood sugar: o Do not take insulin. o Treat a low blood  sugar (less than 70 mg/dL) with  cup of clear juice (cranberry or apple), 4 glucose tablets, OR glucose gel. o Recheck blood sugar in 15 minutes after treatment (to make sure it is greater than 70 mg/dL). If your blood sugar is not greater than 70 mg/dL on recheck, call [phone] for further instructions. . Report your blood sugar to the short stay nurse when you get to Short Stay.  . If you are admitted to the hospital after surgery: o Your blood sugar will be checked by the staff and you will probably be given insulin after surgery (instead of oral diabetes medicines) to make sure you have good blood sugar levels. o The goal for blood sugar control after surgery is 80-180 mg/dL.              WHAT DO I DO ABOUT MY DIABETES MEDICATION?   Marland Kitchen. Do not take oral diabetes medicines (pills) the morning of surgery.  . THE NIGHT BEFORE SURGERY, take ___12_ units of  glargine_insulin.        Reviewed and Endorsed by [facility] Patient Education Committee, August 2015   Do not wear jewelry, make-up or nail polish.  Do not wear lotions, powders, or perfumes.  You may not wear deodorant.     Do not shave 48 hours prior to surgery.   .  Do not bring valuables  to the hospital.  [facility] is not responsible for any belongings or valuables.  Contacts, dentures or bridgework may not be worn into surgery.  Leave your suitcase in the car.  After surgery it may be brought to your room.  For patients admitted to the hospital, discharge time will be determined by your treatment team.  Patients discharged the day of surgery will not be allowed to drive home.    Special instructions:  See attached Sheet for instructions on CHG shower  Please read over the following fact sheets that you were given. MRSA Information and Surgical Site Infection Prevention

## 2015-12-30 NOTE — Telephone Encounter (Signed)
Refill appropriate and filled per protocol. 

## 2015-12-31 LAB — HEMOGLOBIN A1C
Hgb A1c MFr Bld: 10.5 % — ABNORMAL HIGH (ref 4.8–5.6)
Mean Plasma Glucose: 255 mg/dL

## 2016-01-02 ENCOUNTER — Telehealth: Payer: Self-pay | Admitting: Family Medicine

## 2016-01-02 ENCOUNTER — Other Ambulatory Visit: Payer: Self-pay | Admitting: Family Medicine

## 2016-01-02 MED ORDER — CHLORHEXIDINE GLUCONATE CLOTH 2 % EX PADS: 6.0000 | MEDICATED_PAD | Freq: Once | CUTANEOUS | Status: DC

## 2016-01-02 MED ORDER — DEXTROSE 5 % IV SOLN
3.0000 g | INTRAVENOUS | Status: AC
  Administered 2016-01-03: 3 g via INTRAVENOUS
  Filled 2016-01-02: qty 3000

## 2016-01-02 NOTE — Progress Notes (Signed)
Anesthesia Chart Review:  Pt is a 45 year old female scheduled for C5-6, C6-7 ACDF on 01/03/2016 with Lisbeth RenshawNeelesh Nundkumar, MD.   PMH includes:  HTN, DM, pseudotumor cerebri. Never smoker. BMI 47. S/p hysterectomy.   Medications include: atenolol, canagliflozin, lantus, metformin  Preoperative labs reviewed.  HgbA1c 10.5, glucose 176  Chest x-ray 10/26/15 reviewed. No active cardiopulmonary disease  EKG 10/26/15: Sinus rhythm. Low voltage, precordial leads. Minimal ST depression, anterior leads  PCP is Lynnea FerrierWarren Pickard, MD.  His notes in Epic indicate pt's DM was moderately well controlled 2 months ago, but after pt was started on prednisone for a pinched nerve, blood sugars have been much higher (400-600). Pt was started on lantus 12/29/15 and pt is slowly titrating dose up to achieve glycemic control.  Blood sugars not yet controlled. Notified Vanessa in Dr. Val RilesNundkumar's office.   If no changes, I anticipate pt can proceed with surgery as scheduled.   Rica Mastngela Derald Lorge, FNP-BC [facility]iology Phone: [phone] 01/02/2016 1:15 PM

## 2016-01-02 NOTE — Telephone Encounter (Signed)
Pt would like to speak with a nurse about her dm medication before her surgery tomorrow morning. Please call [phone]

## 2016-01-03 ENCOUNTER — Ambulatory Visit (HOSPITAL_COMMUNITY): Payer: Medicaid Other | Admitting: Anesthesiology

## 2016-01-03 ENCOUNTER — Encounter (HOSPITAL_COMMUNITY): Payer: Self-pay | Admitting: *Deleted

## 2016-01-03 ENCOUNTER — Encounter (HOSPITAL_COMMUNITY)
Admission: RE | Disposition: A | Discharge status: Home / Self Care | Payer: Self-pay | Source: Ambulatory Visit | Attending: Neurosurgery

## 2016-01-03 ENCOUNTER — Ambulatory Visit (HOSPITAL_COMMUNITY)
Admission: RE | Admit: 2016-01-03 | Discharge: 2016-01-03 | Disposition: A | Discharge status: Home / Self Care | Payer: Medicaid Other | Source: Ambulatory Visit | Attending: Neurosurgery | Admitting: Neurosurgery

## 2016-01-03 ENCOUNTER — Ambulatory Visit (HOSPITAL_COMMUNITY): Payer: Medicaid Other

## 2016-01-03 ENCOUNTER — Ambulatory Visit (HOSPITAL_COMMUNITY): Payer: Medicaid Other | Admitting: Emergency Medicine

## 2016-01-03 DIAGNOSIS — Z794 Long term (current) use of insulin: Secondary | ICD-10-CM | POA: Diagnosis not present

## 2016-01-03 DIAGNOSIS — Z6841 Body Mass Index (BMI) 40.0 and over, adult: Secondary | ICD-10-CM | POA: Diagnosis not present

## 2016-01-03 DIAGNOSIS — I10 Essential (primary) hypertension: Secondary | ICD-10-CM | POA: Diagnosis not present

## 2016-01-03 DIAGNOSIS — E119 Type 2 diabetes mellitus without complications: Secondary | ICD-10-CM | POA: Diagnosis not present

## 2016-01-03 DIAGNOSIS — M4722 Other spondylosis with radiculopathy, cervical region: Secondary | ICD-10-CM | POA: Diagnosis present

## 2016-01-03 DIAGNOSIS — Z419 Encounter for procedure for purposes other than remedying health state, unspecified: Secondary | ICD-10-CM

## 2016-01-03 HISTORY — PX: ANTERIOR CERVICAL DECOMP/DISCECTOMY FUSION: SHX1161

## 2016-01-03 LAB — GLUCOSE, CAPILLARY
Glucose-Capillary: 228 mg/dL — ABNORMAL HIGH (ref 65–99)
Glucose-Capillary: 244 mg/dL — ABNORMAL HIGH (ref 65–99)
Glucose-Capillary: 251 mg/dL — ABNORMAL HIGH (ref 65–99)
Glucose-Capillary: 364 mg/dL — ABNORMAL HIGH (ref 65–99)

## 2016-01-03 SURGERY — ANTERIOR CERVICAL DECOMPRESSION/DISCECTOMY FUSION 2 LEVELS
Anesthesia: General | Site: Spine Cervical

## 2016-01-03 MED ORDER — SODIUM CHLORIDE 0.9% FLUSH: 3.0000 mL | INTRAVENOUS | Status: DC | PRN

## 2016-01-03 MED ORDER — SODIUM CHLORIDE 0.9% FLUSH: 3.0000 mL | Freq: Two times a day (BID) | INTRAVENOUS | Status: DC

## 2016-01-03 MED ORDER — FENTANYL CITRATE (PF) 250 MCG/5ML IJ SOLN
INTRAMUSCULAR | Status: AC
  Filled 2016-01-03: qty 5

## 2016-01-03 MED ORDER — LACTATED RINGERS IV SOLN
INTRAVENOUS | Status: DC | PRN
  Administered 2016-01-03 (×2): via INTRAVENOUS

## 2016-01-03 MED ORDER — ONDANSETRON HCL 4 MG/2ML IJ SOLN: 4.0000 mg | INTRAMUSCULAR | Status: DC | PRN

## 2016-01-03 MED ORDER — GLYCOPYRROLATE 0.2 MG/ML IV SOSY
PREFILLED_SYRINGE | INTRAVENOUS | Status: AC
  Filled 2016-01-03: qty 6

## 2016-01-03 MED ORDER — NALOXONE HCL 0.4 MG/ML IJ SOLN
INTRAMUSCULAR | Status: AC
  Filled 2016-01-03: qty 1

## 2016-01-03 MED ORDER — ONDANSETRON HCL 4 MG/2ML IJ SOLN
INTRAMUSCULAR | Status: AC
  Filled 2016-01-03: qty 4

## 2016-01-03 MED ORDER — ESCITALOPRAM OXALATE 10 MG PO TABS
10.0000 mg | ORAL_TABLET | Freq: Every day | ORAL | Status: DC
  Filled 2016-01-03: qty 1

## 2016-01-03 MED ORDER — PROMETHAZINE HCL 25 MG/ML IJ SOLN: 6.2500 mg | INTRAMUSCULAR | Status: DC | PRN

## 2016-01-03 MED ORDER — LIDOCAINE 2% (20 MG/ML) 5 ML SYRINGE
INTRAMUSCULAR | Status: DC | PRN
  Administered 2016-01-03: 60 mg via INTRAVENOUS

## 2016-01-03 MED ORDER — 0.9 % SODIUM CHLORIDE (POUR BTL) OPTIME
TOPICAL | Status: DC | PRN
  Administered 2016-01-03: 1000 mL

## 2016-01-03 MED ORDER — POLYETHYLENE GLYCOL 3350 17 G PO PACK: 17.0000 g | PACK | Freq: Every day | ORAL | Status: DC | PRN

## 2016-01-03 MED ORDER — ROCURONIUM BROMIDE 50 MG/5ML IV SOLN
INTRAVENOUS | Status: AC
  Filled 2016-01-03: qty 2

## 2016-01-03 MED ORDER — THROMBIN 20000 UNITS EX SOLR
CUTANEOUS | Status: DC | PRN
  Administered 2016-01-03: 20 mL via TOPICAL

## 2016-01-03 MED ORDER — ACETAMINOPHEN 500 MG PO TABS
1000.0000 mg | ORAL_TABLET | Freq: Once | ORAL | Status: AC
Start: 2016-01-03 — End: 2016-01-03
  Administered 2016-01-03: 1000 mg via ORAL
  Filled 2016-01-03 (×2): qty 2

## 2016-01-03 MED ORDER — HYDROMORPHONE HCL 1 MG/ML IJ SOLN: 0.2500 mg | INTRAMUSCULAR | Status: DC | PRN

## 2016-01-03 MED ORDER — HYDROMORPHONE HCL 1 MG/ML IJ SOLN: 0.5000 mg | INTRAMUSCULAR | Status: DC | PRN

## 2016-01-03 MED ORDER — LIDOCAINE 2% (20 MG/ML) 5 ML SYRINGE
INTRAMUSCULAR | Status: AC
  Filled 2016-01-03: qty 10

## 2016-01-03 MED ORDER — ONDANSETRON HCL 4 MG/2ML IJ SOLN
INTRAMUSCULAR | Status: DC | PRN
  Administered 2016-01-03: 4 mg via INTRAVENOUS

## 2016-01-03 MED ORDER — PROPOFOL 10 MG/ML IV BOLUS
INTRAVENOUS | Status: AC
  Filled 2016-01-03: qty 40

## 2016-01-03 MED ORDER — INSULIN ASPART 100 UNIT/ML ~~LOC~~ SOLN
0.0000 [IU] | Freq: Three times a day (TID) | SUBCUTANEOUS | Status: DC
  Administered 2016-01-03: 20 [IU] via SUBCUTANEOUS

## 2016-01-03 MED ORDER — PHENYLEPHRINE 40 MCG/ML (10ML) SYRINGE FOR IV PUSH (FOR BLOOD PRESSURE SUPPORT)
PREFILLED_SYRINGE | INTRAVENOUS | Status: AC
  Filled 2016-01-03: qty 10

## 2016-01-03 MED ORDER — OXYCODONE-ACETAMINOPHEN 5-325 MG PO TABS
1.0000 | ORAL_TABLET | ORAL | Status: DC | PRN
  Administered 2016-01-03 (×2): 2 via ORAL
  Filled 2016-01-03 (×2): qty 2

## 2016-01-03 MED ORDER — METHOCARBAMOL 1000 MG/10ML IJ SOLN
500.0000 mg | Freq: Four times a day (QID) | INTRAVENOUS | Status: DC | PRN
  Filled 2016-01-03: qty 5

## 2016-01-03 MED ORDER — OXYCODONE HCL 5 MG/5ML PO SOLN: 5.0000 mg | Freq: Once | ORAL | Status: DC | PRN

## 2016-01-03 MED ORDER — NEOSTIGMINE METHYLSULFATE 5 MG/5ML IV SOSY
PREFILLED_SYRINGE | INTRAVENOUS | Status: AC
  Filled 2016-01-03: qty 10

## 2016-01-03 MED ORDER — METFORMIN HCL 500 MG PO TABS
1000.0000 mg | ORAL_TABLET | Freq: Two times a day (BID) | ORAL | Status: DC
  Administered 2016-01-03: 1000 mg via ORAL
  Filled 2016-01-03: qty 2

## 2016-01-03 MED ORDER — SODIUM CHLORIDE 0.9 % IR SOLN
Status: DC | PRN
  Administered 2016-01-03: 500 mL

## 2016-01-03 MED ORDER — SODIUM CHLORIDE 0.9 % IV SOLN: INTRAVENOUS | Status: DC

## 2016-01-03 MED ORDER — DEXAMETHASONE SODIUM PHOSPHATE 10 MG/ML IJ SOLN
INTRAMUSCULAR | Status: AC
  Filled 2016-01-03: qty 1

## 2016-01-03 MED ORDER — CANAGLIFLOZIN 100 MG PO TABS
100.0000 mg | ORAL_TABLET | Freq: Every day | ORAL | Status: DC
Start: 2016-01-04 — End: 2016-01-04
  Filled 2016-01-03: qty 1

## 2016-01-03 MED ORDER — ACETAMINOPHEN 325 MG PO TABS: 650.0000 mg | ORAL_TABLET | ORAL | Status: DC | PRN

## 2016-01-03 MED ORDER — SENNA 8.6 MG PO TABS: 1.0000 | ORAL_TABLET | Freq: Two times a day (BID) | ORAL | Status: DC

## 2016-01-03 MED ORDER — DOCUSATE SODIUM 100 MG PO CAPS: 100.0000 mg | ORAL_CAPSULE | Freq: Two times a day (BID) | ORAL | Status: DC

## 2016-01-03 MED ORDER — BISACODYL 10 MG RE SUPP: 10.0000 mg | Freq: Every day | RECTAL | Status: DC | PRN

## 2016-01-03 MED ORDER — ALPRAZOLAM 0.5 MG PO TABS: 1.0000 mg | ORAL_TABLET | Freq: Three times a day (TID) | ORAL | Status: DC | PRN

## 2016-01-03 MED ORDER — FENTANYL CITRATE (PF) 100 MCG/2ML IJ SOLN
INTRAMUSCULAR | Status: DC | PRN
  Administered 2016-01-03 (×3): 50 ug via INTRAVENOUS
  Administered 2016-01-03: 100 ug via INTRAVENOUS
  Administered 2016-01-03 (×3): 50 ug via INTRAVENOUS

## 2016-01-03 MED ORDER — MENTHOL 3 MG MT LOZG: 1.0000 | LOZENGE | OROMUCOSAL | Status: DC | PRN

## 2016-01-03 MED ORDER — INSULIN GLARGINE 100 UNIT/ML ~~LOC~~ SOLN
7.0000 [IU] | Freq: Every day | SUBCUTANEOUS | Status: DC
  Filled 2016-01-03: qty 0.07

## 2016-01-03 MED ORDER — PHENOL 1.4 % MT LIQD: 1.0000 | OROMUCOSAL | Status: DC | PRN

## 2016-01-03 MED ORDER — NEOSTIGMINE METHYLSULFATE 5 MG/5ML IV SOSY
PREFILLED_SYRINGE | INTRAVENOUS | Status: DC | PRN
  Administered 2016-01-03: 2 mg via INTRAVENOUS

## 2016-01-03 MED ORDER — LIDOCAINE-EPINEPHRINE 1 %-1:100000 IJ SOLN
INTRAMUSCULAR | Status: DC | PRN
  Administered 2016-01-03: 4.5 mL

## 2016-01-03 MED ORDER — BUPIVACAINE HCL 0.5 % IJ SOLN
INTRAMUSCULAR | Status: DC | PRN
  Administered 2016-01-03: 4.5 mL

## 2016-01-03 MED ORDER — BUPROPION HCL ER (SR) 150 MG PO TB12: 150.0000 mg | ORAL_TABLET | Freq: Two times a day (BID) | ORAL | Status: DC

## 2016-01-03 MED ORDER — OXYCODONE-ACETAMINOPHEN 10-325 MG PO TABS: 1.0000 | ORAL_TABLET | ORAL | Status: DC | PRN

## 2016-01-03 MED ORDER — EPHEDRINE SULFATE-NACL 50-0.9 MG/10ML-% IV SOSY
PREFILLED_SYRINGE | INTRAVENOUS | Status: DC | PRN
  Administered 2016-01-03: 5 mg via INTRAVENOUS
  Administered 2016-01-03: 10 mg via INTRAVENOUS

## 2016-01-03 MED ORDER — OXYCODONE HCL 5 MG PO TABS: 5.0000 mg | ORAL_TABLET | Freq: Once | ORAL | Status: DC | PRN

## 2016-01-03 MED ORDER — MIDAZOLAM HCL 5 MG/5ML IJ SOLN
INTRAMUSCULAR | Status: DC | PRN
  Administered 2016-01-03 (×2): 1 mg via INTRAVENOUS

## 2016-01-03 MED ORDER — ATENOLOL 100 MG PO TABS
100.0000 mg | ORAL_TABLET | Freq: Every day | ORAL | Status: DC
  Filled 2016-01-03: qty 1

## 2016-01-03 MED ORDER — CEFAZOLIN SODIUM-DEXTROSE 2-4 GM/100ML-% IV SOLN
2.0000 g | Freq: Three times a day (TID) | INTRAVENOUS | Status: DC
  Administered 2016-01-03: 2 g via INTRAVENOUS
  Filled 2016-01-03: qty 100

## 2016-01-03 MED ORDER — ALBUTEROL SULFATE HFA 108 (90 BASE) MCG/ACT IN AERS
INHALATION_SPRAY | RESPIRATORY_TRACT | Status: AC
  Filled 2016-01-03: qty 6.7

## 2016-01-03 MED ORDER — EPHEDRINE 5 MG/ML INJ
INTRAVENOUS | Status: AC
  Filled 2016-01-03: qty 20

## 2016-01-03 MED ORDER — PROPOFOL 10 MG/ML IV BOLUS
INTRAVENOUS | Status: DC | PRN
  Administered 2016-01-03: 200 mg via INTRAVENOUS

## 2016-01-03 MED ORDER — THROMBIN 5000 UNITS EX SOLR
OROMUCOSAL | Status: DC | PRN
  Administered 2016-01-03: 5 mL via TOPICAL

## 2016-01-03 MED ORDER — ACETAMINOPHEN 650 MG RE SUPP: 650.0000 mg | RECTAL | Status: DC | PRN

## 2016-01-03 MED ORDER — DEXAMETHASONE SODIUM PHOSPHATE 10 MG/ML IJ SOLN
INTRAMUSCULAR | Status: DC | PRN
  Administered 2016-01-03: 5 mg via INTRAVENOUS

## 2016-01-03 MED ORDER — ROCURONIUM BROMIDE 50 MG/5ML IV SOLN
INTRAVENOUS | Status: AC
  Filled 2016-01-03: qty 1

## 2016-01-03 MED ORDER — GLYCOPYRROLATE 0.2 MG/ML IV SOSY
PREFILLED_SYRINGE | INTRAVENOUS | Status: DC | PRN
  Administered 2016-01-03: .2 mg via INTRAVENOUS

## 2016-01-03 MED ORDER — MIDAZOLAM HCL 2 MG/2ML IJ SOLN
INTRAMUSCULAR | Status: AC
  Filled 2016-01-03: qty 2

## 2016-01-03 MED ORDER — ACETAZOLAMIDE 250 MG PO TABS
250.0000 mg | ORAL_TABLET | Freq: Two times a day (BID) | ORAL | Status: DC
  Filled 2016-01-03: qty 1

## 2016-01-03 MED ORDER — ROCURONIUM BROMIDE 10 MG/ML (PF) SYRINGE
PREFILLED_SYRINGE | INTRAVENOUS | Status: DC | PRN
  Administered 2016-01-03: 50 mg via INTRAVENOUS
  Administered 2016-01-03: 10 mg via INTRAVENOUS

## 2016-01-03 MED ORDER — FLEET ENEMA 7-19 GM/118ML RE ENEM: 1.0000 | ENEMA | Freq: Once | RECTAL | Status: DC | PRN

## 2016-01-03 MED ORDER — METHOCARBAMOL 500 MG PO TABS
500.0000 mg | ORAL_TABLET | Freq: Four times a day (QID) | ORAL | Status: DC | PRN
  Administered 2016-01-03: 500 mg via ORAL
  Filled 2016-01-03: qty 1

## 2016-01-03 SURGICAL SUPPLY — 68 items
APL SKNCLS STERI-STRIP NONHPOA (GAUZE/BANDAGES/DRESSINGS)
BAG DECANTER FOR FLEXI CONT (MISCELLANEOUS) ×2 IMPLANT
BENZOIN TINCTURE PRP APPL 2/3 (GAUZE/BANDAGES/DRESSINGS) IMPLANT
BLADE CLIPPER SURG (BLADE) IMPLANT
BLADE SURG 11 STRL SS (BLADE) ×2 IMPLANT
BLADE ULTRA TIP 2M (BLADE) ×1 IMPLANT
BNDG GAUZE ELAST 4 BULKY (GAUZE/BANDAGES/DRESSINGS) IMPLANT
BUR MATCHSTICK NEURO 3.0 LAGG (BURR) ×2 IMPLANT
CAGE EXPANSE 8X6X6 (Cage) ×2 IMPLANT
CAGE PEEK 6X14X11 (Cage) ×2 IMPLANT
CANISTER SUCT 3000ML PPV (MISCELLANEOUS) ×2 IMPLANT
DECANTER SPIKE VIAL GLASS SM (MISCELLANEOUS) ×2 IMPLANT
DRAIN CHANNEL 10M FLAT 3/4 FLT (DRAIN) IMPLANT
DRAPE C-ARM 42X72 X-RAY (DRAPES) ×4 IMPLANT
DRAPE LAPAROTOMY 100X72 PEDS (DRAPES) ×2 IMPLANT
DRAPE MICROSCOPE LEICA (MISCELLANEOUS) ×2 IMPLANT
DRAPE POUCH INSTRU U-SHP 10X18 (DRAPES) ×2 IMPLANT
DRAPE PROXIMA HALF (DRAPES) ×1 IMPLANT
DRSG OPSITE POSTOP 3X4 (GAUZE/BANDAGES/DRESSINGS) ×4 IMPLANT
DRSG OPSITE POSTOP 4X6 (GAUZE/BANDAGES/DRESSINGS) ×2 IMPLANT
DURAPREP 6ML APPLICATOR 50/CS (WOUND CARE) ×2 IMPLANT
ELECT COATED BLADE 2.86 ST (ELECTRODE) ×2 IMPLANT
ELECT REM PT RETURN 9FT ADLT (ELECTROSURGICAL) ×2
ELECTRODE REM PT RTRN 9FT ADLT (ELECTROSURGICAL) ×1 IMPLANT
EVACUATOR SILICONE 100CC (DRAIN) IMPLANT
GAUZE SPONGE 4X4 16PLY XRAY LF (GAUZE/BANDAGES/DRESSINGS) IMPLANT
GLOVE BIOGEL PI IND STRL 7.0 (GLOVE) IMPLANT
GLOVE BIOGEL PI IND STRL 7.5 (GLOVE) ×1 IMPLANT
GLOVE BIOGEL PI INDICATOR 7.0 (GLOVE) ×2
GLOVE BIOGEL PI INDICATOR 7.5 (GLOVE) ×1
GLOVE ECLIPSE 7.0 STRL STRAW (GLOVE) ×2 IMPLANT
GLOVE EXAM NITRILE LRG STRL (GLOVE) IMPLANT
GLOVE EXAM NITRILE MD LF STRL (GLOVE) IMPLANT
GLOVE EXAM NITRILE XL STR (GLOVE) IMPLANT
GLOVE EXAM NITRILE XS STR PU (GLOVE) IMPLANT
GLOVE SURG SS PI 6.5 STRL IVOR (GLOVE) ×2 IMPLANT
GOWN STRL REUS W/ TWL LRG LVL3 (GOWN DISPOSABLE) ×2 IMPLANT
GOWN STRL REUS W/ TWL XL LVL3 (GOWN DISPOSABLE) IMPLANT
GOWN STRL REUS W/TWL 2XL LVL3 (GOWN DISPOSABLE) IMPLANT
GOWN STRL REUS W/TWL LRG LVL3 (GOWN DISPOSABLE) ×4
GOWN STRL REUS W/TWL XL LVL3 (GOWN DISPOSABLE)
HEMOSTAT POWDER KIT SURGIFOAM (HEMOSTASIS) ×2 IMPLANT
KIT BASIN OR (CUSTOM PROCEDURE TRAY) ×2 IMPLANT
KIT ROOM TURNOVER OR (KITS) ×2 IMPLANT
LIQUID BAND (GAUZE/BANDAGES/DRESSINGS) ×2 IMPLANT
NDL HYPO 25X1 1.5 SAFETY (NEEDLE) ×1 IMPLANT
NDL SPNL 22GX3.5 QUINCKE BK (NEEDLE) ×1 IMPLANT
NEEDLE HYPO 25X1 1.5 SAFETY (NEEDLE) ×2 IMPLANT
NEEDLE SPNL 22GX3.5 QUINCKE BK (NEEDLE) ×2 IMPLANT
NS IRRIG 1000ML POUR BTL (IV SOLUTION) ×2 IMPLANT
PACK LAMINECTOMY NEURO (CUSTOM PROCEDURE TRAY) ×2 IMPLANT
PAD ARMBOARD 7.5X6 YLW CONV (MISCELLANEOUS) ×6 IMPLANT
PLATE 2 37.5XLCK NS SPNE CVD (Plate) IMPLANT
PLATE 2 ATLANTIS TRANS (Plate) ×2 IMPLANT
RUBBERBAND STERILE (MISCELLANEOUS) ×4 IMPLANT
SCREW FIXED ATLANTIS 4X12MM (Screw) ×3 IMPLANT
SCREW VARIABLE ATLANTIS 4X12MM (Screw) ×3 IMPLANT
SPONGE INTESTINAL PEANUT (DISPOSABLE) ×2 IMPLANT
SPONGE SURGIFOAM ABS GEL 100 (HEMOSTASIS) ×2 IMPLANT
STRIP CLOSURE SKIN 1/2X4 (GAUZE/BANDAGES/DRESSINGS) IMPLANT
SUT ETHILON 3 0 FSL (SUTURE) IMPLANT
SUT VIC AB 3-0 SH 8-18 (SUTURE) ×2 IMPLANT
SUT VICRYL 3-0 RB1 18 ABS (SUTURE) ×3 IMPLANT
TAPE CLOTH 3X10 TAN LF (GAUZE/BANDAGES/DRESSINGS) ×2 IMPLANT
TOWEL OR 17X24 6PK STRL BLUE (TOWEL DISPOSABLE) ×2 IMPLANT
TOWEL OR 17X26 10 PK STRL BLUE (TOWEL DISPOSABLE) ×2 IMPLANT
TRAP SPECIMEN MUCOUS 40CC (MISCELLANEOUS) ×2 IMPLANT
WATER STERILE IRR 1000ML POUR (IV SOLUTION) ×2 IMPLANT

## 2016-01-03 NOTE — Progress Notes (Signed)
Transported per [facility]

## 2016-01-03 NOTE — Telephone Encounter (Signed)
Called and spoke to pt and she states that the pharmacy informed her that the invokana can cause more yeast infections then the Jardience and she is getting them frequently and would like to know if there was something else she could take?? I did suggest lactobacillus acidophilous. Her FBS are still in 140's and she is still increasing her insulin by 1 unit daily.

## 2016-01-03 NOTE — Telephone Encounter (Signed)
If she is worried, stop invokana and we will increase insulin to cover.

## 2016-01-03 NOTE — Progress Notes (Signed)
Orthopedic Tech Progress Note Patient Details:  Kimberly CousinDanielle R Vacha [birth year] [id number]  Ortho Devices Type of Ortho Device: Soft collar Ortho Device/Splint Location: neck Ortho Device/Splint Interventions: Ordered, Application   Jennye MoccasinHughes, Waleska Buttery Craig 01/03/2016, 7:01 PM

## 2016-01-03 NOTE — Discharge Instructions (Signed)
Wound Care  °You may remove outer bandage after 2 days and shower.  °Keep incision open to air. °Do not put any creams, lotions, or ointments on incision. ° °Activity °Walk each and every day, increasing distance each day. °No lifting greater than 5 lbs.  Avoid excessive neck motion. °No driving for 2 weeks; may ride as a passenger locally. °Wear neck brace at all times except when showering. °Diet °Resume your normal diet.  °Return to Work °Will be discussed at you follow up appointment. °Call Your Doctor If Any of These Occur °Redness, drainage, or swelling at the wound.  °Temperature greater than 101 degrees. °Severe pain not relieved by pain medication. °Increased difficulty swallowing.  °Incision starts to come apart. °Follow Up Appt °Call today and ask for appointment in 1-2 weeks ([phone]) or for problems.  If you have any hardware placed in your spine, you will need an x-ray before your appointment. ° °

## 2016-01-03 NOTE — Op Note (Signed)
PREOP DIAGNOSIS: Cervical Spondylosis with radiculopathy, C5-6, C6-7  POSTOP DIAGNOSIS: Same  PROCEDURE: 1. Discectomy at C5-6, C6-7 for decompression of spinal cord and exiting nerve roots  2. Placement of intervertebral biomechanical device, Medtronic 84m PTC PEEK lordotic  3. Placement of anterior instrumentation consisting of interbody plate and screws spanning C5-C7, 37.553mAtlantis Translational plate, 1276mcrews 4. Use of morselized bone allograft  5. Arthrodesis C5-6, C6-7, anterior interbody technique  6. Use of intraoperative microscope  SURGEON: Dr. NeeConsuella LoseD  ASSISTANT: Dr. BenCyndy FreezeD  ANESTHESIA: General Endotracheal  EBL: 100cc  SPECIMENS: None  DRAINS: None  COMPLICATIONS: None immediate  CONDITION: Hemodynamically stable to PACU  HISTORY: Kimberly [name] a 45 31o. with several months of neck and left arm pain. She failed multiple conservative treatments. Her MRI showed foraminal stenosis due to disc-osteophyte complex at C5-6, C6-7. She therefore elected to proceed with surgical decompression and fusion. Risks and benefits were reviewed in detail   PROCEDURE IN DETAIL: The patient was brought to the operating room and transferred to the operative table. After induction of general anesthesia, the patient was positioned on the operative table in the supine position with all pressure points meticulously padded. The skin of the neck was then prepped and draped in the usual sterile fashion.  After timeout was conducted, the skin was infiltrated with local anesthetic. Skin incision was then made sharply and Bovie electrocautery was used to dissect the subcutaneous tissue until the platysma was identified. The platysma was then divided and undermined. The sternocleidomastoid muscle was then identified and, utilizing natural fascial planes in the neck, the prevertebral fascia was identified and the carotid sheath was retracted laterally and the  trachea and esophagus retracted medially. Again using Xray, the correct disc spaces were identified. Bovie electrocautery was used to dissect in the subperiosteal plane and elevate the bilateral longus coli muscles. Self-retaining retractors were then placed. At this point, the microscope was draped and brought into the field, and the remainder of the case was done under the microscope using microdissecting technique.  The C5-6 disc space was incised sharply and rongeurs were use to initially complete a discectomy. The high-speed drill was then used to complete discectomy until the posterior annulus was identified and removed and the posterior longitudinal ligament was identified. Using a nerve hook, the PLL was elevated, and Kerrison rongeurs were used to remove the posterior longitudinal ligament and the ventral thecal sac was identified. Using a combination of curettes and rongeurs, complete decompression of the thecal sac and exiting nerve roots at this level was completed, and verified using micro-nerve hook. The entire nerve root on the left was exposed by removal of the uncovertebral joint. There was significant amount of disc osteophyte causing foraminal stenosis.  At this point, a 6mm3mrdotic PEEK interbody cage was sized and packed with morcellized bone allograft. This was then inserted and tapped into place.  Attention was then turned to the C6-7 level. In a similar fashion, discectomy was completed initially with curettes and rongeurs, and completed with the drill. The PLL was again identified, elevated and incised. Using Kerrison rongeurs, decompression of the spinal cord and exiting roots was completed and confirmed with a dissector. Again, the uncovertebral joint on the left was removed in order to ensure complete decompression of the nerve root.  A 6mm 46mdotic PEEK interbody cage was then sized and filled with bone allograft, and tapped into place. Position of the interbody devices was then  confirmed with  fluoroscopy.  After placement of the intervertebral devices, the anterior cervical plate was selected, and placed across the interspaces. Using a high-speed drill, the cortex of the cervical vertebral bodies was punctured, and screws inserted in the C5, C6, C7 levels. Final fluoroscopic images in AP and lateral projections were taken to confirm good hardware placement.  At this point, after all counts were verified to be correct, meticulous hemostasis was secured using a combination of bipolar electrocautery and passive hemostatics. The platysma muscle was then closed using interrupted 3-0 Vicryl sutures, and the skin was closed with a interrupted subcuticular stitch. Sterile dressings were then applied and the drapes removed.  The patient tolerated the procedure well and was extubated in the room and taken to the postanesthesia care unit in stable condition. At the end of the case all sponge, needle, instrument, and cottonoid counts were correct.

## 2016-01-03 NOTE — Progress Notes (Signed)
Discharged instructions/education/Rx  given to patient and she verbalized understanding. Pain is mild per patient. No redness, no swelling, no drainage noted on incision site. Patient voiding well with no difficulty, walked in hallway with minimal supervision. Tolerated her meal with no problems. Patient waiting for her transport.

## 2016-01-03 NOTE — H&P (Signed)
CC:  Neck and left arm pain  HPI: Kimberly [name] is a 45 year old woman I'm seeing with somewhat severe neck and left-sided arm pain.  She has a several month history of left-sided arm pain, which began without any inciting event. She is describing severe, sharp pain which runs primarily from the left elbow, along the ulnar side of her left forearm, and into her wrist. She also has numbness of the 4 digits in her left hand. She says the pain is extremely severe, despite trying Norco, Percocet, and Neurontin. She says the pain is relieved when she lays down supine, with her arm and hand flat on the bed. Assuming she stands or sits up, the pain returns. She has noted weakness in the left arm, including difficulty holding things in her left hand. She has not noted any significant midline neck pain. She does not have any right arm symptoms. She denies any difficulty walking, balance problems, or falls. She does not have any changes in bowel or bladder function. She has been on prednisone in the past, which provided modest relief of her pain, however significantly elevated her sugars. She says they have been running now in the 400s. She has also been on Neurontin which helps to take the edge off the pain. Nonetheless, she continues to report severe left-sided arm pain rated currently at 7/10   PMH: Past Medical History  Diagnosis Date  . Obesity   . Pseudotumor cerebri   . Anxiety   . Depression   . Hypertension   . SVD (spontaneous vaginal delivery)     x 3  . History of kidney stones     passed stone - no surgery  . Migraine     otc med prn  . Tension headache   . Abnormal vaginal Pap smear     tx with cryotherapy  . Diabetes mellitus without complication (HCC)     PSH: Past Surgical History  Procedure Laterality Date  . Cryotherapy    . Mandible fracture surgery  2002    x 1  . Wisdom tooth extraction    . Tubal ligation  11/2003  . Laparoscopic assisted vaginal hysterectomy Bilateral  04/21/2014    Procedure: LAPAROSCOPIC ASSISTED VAGINAL HYSTERECTOMY, BILATERAL SALPINGECTOMY;  Surgeon: Lavina Hamman, MD;  Location: [facility];  Service: Gynecology;  Laterality: Bilateral;  . Abdominal hysterectomy      SH: Social History  Substance Use Topics  . Smoking status: Never Smoker   . Smokeless tobacco: Never Used  . Alcohol Use: 0.6 oz/week    1 Glasses of wine per week     Comment: one glass of wine per week    MEDS: Prior to Admission medications   Medication Sig Start Date End Date Taking? Authorizing Provider  acetaZOLAMIDE (DIAMOX) 250 MG tablet Take 250 mg by mouth 2 (two) times daily.   Yes Historical Provider, MD  ALPRAZolam Prudy Feeler) 1 MG tablet Take 1 mg by mouth 3 (three) times daily as needed for anxiety. Reported on 10/28/2015   Yes Historical Provider, MD  atenolol (TENORMIN) 100 MG tablet Take 100 mg by mouth at bedtime.    Yes Historical Provider, MD  buPROPion (WELLBUTRIN SR) 150 MG 12 hr tablet Take 150 mg by mouth at bedtime.    Yes Historical Provider, MD  buPROPion (WELLBUTRIN SR) 150 MG 12 hr tablet TAKE 1 TABLET (150 MG TOTAL) BY MOUTH DAILY. THIS IS ONCE DAILY. 12/30/15  Yes Donita Brooks, MD  canagliflozin [facility]) 100 MG  TABS tablet Take 1 tablet (100 mg total) by mouth daily before breakfast. 12/30/15  Yes Donita BrooksWarren T Pickard, MD  escitalopram (LEXAPRO) 10 MG tablet Take 10 mg by mouth at bedtime.    Yes Historical Provider, MD  fluconazole (DIFLUCAN) 150 MG tablet TAKE 1 TABLET BY MOUTH ONCE IF SYMPTOMS DO NOT RESOLVE OFFICE VISIT IS REQUIRED 01/02/16  Yes Donita BrooksWarren T Pickard, MD  HYDROcodone-acetaminophen (NORCO/VICODIN) 5-325 MG tablet Take 1 tablet by mouth every 4 (four) hours as needed for moderate pain.   Yes Historical Provider, MD  insulin glargine (LANTUS) 100 UNIT/ML injection Inject 7 Units into the skin at bedtime. Start at 7 units, patient will check blood sugar every morning,  increase 1 unit every day until fasting is under 130   Yes Historical  Provider, MD  metFORMIN (GLUCOPHAGE) 1000 MG tablet Take 1,000 mg by mouth 2 (two) times daily with a meal.   Yes Historical Provider, MD    ALLERGY: No Known Allergies  ROS: ROS  NEUROLOGIC EXAM: Awake, alert, oriented Memory and concentration grossly intact Speech fluent, appropriate CN grossly intact Motor exam: Upper Extremities Deltoid Bicep Tricep Grip  Right 5/5 5/5 5/5 5/5  Left 5/5 5/5 5/5 4+/5   Lower Extremity IP Quad PF DF EHL  Right 5/5 5/5 5/5 5/5 5/5  Left 5/5 5/5 5/5 5/5 5/5   Sensation grossly intact to LT  IMGAING: MRI of the cervical spine was reviewed. This demonstrates primary pathology at C5 C6 where there is a broad-based disc osteophyte complex with resultant severe right-sided foraminal stenosis and moderately severe left-sided stenosis. In addition, at C6 C7 there is a left eccentric disc herniation with severe stenosis of the left C6 C7 foramen.   IMPRESSION: 45 year old woman with left-sided arm pain related to foraminal stenosis at C5 C6 and C6 C7. Given her diabetes and intolerance of steroids, as well as her motor weakness, I would not recommend epidural sterid injections. I think she would be a good candidate at this point for cervical decompression and fusion.   PLAN: C5 C6 and C6 C7 ACDF   Treatment options at this point were discussed. Continued conservative treatments which would likely only include anti-inflammatory medications given her intolerance to steroids was offered. Given her motor weakness however I did recommend surgical decompression and fusion. The risks of surgery were discussed in detail with the patient which include but are not limited to spinal cord injury which may result in hand, leg, and bowel dysfunction, postoperative dysphagia, dysphonia, neck hematoma, or subsequent surgery for epidural hematoma. The risk of CSF leak was also discussed. In addition, I explained to her that after spinal fusion surgery, there is a risk of  adjacent level disease requiring future surgical intervention. The possibility of continued weakness, pain, or numbness and tingling after surgery was discussed. The patient understood our discussion as well as the risks of the surgery and is willing to proceed. All questions were answered.

## 2016-01-03 NOTE — Anesthesia Procedure Notes (Signed)
Procedure Name: Intubation Date/Time: 01/03/2016 8:09 AM Performed by: Daiva EvesAVENEL, Lizbeth Feijoo W Pre-anesthesia Checklist: Patient identified, Emergency Drugs available, Suction available, Timeout performed and Patient being monitored Patient Re-evaluated:Patient Re-evaluated prior to inductionOxygen Delivery Method: Circle system utilized Preoxygenation: Pre-oxygenation with 100% oxygen Intubation Type: IV induction Ventilation: Mask ventilation without difficulty Laryngoscope Size: Mac and 3 Grade View: Grade II Tube type: Oral Tube size: 7.0 mm Number of attempts: 1 Airway Equipment and Method: Stylet Placement Confirmation: ETT inserted through vocal cords under direct vision Secured at: 21 cm Tube secured with: Tape Dental Injury: Teeth and Oropharynx as per pre-operative assessment

## 2016-01-03 NOTE — Transfer of Care (Signed)
Immediate Anesthesia Transfer of Care Note  Patient: Kimberly [name]  Procedure(s) Performed: Procedure(s) with comments: ANTERIOR CERVICAL DECOMPRESSION FUSION CERVICAL FIVE-SIX,CERVICAL SIX-SEVEN. (N/A) - right side approach  Patient Location: PACU  Anesthesia Type:General  Level of Consciousness: awake, alert  and oriented  Airway & Oxygen Therapy: Patient Spontanous Breathing  Post-op Assessment: Report given to RN and Post -op Vital signs reviewed and stable  Post vital signs: Reviewed and stable  Last Vitals:  Filed Vitals:   01/03/16 0651  BP: 113/54  Pulse: 54  Temp: 36.7 C  Resp: 18    Last Pain: There were no vitals filed for this visit.       Complications: No apparent anesthesia complications

## 2016-01-03 NOTE — Progress Notes (Signed)
Pt not responding, dropping sats, 0.4 of narcan given per CRNA, pt arrousable after

## 2016-01-03 NOTE — Anesthesia Postprocedure Evaluation (Signed)
Anesthesia Post Note  Patient: Kimberly KirschnerDanielle R [name]  Procedure(s) Performed: Procedure(s) (LRB): ANTERIOR CERVICAL DECOMPRESSION FUSION CERVICAL FIVE-SIX,CERVICAL SIX-SEVEN. (N/A)  Patient location during evaluation: PACU Anesthesia Type: General Level of consciousness: awake and alert Pain management: pain level controlled Vital Signs Assessment: post-procedure vital signs reviewed and stable Respiratory status: spontaneous breathing, nonlabored ventilation, respiratory function stable and patient connected to nasal cannula oxygen Cardiovascular status: blood pressure returned to baseline and stable Postop Assessment: no signs of nausea or vomiting Anesthetic complications: no    Last Vitals:  Filed Vitals:   01/03/16 1230 01/03/16 1241  BP: 123/78   Pulse: 59 59  Temp:  36.7 C  Resp: 15 12    Last Pain:  Filed Vitals:   01/03/16 1241  PainSc: Asleep                 Kennieth RadFitzgerald, Mitsuo Budnick E

## 2016-01-03 NOTE — Anesthesia Preprocedure Evaluation (Addendum)
Anesthesia Evaluation  Patient identified by MRN, date of birth, ID band Patient awake    Reviewed: Allergy & Precautions, NPO status , Patient's Chart, lab work & pertinent test results, reviewed documented beta blocker date and time   Airway Mallampati: II  TM Distance: >3 FB Neck ROM: Full    Dental  (+) Dental Advisory Given, Teeth Intact   Pulmonary neg pulmonary ROS,    breath sounds clear to auscultation       Cardiovascular hypertension, Pt. on medications and Pt. on home beta blockers  Rhythm:Regular Rate:Normal     Neuro/Psych  Headaches, Anxiety Depression    GI/Hepatic negative GI ROS, Neg liver ROS,   Endo/Other  diabetes, Poorly Controlled, Type 2, Insulin Dependent, Oral Hypoglycemic AgentsMorbid obesity  Renal/GU negative Renal ROS     Musculoskeletal   Abdominal   Peds  Hematology negative hematology ROS (+)   Anesthesia Other Findings   Reproductive/Obstetrics                           Lab Results  Component Value Date   WBC 7.2 12/30/2015   HGB 15.0 12/30/2015   HCT 47.4* 12/30/2015   MCV 92.2 12/30/2015   PLT 175 12/30/2015   Lab Results  Component Value Date   CREATININE 0.88 12/30/2015   BUN 16 12/30/2015   NA 136 12/30/2015   K 3.8 12/30/2015   CL 109 12/30/2015   CO2 20* 12/30/2015    Anesthesia Physical Anesthesia Plan  ASA: III  Anesthesia Plan: General   Post-op Pain Management:    Induction: Intravenous  Airway Management Planned: Oral ETT  Additional Equipment:   Intra-op Plan:   Post-operative Plan: Extubation in OR  Informed Consent: I have reviewed the patients History and Physical, chart, labs and discussed the procedure including the risks, benefits and alternatives for the proposed anesthesia with the patient or authorized representative who has indicated his/her understanding and acceptance.   Dental advisory given  Plan  Discussed with: CRNA  Anesthesia Plan Comments:         Anesthesia Quick Evaluation

## 2016-01-04 NOTE — Telephone Encounter (Signed)
LMTRC

## 2016-01-05 ENCOUNTER — Encounter (HOSPITAL_COMMUNITY): Payer: Self-pay | Admitting: Neurosurgery

## 2016-02-02 ENCOUNTER — Other Ambulatory Visit: Payer: Self-pay | Admitting: Family Medicine

## 2016-02-02 NOTE — Telephone Encounter (Signed)
Refill appropriate and filled per protocol. 

## 2016-02-06 ENCOUNTER — Other Ambulatory Visit: Payer: Self-pay | Admitting: Family Medicine

## 2016-02-10 ENCOUNTER — Other Ambulatory Visit: Payer: Self-pay | Admitting: Family Medicine

## 2016-02-10 NOTE — Telephone Encounter (Signed)
Ok to refill 

## 2016-02-13 NOTE — Telephone Encounter (Signed)
ok 

## 2016-02-13 NOTE — Telephone Encounter (Signed)
Rx called in 

## 2016-03-14 ENCOUNTER — Telehealth: Payer: Self-pay | Admitting: Family Medicine

## 2016-03-14 NOTE — Telephone Encounter (Signed)
PATIENT IS CALLING TO SPEAK TO YOU REGARDING HER METFORMIN AND THE SIDE EFFECTS

## 2016-03-20 NOTE — Telephone Encounter (Signed)
LMOVM to schedule appt 

## 2016-03-23 ENCOUNTER — Encounter: Payer: Self-pay | Admitting: Family Medicine

## 2016-03-23 ENCOUNTER — Ambulatory Visit (INDEPENDENT_AMBULATORY_CARE_PROVIDER_SITE_OTHER): Payer: Medicaid Other | Admitting: Family Medicine

## 2016-03-23 VITALS — BP 118/76 | HR 74 | Temp 98.2°F | Resp 18 | Ht 61.0 in | Wt 246.0 lb

## 2016-03-23 DIAGNOSIS — E1100 Type 2 diabetes mellitus with hyperosmolarity without nonketotic hyperglycemic-hyperosmolar coma (NKHHC): Secondary | ICD-10-CM

## 2016-03-23 DIAGNOSIS — I10 Essential (primary) hypertension: Secondary | ICD-10-CM

## 2016-03-23 DIAGNOSIS — N760 Acute vaginitis: Secondary | ICD-10-CM | POA: Diagnosis not present

## 2016-03-23 LAB — URINALYSIS, ROUTINE W REFLEX MICROSCOPIC
Bilirubin Urine: NEGATIVE
Hgb urine dipstick: NEGATIVE
Ketones, ur: NEGATIVE
Leukocytes, UA: NEGATIVE
Nitrite: NEGATIVE
Protein, ur: NEGATIVE
Specific Gravity, Urine: 1.02 (ref 1.001–1.035)
pH: 5.5 (ref 5.0–8.0)

## 2016-03-23 MED ORDER — GLIPIZIDE ER 10 MG PO TB24: 10.0000 mg | ORAL_TABLET | Freq: Every day | ORAL | 5 refills | Status: DC

## 2016-03-23 MED ORDER — METRONIDAZOLE 500 MG PO TABS: 500.0000 mg | ORAL_TABLET | Freq: Three times a day (TID) | ORAL | 0 refills | Status: DC

## 2016-03-23 MED ORDER — INSULIN GLARGINE 100 UNIT/ML ~~LOC~~ SOLN: 18.0000 [IU] | Freq: Every day | SUBCUTANEOUS | 3 refills | Status: DC

## 2016-03-23 NOTE — Progress Notes (Signed)
Subjective:    Patient ID: Kimberly [name], female    DOB: [birth year], 45 y.o.   MRN: [id number]  HPI 12/16/15 Approximately 6 weeks ago, sugars became controlled. The highest blood sugar the patient saw was  approximately 140. She was on a combination of metformin and jardiance.  However she was started on prednisone for a pinched nerve in her neck. After that point her sugars have spiraled out of control. There are now 400-600. She reports polyuria, polydipsia, and blurred vision. Today her sugars are 400. She denies any chest pain although she does report dry mouth. She is still taking metformin and jardiance.  At that time, my plan was: Sugars are uncontrolled and I believe we need to start insulin to gain some sense of control. Add glargine 7 units daily today. The patient will check her fasting blood sugar every morning and increase by 1 unit every day until fasting blood sugars are under 130. She will call me back next week and notify me of the sugar value so we can make gross adjustments. I anticipate that she may require anywhere from 30-60 units total of glargine. We will see how she responds to low-dose insulin over the weekend.  03/23/16 Wt Readings from Last 3 Encounters:  03/23/16 246 lb (111.6 kg)  01/03/16 246 lb (111.6 kg)  12/30/15 246 lb 12.8 oz (111.9 kg)  Patient is currently taking metformin. She discontinued first jardiance and then invokana due to recurrent yeast infections. She is on Lantus 18 units daily. Her fasting blood sugars are greater than 300. She is not even checking her 2 hour postprandial sugars. She reports polyuria and polydipsia. She is also complaining of brown foul-smelling vaginal discharge even after taking 2 different rounds of Diflucan. She is not having sex. She has a history of a hysterectomy and therefore this cannot be endometrial spotting. Sounds like it's bacterial vaginosis.   Past Medical History:  Diagnosis Date  . Abnormal vaginal Pap smear     tx with cryotherapy  . Anxiety   . Depression   . Diabetes mellitus without complication (HCC)   . History of kidney stones    passed stone - no surgery  . Hypertension   . Migraine    otc med prn  . Obesity   . Pseudotumor cerebri   . SVD (spontaneous vaginal delivery)    x 3  . Tension headache    Past Surgical History:  Procedure Laterality Date  . ABDOMINAL HYSTERECTOMY    . ANTERIOR CERVICAL DECOMP/DISCECTOMY FUSION N/A 01/03/2016   Procedure: ANTERIOR CERVICAL DECOMPRESSION FUSION CERVICAL FIVE-SIX,CERVICAL SIX-SEVEN.;  Surgeon: Lisbeth Renshaw, MD;  Location: [facility];  Service: Neurosurgery;  Laterality: N/A;  right side approach  . CRYOTHERAPY    . LAPAROSCOPIC ASSISTED VAGINAL HYSTERECTOMY Bilateral 04/21/2014   Procedure: LAPAROSCOPIC ASSISTED VAGINAL HYSTERECTOMY, BILATERAL SALPINGECTOMY;  Surgeon: Lavina Hamman, MD;  Location: [facility];  Service: Gynecology;  Laterality: Bilateral;  . MANDIBLE FRACTURE SURGERY  2002   x 1  . TUBAL LIGATION  11/2003  . WISDOM TOOTH EXTRACTION     Current Outpatient Prescriptions on File Prior to Visit  Medication Sig Dispense Refill  . acetaZOLAMIDE (DIAMOX) 250 MG tablet Take 250 mg by mouth 2 (two) times daily.    Marland Kitchen acetaZOLAMIDE (DIAMOX) 250 MG tablet TAKE 1 TABLET (250 MG TOTAL) BY MOUTH 2 (TWO) TIMES DAILY. 60 tablet 3  . ALPRAZolam (XANAX) 1 MG tablet TAKE 1 TABLET BY MOUTH 3 TIMES A DAY  AS NEEDED 90 tablet 2  . atenolol (TENORMIN) 100 MG tablet Take 100 mg by mouth at bedtime.     Marland Kitchen buPROPion (WELLBUTRIN SR) 150 MG 12 hr tablet Take 150 mg by mouth at bedtime.     Marland Kitchen buPROPion (WELLBUTRIN SR) 150 MG 12 hr tablet TAKE 1 TABLET (150 MG TOTAL) BY MOUTH DAILY. THIS IS ONCE DAILY. 30 tablet 3  . canagliflozin (INVOKANA) 100 MG TABS tablet Take 1 tablet (100 mg total) by mouth daily before breakfast. 30 tablet 3  . escitalopram (LEXAPRO) 10 MG tablet Take 10 mg by mouth at bedtime.     . fluconazole (DIFLUCAN) 150 MG tablet  TAKE 1 TABLET BY MOUTH ONCE IF SYMPTOMS DO NOT RESOLVE OFFICE VISIT IS REQUIRED 1 tablet 0  . HYDROcodone-acetaminophen (NORCO/VICODIN) 5-325 MG tablet Take 1 tablet by mouth every 4 (four) hours as needed for moderate pain.    Marland Kitchen insulin glargine (LANTUS) 100 UNIT/ML injection Inject 7 Units into the skin at bedtime. Start at 7 units, patient will check blood sugar every morning,  increase 1 unit every day until fasting is under 130    . metFORMIN (GLUCOPHAGE) 1000 MG tablet Take 1,000 mg by mouth 2 (two) times daily with a meal.    . oxyCODONE-acetaminophen (PERCOCET) 10-325 MG tablet Take 1 tablet by mouth every 4 (four) hours as needed for pain. 30 tablet 0   No current facility-administered medications on file prior to visit.    No Known Allergies Social History   Social History  . Marital status: Divorced    Spouse name: N/A  . Number of children: N/A  . Years of education: N/A   Occupational History  . Not on file.   Social History Main Topics  . Smoking status: Never Smoker  . Smokeless tobacco: Never Used  . Alcohol use 0.6 oz/week    1 Glasses of wine per week     Comment: one glass of wine per week  . Drug use: No  . Sexual activity: Yes    Birth control/ protection: Surgical   Other Topics Concern  . Not on file   Social History Narrative  . No narrative on file     Review of Systems  All other systems reviewed and are negative.      Objective:   Physical Exam  Constitutional: She appears well-developed and well-nourished. No distress.  Neck: Neck supple.  Cardiovascular: Normal rate and regular rhythm.   Pulmonary/Chest: Effort normal and breath sounds normal.  Abdominal: Soft. Bowel sounds are normal.  Skin: She is not diaphoretic.  Vitals reviewed.         Assessment & Plan:  Uncontrolled type 2 diabetes mellitus with hyperosmolarity without coma, without long-term current use of insulin (HCC) - Plan: CBC with Differential/Platelet, COMPLETE  METABOLIC PANEL WITH GFR, Hemoglobin A1c, Microalbumin, urine, glipiZIDE (GLUCOTROL XL) 10 MG 24 hr tablet  Essential hypertension  Vaginitis and vulvovaginitis - Plan: metroNIDAZOLE (FLAGYL) 500 MG tablet, Urinalysis, Routine w reflex microscopic (not at [facility])  Add glipizide extended release 10 mg a day for both cost and because the patient is not checking her sugars often enough to adequately titrate her insulin. Check hemoglobin A1c. Recheck fasting blood sugars and two-hour postprandial sugars at an office visit in 2 weeks. Blood pressure is acceptable. I will treat the patient with Flagyl 500 mg at twice daily for 7 days for BV. Recheck in 2 weeks to see if symptoms have improved. In 2 weeks  if sugars remain elevated, we may want to try Victoza to assist with weight loss and possibly discontinue metformin as this medication is giving the patient's severe diarrhea

## 2016-03-24 LAB — COMPLETE METABOLIC PANEL WITH GFR
ALT: 55 U/L — ABNORMAL HIGH (ref 6–29)
AST: 25 U/L (ref 10–35)
Albumin: 3.9 g/dL (ref 3.6–5.1)
Alkaline Phosphatase: 137 U/L — ABNORMAL HIGH (ref 33–115)
BUN: 16 mg/dL (ref 7–25)
CO2: 23 mmol/L (ref 20–31)
Calcium: 9.4 mg/dL (ref 8.6–10.2)
Chloride: 105 mmol/L (ref 98–110)
Creat: 0.91 mg/dL (ref 0.50–1.10)
GFR, Est African American: 88 mL/min (ref >=60)
GFR, Est Non African American: 76 mL/min (ref >=60)
Glucose, Bld: 361 mg/dL — ABNORMAL HIGH (ref 70–99)
Potassium: 4.2 mmol/L (ref 3.5–5.3)
Sodium: 138 mmol/L (ref 135–146)
Total Bilirubin: 0.6 mg/dL (ref 0.2–1.2)
Total Protein: 6.5 g/dL (ref 6.1–8.1)

## 2016-03-24 LAB — CBC WITH DIFFERENTIAL/PLATELET
Basophils Absolute: 0 cells/uL (ref 0–200)
Basophils Relative: 0 %
Eosinophils Absolute: 76 cells/uL (ref 15–500)
Eosinophils Relative: 1 %
HCT: 43.1 % (ref 35.0–45.0)
Hemoglobin: 13.9 g/dL (ref 12.0–15.0)
Lymphocytes Relative: 26 %
Lymphs Abs: 1976 cells/uL (ref 850–3900)
MCH: 28.1 pg (ref 27.0–33.0)
MCHC: 32.3 g/dL (ref 32.0–36.0)
MCV: 87.2 fL (ref 80.0–100.0)
MPV: 11.7 fL (ref 7.5–12.5)
Monocytes Absolute: 456 cells/uL (ref 200–950)
Monocytes Relative: 6 %
Neutro Abs: 5092 cells/uL (ref 1500–7800)
Neutrophils Relative %: 67 %
Platelets: 178 10*3/uL (ref 140–400)
RBC: 4.94 MIL/uL (ref 3.80–5.10)
RDW: 13.1 % (ref 11.0–15.0)
WBC: 7.6 10*3/uL (ref 3.8–10.8)

## 2016-03-24 LAB — HEMOGLOBIN A1C
Hgb A1c MFr Bld: 9.5 % — ABNORMAL HIGH (ref <5.7)
Mean Plasma Glucose: 226 mg/dL

## 2016-03-24 LAB — MICROALBUMIN, URINE: Microalb, Ur: 0.3 mg/dL

## 2016-04-06 ENCOUNTER — Encounter: Payer: Self-pay | Admitting: Family Medicine

## 2016-04-06 ENCOUNTER — Ambulatory Visit (INDEPENDENT_AMBULATORY_CARE_PROVIDER_SITE_OTHER): Payer: Medicaid Other | Admitting: Family Medicine

## 2016-04-06 VITALS — BP 144/100 | HR 88 | Temp 97.9°F | Resp 18 | Ht 61.0 in | Wt 250.0 lb

## 2016-04-06 DIAGNOSIS — E1100 Type 2 diabetes mellitus with hyperosmolarity without nonketotic hyperglycemic-hyperosmolar coma (NKHHC): Secondary | ICD-10-CM | POA: Diagnosis not present

## 2016-04-06 DIAGNOSIS — Z23 Encounter for immunization: Secondary | ICD-10-CM

## 2016-04-06 MED ORDER — LIRAGLUTIDE 18 MG/3ML ~~LOC~~ SOPN: PEN_INJECTOR | SUBCUTANEOUS | 5 refills | Status: DC

## 2016-04-06 NOTE — Addendum Note (Signed)
Addended by: Legrand RamsWILLIS, SANDY B on: 04/06/2016 12:48 PM   Modules accepted: Orders

## 2016-04-06 NOTE — Progress Notes (Signed)
Subjective:    Patient ID: Kimberly [name], female    DOB: December 21, 1970, 45 y.o.   MRN: [id number]  HPI 12/16/15 Approximately 6 weeks ago, sugars became controlled. The highest blood sugar the patient saw was  approximately 140. She was on a combination of metformin and jardiance.  However she was started on prednisone for a pinched nerve in her neck. After that point her sugars have spiraled out of control. There are now 400-600. She reports polyuria, polydipsia, and blurred vision. Today her sugars are 400. She denies any chest pain although she does report dry mouth. She is still taking metformin and jardiance.  At that time, my plan was: Sugars are uncontrolled and I believe we need to start insulin to gain some sense of control. Add glargine 7 units daily today. The patient will check her fasting blood sugar every morning and increase by 1 unit every day until fasting blood sugars are under 130. She will call me back next week and notify me of the sugar value so we can make gross adjustments. I anticipate that she may require anywhere from 30-60 units total of glargine. We will see how she responds to low-dose insulin over the weekend.  03/23/16 Wt Readings from Last 3 Encounters:  04/06/16 250 lb (113.4 kg)  03/23/16 246 lb (111.6 kg)  01/03/16 246 lb (111.6 kg)  Patient is currently taking metformin. She discontinued first jardiance and then invokana due to recurrent yeast infections. She is on Lantus 18 units daily. Her fasting blood sugars are greater than 300. She is not even checking her 2 hour postprandial sugars. She reports polyuria and polydipsia. She is also complaining of brown foul-smelling vaginal discharge even after taking 2 different rounds of Diflucan. She is not having sex. She has a history of a hysterectomy and therefore this cannot be endometrial spotting. Sounds like it's bacterial vaginosis.  04/06/16 Fasting blood sugars are typically 200 in the morning. She is only  seen one sugar lower than 200 and now was 150 last evening. However she is unable to tolerate metformin due to severe diarrhea. She is also on insulin 18 units a day as well as the glipizide  Past Medical History:  Diagnosis Date  . Abnormal vaginal Pap smear    tx with cryotherapy  . Anxiety   . Depression   . Diabetes mellitus without complication (HCC)   . History of kidney stones    passed stone - no surgery  . Hypertension   . Migraine    otc med prn  . Obesity   . Pseudotumor cerebri   . SVD (spontaneous vaginal delivery)    x 3  . Tension headache    Past Surgical History:  Procedure Laterality Date  . ABDOMINAL HYSTERECTOMY    . ANTERIOR CERVICAL DECOMP/DISCECTOMY FUSION N/A 01/03/2016   Procedure: ANTERIOR CERVICAL DECOMPRESSION FUSION CERVICAL FIVE-SIX,CERVICAL SIX-SEVEN.;  Surgeon: Lisbeth Renshaw, MD;  Location: [facility];  Service: Neurosurgery;  Laterality: N/A;  right side approach  . CRYOTHERAPY    . LAPAROSCOPIC ASSISTED VAGINAL HYSTERECTOMY Bilateral 04/21/2014   Procedure: LAPAROSCOPIC ASSISTED VAGINAL HYSTERECTOMY, BILATERAL SALPINGECTOMY;  Surgeon: Lavina Hamman, MD;  Location: [facility];  Service: Gynecology;  Laterality: Bilateral;  . MANDIBLE FRACTURE SURGERY  2002   x 1  . TUBAL LIGATION  11/2003  . WISDOM TOOTH EXTRACTION     Current Outpatient Prescriptions on File Prior to Visit  Medication Sig Dispense Refill  . acetaZOLAMIDE (DIAMOX) 250 MG tablet Take  250 mg by mouth 2 (two) times daily.    Marland Kitchen ALPRAZolam (XANAX) 1 MG tablet TAKE 1 TABLET BY MOUTH 3 TIMES A DAY AS NEEDED 90 tablet 2  . atenolol (TENORMIN) 100 MG tablet Take 100 mg by mouth at bedtime.     Marland Kitchen atorvastatin (LIPITOR) 40 MG tablet TAKE 1 TABLET (40 MG TOTAL) BY MOUTH DAILY.  3  . buPROPion (WELLBUTRIN SR) 150 MG 12 hr tablet Take 150 mg by mouth at bedtime.     Marland Kitchen escitalopram (LEXAPRO) 10 MG tablet Take 10 mg by mouth at bedtime.     Marland Kitchen glipiZIDE (GLUCOTROL XL) 10 MG 24 hr tablet Take  1 tablet (10 mg total) by mouth daily with breakfast. 30 tablet 5  . insulin glargine (LANTUS) 100 UNIT/ML injection Inject 0.18 mLs (18 Units total) into the skin at bedtime. Start at 7 units, patient will check blood sugar every morning,  increase 1 unit every day until fasting is under 130 10 mL 3  . metFORMIN (GLUCOPHAGE) 1000 MG tablet Take 1,000 mg by mouth 2 (two) times daily with a meal.     No current facility-administered medications on file prior to visit.    No Known Allergies Social History   Social History  . Marital status: Divorced    Spouse name: N/A  . Number of children: N/A  . Years of education: N/A   Occupational History  . Not on file.   Social History Main Topics  . Smoking status: Never Smoker  . Smokeless tobacco: Never Used  . Alcohol use 0.6 oz/week    1 Glasses of wine per week     Comment: one glass of wine per week  . Drug use: No  . Sexual activity: Yes    Birth control/ protection: Surgical   Other Topics Concern  . Not on file   Social History Narrative  . No narrative on file     Review of Systems  All other systems reviewed and are negative.      Objective:   Physical Exam  Constitutional: She appears well-developed and well-nourished. No distress.  Neck: Neck supple.  Cardiovascular: Normal rate and regular rhythm.   Pulmonary/Chest: Effort normal and breath sounds normal.  Abdominal: Soft. Bowel sounds are normal.  Skin: She is not diaphoretic.  Vitals reviewed.         Assessment & Plan:  Uncontrolled type 2 diabetes mellitus with hyperosmolarity without coma, without long-term current use of insulin (HCC)  Discontinue metformin due to diarrhea. Replace with Victoza 1.2 mg subcutaneous daily. Continue insulin 18 units a day as well as glipizide. Recheck fasting blood sugars and two-hour postprandial sugars in 3-4 weeks. If still elevated at that time, I would begin increasing her basal insulin by 1 unit every day until  fasting blood sugars fall below 130.

## 2016-04-06 NOTE — Addendum Note (Signed)
Addended by: Legrand RamsWILLIS, Makya Phillis B on: 04/06/2016 09:43 AM   Modules accepted: Orders

## 2016-04-10 ENCOUNTER — Other Ambulatory Visit: Payer: Self-pay | Admitting: Family Medicine

## 2016-04-10 ENCOUNTER — Telehealth: Payer: Self-pay | Admitting: *Deleted

## 2016-04-10 MED ORDER — INSULIN PEN NEEDLE 31G X 8 MM MISC: 3 refills | Status: DC

## 2016-04-10 MED ORDER — INSULIN GLARGINE 100 UNIT/ML SOLOSTAR PEN: PEN_INJECTOR | SUBCUTANEOUS | 5 refills | Status: DC

## 2016-04-10 NOTE — Telephone Encounter (Signed)
Received request from pharmacy for PA on Victoza.   PA submitted. [id number].  Dx: E11.65- uncontrolled DM.  Preferred alternatives include: Byetta Bydureon Trulicity  Studies have shown that Victoza is more effective than other GLP -1 Receptor Antagonists in A1C reduction.

## 2016-04-12 MED ORDER — DULAGLUTIDE 1.5 MG/0.5ML ~~LOC~~ SOAJ: 1.5000 mg | SUBCUTANEOUS | 11 refills | Status: DC

## 2016-04-12 NOTE — Telephone Encounter (Signed)
Patient has tried invokana, metformin, jardiance and lantus and sugars are still uncontrolled?

## 2016-04-12 NOTE — Telephone Encounter (Signed)
Prescription sent to pharmacy. .   Call placed to patient and patient made aware.  

## 2016-04-12 NOTE — Telephone Encounter (Signed)
Preferred alternatives include: Byetta Bydureon Trulicity

## 2016-04-12 NOTE — Telephone Encounter (Signed)
Ok, trulicity 1.5 mg q week.

## 2016-04-12 NOTE — Telephone Encounter (Signed)
Received call from pharmacy.   trulicity has been removed from formulary.   Alternatives include Bydureon and Byetta.   MD please advise.

## 2016-04-12 NOTE — Telephone Encounter (Signed)
Received PA determination.   PA denied.   Patient must try and fail (2) preferred medications.

## 2016-04-13 MED ORDER — EXENATIDE ER 2 MG ~~LOC~~ PEN: 2.0000 mg | PEN_INJECTOR | SUBCUTANEOUS | 11 refills | Status: DC

## 2016-04-13 NOTE — Addendum Note (Signed)
Addended by: Phillips OdorSIX, Bodie Abernethy H on: 04/13/2016 08:14 AM   Modules accepted: Orders

## 2016-04-13 NOTE — Telephone Encounter (Signed)
Prescription sent to pharmacy.

## 2016-04-13 NOTE — Telephone Encounter (Signed)
Switch to Bydureon 2 mg sq once weekly.  Can come by for samples and to be shown how to use the device.

## 2016-05-10 ENCOUNTER — Other Ambulatory Visit: Payer: Self-pay | Admitting: Family Medicine

## 2016-05-10 NOTE — Telephone Encounter (Signed)
Ok to refill??  Last office visit 04/06/2016.  Last refill 02/13/2016, #2 refills.

## 2016-05-11 NOTE — Telephone Encounter (Signed)
okay

## 2016-05-30 ENCOUNTER — Other Ambulatory Visit: Payer: Self-pay | Admitting: Family Medicine

## 2016-06-14 ENCOUNTER — Other Ambulatory Visit: Payer: Self-pay | Admitting: Family Medicine

## 2016-07-27 ENCOUNTER — Ambulatory Visit (INDEPENDENT_AMBULATORY_CARE_PROVIDER_SITE_OTHER): Payer: Medicaid Other | Admitting: Family Medicine

## 2016-07-27 ENCOUNTER — Encounter: Payer: Self-pay | Admitting: Family Medicine

## 2016-07-27 VITALS — BP 136/80 | HR 76 | Temp 98.6°F | Resp 16 | Ht 61.0 in | Wt 261.0 lb

## 2016-07-27 DIAGNOSIS — E1100 Type 2 diabetes mellitus with hyperosmolarity without nonketotic hyperglycemic-hyperosmolar coma (NKHHC): Secondary | ICD-10-CM | POA: Diagnosis not present

## 2016-07-27 DIAGNOSIS — I10 Essential (primary) hypertension: Secondary | ICD-10-CM | POA: Diagnosis not present

## 2016-07-27 NOTE — Progress Notes (Signed)
Subjective:    Patient ID: Kimberly [name], female    DOB: 07-17-1970, 46 y.o.   MRN: [id number]  Diabetes   Medication Refill    12/16/15 Approximately 6 weeks ago, sugars became controlled. The highest blood sugar the patient saw was  approximately 140. She was on a combination of metformin and jardiance.  However she was started on prednisone for a pinched nerve in her neck. After that point her sugars have spiraled out of control. There are now 400-600. She reports polyuria, polydipsia, and blurred vision. Today her sugars are 400. She denies any chest pain although she does report dry mouth. She is still taking metformin and jardiance.  At that time, my plan was: Sugars are uncontrolled and I believe we need to start insulin to gain some sense of control. Add glargine 7 units daily today. The patient will check her fasting blood sugar every morning and increase by 1 unit every day until fasting blood sugars are under 130. She will call me back next week and notify me of the sugar value so we can make gross adjustments. I anticipate that she may require anywhere from 30-60 units total of glargine. We will see how she responds to low-dose insulin over the weekend.  03/23/16 Wt Readings from Last 3 Encounters:  07/27/16 261 lb (118.4 kg)  04/06/16 250 lb (113.4 kg)  03/23/16 246 lb (111.6 kg)  Patient is currently taking metformin. She discontinued first jardiance and then invokana due to recurrent yeast infections. She is on Lantus 18 units daily. Her fasting blood sugars are greater than 300. She is not even checking her 2 hour postprandial sugars. She reports polyuria and polydipsia. She is also complaining of brown foul-smelling vaginal discharge even after taking 2 different rounds of Diflucan. She is not having sex. She has a history of a hysterectomy and therefore this cannot be endometrial spotting. Sounds like it's bacterial vaginosis.  04/06/16 Fasting blood sugars are typically  200 in the morning. She is only seen one sugar lower than 200 and now was 150 last evening. However she is unable to tolerate metformin due to severe diarrhea. She is also on insulin 18 units a day as well as the glipizide.  At that time, my plan was: Discontinue metformin due to diarrhea. Replace with Victoza 1.2 mg subcutaneous daily. Continue insulin 18 units a day as well as glipizide. Recheck fasting blood sugars and two-hour postprandial sugars in 3-4 weeks. If still elevated at that time, I would begin increasing her basal insulin by 1 unit every day until fasting blood sugars fall below 130.  07/27/16 The patient originally scheduled the appointment today because she was concerned about a mole on her neck. Under closer inspection today it is a 3 mm brown papule that appears to be a [place] keratoses. He has nice well demarcated borders, a light brown color a warty characteristics that is reassuring. However I am concerned about her blood sugars. We have not followed up as originally planned. She states that her sugars can frequently be 240-250. 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 She states that she cannot even feel her left foot. I spent 10 minutes today fussing at the patient. She must take better care of herself. To allow her sugars run in the mid 200s on a consistent basis is only going cause kidney problems possibly strokes possibly blindness in the future. I explained this to the patient in no uncertain terms. I will check a CMP today  along with a hemoglobin A1c. Goal hemoglobin A1c is less than 7. We will need to titrate insulin if her sugars are truly as bad as she says. Past Medical History:  Diagnosis Date  . Abnormal vaginal Pap smear    tx with cryotherapy  . Anxiety   . Depression   . Diabetes mellitus without complication (HCC)   . History of kidney stones    passed stone - no surgery  . Hypertension   . Migraine    otc med prn  . Obesity   . Pseudotumor cerebri   . SVD  (spontaneous vaginal delivery)    x 3  . Tension headache    Past Surgical History:  Procedure Laterality Date  . ABDOMINAL HYSTERECTOMY    . ANTERIOR CERVICAL DECOMP/DISCECTOMY FUSION N/A 01/03/2016   Procedure: ANTERIOR CERVICAL DECOMPRESSION FUSION CERVICAL FIVE-SIX,CERVICAL SIX-SEVEN.;  Surgeon: Lisbeth Renshaw, MD;  Location: [facility];  Service: Neurosurgery;  Laterality: N/A;  right side approach  . CRYOTHERAPY    . LAPAROSCOPIC ASSISTED VAGINAL HYSTERECTOMY Bilateral 04/21/2014   Procedure: LAPAROSCOPIC ASSISTED VAGINAL HYSTERECTOMY, BILATERAL SALPINGECTOMY;  Surgeon: Lavina Hamman, MD;  Location: [facility];  Service: Gynecology;  Laterality: Bilateral;  . MANDIBLE FRACTURE SURGERY  2002   x 1  . TUBAL LIGATION  11/2003  . WISDOM TOOTH EXTRACTION     Current Outpatient Prescriptions on File Prior to Visit  Medication Sig Dispense Refill  . acetaZOLAMIDE (DIAMOX) 250 MG tablet TAKE 1 TABLET (250 MG TOTAL) BY MOUTH 2 (TWO) TIMES DAILY. 60 tablet 3  . ALPRAZolam (XANAX) 1 MG tablet TAKE 1 TABLET BY MOUTH 3 TIMES DAILY AS NEEDED 90 tablet 2  . atenolol (TENORMIN) 100 MG tablet Take 100 mg by mouth at bedtime.     Marland Kitchen atorvastatin (LIPITOR) 40 MG tablet TAKE 1 TABLET (40 MG TOTAL) BY MOUTH DAILY.  3  . buPROPion (WELLBUTRIN SR) 150 MG 12 hr tablet Take 150 mg by mouth at bedtime.     Marland Kitchen escitalopram (LEXAPRO) 10 MG tablet Take 10 mg by mouth at bedtime.     . Exenatide ER 2 MG PEN Inject 2 mg into the skin once a week. 4 each 11  . glipiZIDE (GLUCOTROL XL) 10 MG 24 hr tablet Take 1 tablet (10 mg total) by mouth daily with breakfast. 30 tablet 5  . Insulin Glargine (LANTUS SOLOSTAR) 100 UNIT/ML Solostar Pen Inject 0.18 mLs (18 Units total) into the skin at bedtime increase 1 unit every day until fasting is under 130 5 pen 5  . Insulin Pen Needle 31G X 8 MM MISC Use with pen QD 100 each 3   No current facility-administered medications on file prior to visit.    No Known  Allergies Social History   Social History  . Marital status: Divorced    Spouse name: N/A  . Number of children: N/A  . Years of education: N/A   Occupational History  . Not on file.   Social History Main Topics  . Smoking status: Never Smoker  . Smokeless tobacco: Never Used  . Alcohol use 0.6 oz/week    1 Glasses of wine per week     Comment: one glass of wine per week  . Drug use: No  . Sexual activity: Yes    Birth control/ protection: Surgical   Other Topics Concern  . Not on file   Social History Narrative  . No narrative on file     Review of Systems  All other  systems reviewed and are negative.      Objective:   Physical Exam  Constitutional: She appears well-developed and well-nourished. No distress.  Neck: Neck supple.  Cardiovascular: Normal rate and regular rhythm.   Pulmonary/Chest: Effort normal and breath sounds normal.  Abdominal: Soft. Bowel sounds are normal.  Skin: She is not diaphoretic.  Vitals reviewed.         Assessment & Plan:  Uncontrolled type 2 diabetes mellitus with hyperosmolarity without coma, without long-term current use of insulin (HCC) - Plan: COMPLETE METABOLIC PANEL WITH GFR, Hemoglobin A1c, LDL Cholesterol, Direct  Essential hypertension  Blood pressure today is well controlled. Blood sugar is uncontrolled. Check hemoglobin A1c. Goal hemoglobin A1c is less than 7. From the reports the patient is telling me, it does not appear the glipizide, bydureon, and lantus are effective.  We'll need to uptitrate Lantus until fasting blood sugars are under 130. I will check a direct LDL cholesterol. Goal LDL cholesterol is less than 100. I reassured the patient that the papule on her neck appears benign. I do not feel that this needs evaluation in a dermatologist office.

## 2016-07-28 LAB — COMPLETE METABOLIC PANEL WITH GFR
ALT: 42 U/L — ABNORMAL HIGH (ref 6–29)
AST: 20 U/L (ref 10–35)
Albumin: 4.1 g/dL (ref 3.6–5.1)
Alkaline Phosphatase: 106 U/L (ref 33–115)
BUN: 16 mg/dL (ref 7–25)
CO2: 28 mmol/L (ref 20–31)
Calcium: 8.9 mg/dL (ref 8.6–10.2)
Chloride: 106 mmol/L (ref 98–110)
Creat: 0.97 mg/dL (ref 0.50–1.10)
GFR, Est African American: 82 mL/min (ref >=60)
GFR, Est Non African American: 71 mL/min (ref >=60)
Glucose, Bld: 275 mg/dL — ABNORMAL HIGH (ref 70–99)
Potassium: 3.9 mmol/L (ref 3.5–5.3)
Sodium: 140 mmol/L (ref 135–146)
Total Bilirubin: 0.5 mg/dL (ref 0.2–1.2)
Total Protein: 6.6 g/dL (ref 6.1–8.1)

## 2016-07-28 LAB — HEMOGLOBIN A1C
Hgb A1c MFr Bld: 6.8 % — ABNORMAL HIGH (ref <5.7)
Mean Plasma Glucose: 148 mg/dL

## 2016-07-28 LAB — LDL CHOLESTEROL, DIRECT: Direct LDL: 122 mg/dL (ref <130)

## 2016-07-30 ENCOUNTER — Other Ambulatory Visit: Payer: Self-pay | Admitting: Family Medicine

## 2016-07-30 MED ORDER — ATORVASTATIN CALCIUM 80 MG PO TABS: 80.0000 mg | ORAL_TABLET | Freq: Every day | ORAL | 3 refills | Status: DC

## 2016-08-03 ENCOUNTER — Other Ambulatory Visit: Payer: Self-pay | Admitting: Family Medicine

## 2016-08-03 NOTE — Telephone Encounter (Signed)
ok 

## 2016-08-03 NOTE — Telephone Encounter (Signed)
Medication called to pharmacy. 

## 2016-08-03 NOTE — Telephone Encounter (Signed)
Ok to refill 

## 2016-10-10 ENCOUNTER — Telehealth: Payer: Self-pay | Admitting: Family Medicine

## 2016-10-10 MED ORDER — FLUCONAZOLE 150 MG PO TABS: ORAL_TABLET | ORAL | 0 refills | Status: DC

## 2016-10-10 NOTE — Telephone Encounter (Signed)
Medication called/sent to requested pharmacy  

## 2016-10-10 NOTE — Telephone Encounter (Signed)
Pt called [facility] stating she has another yeast infection and would like Diflucan called in if possible?   CB# [phone]

## 2016-10-10 NOTE — Telephone Encounter (Signed)
Okay to call in diflucan, schedule OV if it does not clear

## 2016-10-17 ENCOUNTER — Other Ambulatory Visit: Payer: Self-pay | Admitting: Family Medicine

## 2016-10-17 DIAGNOSIS — E1100 Type 2 diabetes mellitus with hyperosmolarity without nonketotic hyperglycemic-hyperosmolar coma (NKHHC): Secondary | ICD-10-CM

## 2016-10-17 MED ORDER — FLUCONAZOLE 150 MG PO TABS: ORAL_TABLET | ORAL | 0 refills | Status: DC

## 2016-10-18 ENCOUNTER — Telehealth: Payer: Self-pay | Admitting: *Deleted

## 2016-10-18 NOTE — Telephone Encounter (Signed)
Medication called to pharmacy. 

## 2016-10-18 NOTE — Telephone Encounter (Signed)
Ok to refill??      Xanax 

## 2016-10-18 NOTE — Telephone Encounter (Signed)
Received request from pharmacy for PA on Bydureon.   Patient has failed Tx with MTF.   PA submitted.   Dx: E11.65.  Confirmation # [id number]

## 2016-10-18 NOTE — Telephone Encounter (Signed)
ok 

## 2016-10-23 MED ORDER — EXENATIDE ER 2 MG ~~LOC~~ PEN: 2.0000 mg | PEN_INJECTOR | SUBCUTANEOUS | 11 refills | Status: DC

## 2016-10-23 NOTE — Telephone Encounter (Signed)
Received PA determination.   PA approved 10/18/2016- 10/18/2017.  PA Case: [id number].   Pharmacy made aware.

## 2016-10-26 ENCOUNTER — Ambulatory Visit: Payer: Medicaid Other | Admitting: Family Medicine

## 2016-11-08 ENCOUNTER — Ambulatory Visit: Payer: Medicaid Other | Admitting: Family Medicine

## 2017-01-10 ENCOUNTER — Other Ambulatory Visit: Payer: Self-pay | Admitting: Family Medicine

## 2017-01-10 ENCOUNTER — Encounter: Payer: Self-pay | Admitting: Family Medicine

## 2017-01-10 ENCOUNTER — Ambulatory Visit (INDEPENDENT_AMBULATORY_CARE_PROVIDER_SITE_OTHER): Payer: Medicaid Other | Admitting: Family Medicine

## 2017-01-10 VITALS — BP 136/82 | HR 82 | Temp 98.4°F | Resp 18 | Ht 61.0 in | Wt 262.0 lb

## 2017-01-10 DIAGNOSIS — IMO0001 Reserved for inherently not codable concepts without codable children: Secondary | ICD-10-CM

## 2017-01-10 DIAGNOSIS — G4710 Hypersomnia, unspecified: Secondary | ICD-10-CM | POA: Diagnosis not present

## 2017-01-10 DIAGNOSIS — I10 Essential (primary) hypertension: Secondary | ICD-10-CM

## 2017-01-10 DIAGNOSIS — Z794 Long term (current) use of insulin: Secondary | ICD-10-CM

## 2017-01-10 DIAGNOSIS — E1165 Type 2 diabetes mellitus with hyperglycemia: Secondary | ICD-10-CM | POA: Diagnosis not present

## 2017-01-10 DIAGNOSIS — Z6841 Body Mass Index (BMI) 40.0 and over, adult: Secondary | ICD-10-CM | POA: Diagnosis not present

## 2017-01-10 NOTE — Progress Notes (Signed)
Subjective:    Patient ID: Kimberly [name], female    DOB: October 01, 1970, 46 y.o.   MRN: [id number]  Medication Refill   Diabetes    12/16/15 Approximately 6 weeks ago, sugars became controlled. The highest blood sugar the patient saw was  approximately 140. She was on a combination of metformin and jardiance.  However she was started on prednisone for a pinched nerve in her neck. After that point her sugars have spiraled out of control. There are now 400-600. She reports polyuria, polydipsia, and blurred vision. Today her sugars are 400. She denies any chest pain although she does report dry mouth. She is still taking metformin and jardiance.  At that time, my plan was: Sugars are uncontrolled and I believe we need to start insulin to gain some sense of control. Add glargine 7 units daily today. The patient will check her fasting blood sugar every morning and increase by 1 unit every day until fasting blood sugars are under 130. She will call me back next week and notify me of the sugar value so we can make gross adjustments. I anticipate that she may require anywhere from 30-60 units total of glargine. We will see how she responds to low-dose insulin over the weekend.  03/23/16 Wt Readings from Last 3 Encounters:  01/10/17 262 lb (118.8 kg)  07/27/16 261 lb (118.4 kg)  04/06/16 250 lb (113.4 kg)  Patient is currently taking metformin. She discontinued first jardiance and then invokana due to recurrent yeast infections. She is on Lantus 18 units daily. Her fasting blood sugars are greater than 300. She is not even checking her 2 hour postprandial sugars. She reports polyuria and polydipsia. She is also complaining of brown foul-smelling vaginal discharge even after taking 2 different rounds of Diflucan. She is not having sex. She has a history of a hysterectomy and therefore this cannot be endometrial spotting. Sounds like it's bacterial vaginosis.  04/06/16 Fasting blood sugars are typically  200 in the morning. She is only seen one sugar lower than 200 and now was 150 last evening. However she is unable to tolerate metformin due to severe diarrhea. She is also on insulin 18 units a day as well as the glipizide.  At that time, my plan was: Discontinue metformin due to diarrhea. Replace with Victoza 1.2 mg subcutaneous daily. Continue insulin 18 units a day as well as glipizide. Recheck fasting blood sugars and two-hour postprandial sugars in 3-4 weeks. If still elevated at that time, I would begin increasing her basal insulin by 1 unit every day until fasting blood sugars fall below 130.  07/27/16 The patient originally scheduled the appointment today because she was concerned about a mole on her neck. Under closer inspection today it is a 3 mm brown papule that appears to be [name]. He has nice well demarcated borders, a light brown color a warty characteristics that is reassuring. However I am concerned about her blood sugars. We have not followed up as originally planned. She states that her sugars can frequently be 240-250. 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 She states that she cannot even feel her left foot. I spent 10 minutes today fussing at the patient. She must take better care of herself. To allow her sugars run in the mid 200s on a consistent basis is only going cause kidney problems possibly strokes possibly blindness in the future. I explained this to the patient in no uncertain terms. I will check a CMP today  along with a hemoglobin A1c. Goal hemoglobin A1c is less than 7. We will need to titrate insulin if her sugars are truly as bad as she says.  At that time, my plan was: Blood pressure today is well controlled. Blood sugar is uncontrolled. Check hemoglobin A1c. Goal hemoglobin A1c is less than 7. From the reports the patient is telling me, it does not appear the glipizide, bydureon, and lantus are effective.  We'll need to uptitrate Lantus until fasting blood sugars are  under 130. I will check a direct LDL cholesterol. Goal LDL cholesterol is less than 100. I reassured the patient that the papule on her neck appears benign. I do not feel that this needs evaluation in a dermatologist office.  01/10/17 Here for follow up.  Currently on lantus 24 daily, bydureon 2mg  weekly, and glipizide xr 10 mg daily.  She has no sugars for me to review but she reports blood sugars typically in the 400s. The lowest she sees her sugars are 290. They have been as high as 500. She reports polyuria, polydipsia, blurry vision. She also reports hypersomnia. She has no energy. She can fall asleep watching TV. She can fall asleep easily after a meal. She compulsively if she is a passenger in a car. She can easily fall asleep while reading. She has near constant fatigue. Her BMI is almost 50 raising the concern for obstructive sleep apnea. Her blood pressure today is well controlled. She denies any chest pain shortness of breath or dyspnea on exertion although she has very low exercise tolerance and is not engaging in any vigorous aerobic exercise  Past Medical History:  Diagnosis Date  . Abnormal vaginal Pap smear    tx with cryotherapy  . Anxiety   . Depression   . Diabetes mellitus without complication (HCC)   . History of kidney stones    passed stone - no surgery  . Hypertension   . Migraine    otc med prn  . Obesity   . Pseudotumor cerebri   . SVD (spontaneous vaginal delivery)    x 3  . Tension headache    Past Surgical History:  Procedure Laterality Date  . ABDOMINAL HYSTERECTOMY    . ANTERIOR CERVICAL DECOMP/DISCECTOMY FUSION N/A 01/03/2016   Procedure: ANTERIOR CERVICAL DECOMPRESSION FUSION CERVICAL FIVE-SIX,CERVICAL SIX-SEVEN.;  Surgeon: Lisbeth Renshaw, MD;  Location: [facility];  Service: Neurosurgery;  Laterality: N/A;  right side approach  . CRYOTHERAPY    . LAPAROSCOPIC ASSISTED VAGINAL HYSTERECTOMY Bilateral 04/21/2014   Procedure: LAPAROSCOPIC ASSISTED  VAGINAL HYSTERECTOMY, BILATERAL SALPINGECTOMY;  Surgeon: Lavina Hamman, MD;  Location: [facility];  Service: Gynecology;  Laterality: Bilateral;  . MANDIBLE FRACTURE SURGERY  2002   x 1  . TUBAL LIGATION  11/2003  . WISDOM TOOTH EXTRACTION     Current Outpatient Prescriptions on File Prior to Visit  Medication Sig Dispense Refill  . acetaZOLAMIDE (DIAMOX) 250 MG tablet TAKE 1 TABLET (250 MG TOTAL) BY MOUTH 2 (TWO) TIMES DAILY. 60 tablet 3  . ALPRAZolam (XANAX) 1 MG tablet TAKE 1 TABLET BY MOUTH THREE TIMES A DAY AS NEEDED 90 tablet 2  . atenolol (TENORMIN) 100 MG tablet Take 100 mg by mouth at bedtime.     Marland Kitchen atorvastatin (LIPITOR) 80 MG tablet Take 1 tablet (80 mg total) by mouth daily. 90 tablet 3  . buPROPion (WELLBUTRIN SR) 150 MG 12 hr tablet Take 150 mg by mouth at bedtime.     Marland Kitchen escitalopram (LEXAPRO) 10 MG tablet  TAKE 1 TABLET (10 MG TOTAL) BY MOUTH DAILY. 30 tablet 6  . Exenatide ER 2 MG PEN Inject 2 mg into the skin once a week. 4 each 11  . fluconazole (DIFLUCAN) 150 MG tablet TAKE 1 TABLET BY MOUTH ONCE IF SYMPTOMS DO NOT RESOLVE OFFICE VISIT IS REQUIRED 1 tablet 0  . glipiZIDE (GLUCOTROL XL) 10 MG 24 hr tablet TAKE 1 TABLET (10 MG TOTAL) BY MOUTH DAILY WITH BREAKFAST. 30 tablet 5  . Insulin Glargine (LANTUS SOLOSTAR) 100 UNIT/ML Solostar Pen Inject 0.18 mLs (18 Units total) into the skin at bedtime increase 1 unit every day until fasting is under 130 (Patient taking differently: 24 Units. Inject 0.18 mLs (18 Units total) into the skin at bedtime increase 1 unit every day until fasting is under 130) 5 pen 5  . Insulin Pen Needle 31G X 8 MM MISC Use with pen QD 100 each 3   No current facility-administered medications on file prior to visit.    No Known Allergies Social History   Social History  . Marital status: Divorced    Spouse name: N/A  . Number of children: N/A  . Years of education: N/A   Occupational History  . Not on file.   Social History Main Topics  . Smoking  status: Never Smoker  . Smokeless tobacco: Never Used  . Alcohol use 0.6 oz/week    1 Glasses of wine per week     Comment: one glass of wine per week  . Drug use: No  . Sexual activity: Yes    Birth control/ protection: Surgical   Other Topics Concern  . Not on file   Social History Narrative  . No narrative on file     Review of Systems  All other systems reviewed and are negative.      Objective:   Physical Exam  Constitutional: She appears well-developed and well-nourished. No distress.  Neck: Neck supple.  Cardiovascular: Normal rate and regular rhythm.   Pulmonary/Chest: Effort normal and breath sounds normal.  Abdominal: Soft. Bowel sounds are normal.  Skin: She is not diaphoretic.  Vitals reviewed.         Assessment & Plan:  Uncontrolled type 2 diabetes mellitus without complication, with long-term current use of insulin (HCC) - Plan: CBC with Differential/Platelet, COMPLETE METABOLIC PANEL WITH GFR, Hemoglobin A1c, TSH, Microalbumin, urine  Class 3 severe obesity due to excess calories with serious comorbidity and body mass index (BMI) of 45.0 to 49.9 in adult [facility]) - Plan: Ambulatory referral to Sleep Studies  Essential hypertension  Hypersomnia - Plan: Ambulatory referral to Sleep Studies  Blood pressure is under good control. Increase Lantus to 50 units. Check fasting blood sugar every day and continue to increase Lantus by 1 unit until fasting blood sugars under 130. Call me in one week with her sugar values so we can make another course adjustment in her basal insulin dose. Patient would be a candidate for gastric bypass but we must control her sugars first. I am concerned she may have obstructive sleep apnea so we will schedule the patient for a sleep study. Reassess in one week

## 2017-01-11 LAB — CBC WITH DIFFERENTIAL/PLATELET
Basophils Absolute: 0 cells/uL (ref 0–200)
Basophils Relative: 0 %
Eosinophils Absolute: 164 cells/uL (ref 15–500)
Eosinophils Relative: 2 %
HCT: 44.7 % (ref 35.0–45.0)
Hemoglobin: 14.6 g/dL (ref 12.0–15.0)
Lymphocytes Relative: 29 %
Lymphs Abs: 2378 cells/uL (ref 850–3900)
MCH: 29.6 pg (ref 27.0–33.0)
MCHC: 32.7 g/dL (ref 32.0–36.0)
MCV: 90.5 fL (ref 80.0–100.0)
MPV: 11.5 fL (ref 7.5–12.5)
Monocytes Absolute: 492 cells/uL (ref 200–950)
Monocytes Relative: 6 %
Neutro Abs: 5166 cells/uL (ref 1500–7800)
Neutrophils Relative %: 63 %
Platelets: 190 10*3/uL (ref 140–400)
RBC: 4.94 MIL/uL (ref 3.80–5.10)
RDW: 13.3 % (ref 11.0–15.0)
WBC: 8.2 10*3/uL (ref 3.8–10.8)

## 2017-01-11 LAB — COMPLETE METABOLIC PANEL WITH GFR
ALT: 125 U/L — ABNORMAL HIGH (ref 6–29)
AST: 72 U/L — ABNORMAL HIGH (ref 10–35)
Albumin: 3.7 g/dL (ref 3.6–5.1)
Alkaline Phosphatase: 149 U/L — ABNORMAL HIGH (ref 33–115)
BUN: 18 mg/dL (ref 7–25)
CO2: 21 mmol/L (ref 20–31)
Calcium: 9.2 mg/dL (ref 8.6–10.2)
Chloride: 99 mmol/L (ref 98–110)
Creat: 1.01 mg/dL (ref 0.50–1.10)
GFR, Est African American: 77 mL/min (ref >=60)
GFR, Est Non African American: 67 mL/min (ref >=60)
Glucose, Bld: 386 mg/dL — ABNORMAL HIGH (ref 70–99)
Potassium: 4.3 mmol/L (ref 3.5–5.3)
Sodium: 135 mmol/L (ref 135–146)
Total Bilirubin: 0.5 mg/dL (ref 0.2–1.2)
Total Protein: 6.6 g/dL (ref 6.1–8.1)

## 2017-01-11 LAB — HEMOGLOBIN A1C
Hgb A1c MFr Bld: 10.9 % — ABNORMAL HIGH (ref <5.7)
Mean Plasma Glucose: 266 mg/dL

## 2017-01-12 LAB — HEPATITIS PANEL, ACUTE
HCV Ab: NEGATIVE
Hep A IgM: NONREACTIVE
Hep B C IgM: NONREACTIVE
Hepatitis B Surface Ag: NEGATIVE

## 2017-01-14 ENCOUNTER — Other Ambulatory Visit: Payer: Self-pay | Admitting: Family Medicine

## 2017-01-14 DIAGNOSIS — R7989 Other specified abnormal findings of blood chemistry: Secondary | ICD-10-CM

## 2017-01-14 DIAGNOSIS — R945 Abnormal results of liver function studies: Principal | ICD-10-CM

## 2017-01-18 ENCOUNTER — Other Ambulatory Visit: Payer: Self-pay | Admitting: Family Medicine

## 2017-01-18 ENCOUNTER — Telehealth: Payer: Self-pay | Admitting: Family Medicine

## 2017-01-18 MED ORDER — ALPRAZOLAM 1 MG PO TABS: 1.0000 mg | ORAL_TABLET | Freq: Three times a day (TID) | ORAL | 2 refills | Status: DC | PRN

## 2017-01-18 NOTE — Telephone Encounter (Signed)
Questions answered on Lipitor and informed pt to continue to increase her insulin until FBS are under 130 per [facility] LOV. Pt also wanted to know if she could get a refill on her Xanax - per [facility] ok [facility]

## 2017-01-18 NOTE — Telephone Encounter (Signed)
Last OV  7/19 Last refill 4/26 Ok to refill?

## 2017-01-18 NOTE — Telephone Encounter (Signed)
Pt has questions about lipitor that she was taking and does she still need to continue increasing the insulin. Please call her back. If you cant get her on her cell try her work [phone].

## 2017-01-30 ENCOUNTER — Ambulatory Visit: Admission: RE | Admit: 2017-01-30 | Payer: Medicaid Other | Source: Ambulatory Visit

## 2017-02-05 ENCOUNTER — Ambulatory Visit
Admission: RE | Admit: 2017-02-05 | Discharge: 2017-02-05 | Disposition: A | Discharge status: Home / Self Care | Payer: Medicaid Other | Source: Ambulatory Visit | Attending: Family Medicine | Admitting: Family Medicine

## 2017-02-05 DIAGNOSIS — R7989 Other specified abnormal findings of blood chemistry: Secondary | ICD-10-CM | POA: Diagnosis present

## 2017-02-05 DIAGNOSIS — K760 Fatty (change of) liver, not elsewhere classified: Secondary | ICD-10-CM | POA: Insufficient documentation

## 2017-02-05 DIAGNOSIS — R945 Abnormal results of liver function studies: Secondary | ICD-10-CM

## 2017-02-06 ENCOUNTER — Telehealth: Payer: Self-pay

## 2017-02-06 DIAGNOSIS — R945 Abnormal results of liver function studies: Principal | ICD-10-CM

## 2017-02-06 DIAGNOSIS — R7989 Other specified abnormal findings of blood chemistry: Secondary | ICD-10-CM

## 2017-02-06 NOTE — Telephone Encounter (Signed)
Provided lab results pt wants to know if she should continue lipitor and start insulin back to normal. Patient states she was told to stop until results came back. Pls advise

## 2017-02-07 ENCOUNTER — Other Ambulatory Visit: Payer: Self-pay | Admitting: Family Medicine

## 2017-02-07 ENCOUNTER — Telehealth: Payer: Self-pay | Admitting: Neurology

## 2017-02-07 ENCOUNTER — Institutional Professional Consult (permissible substitution): Payer: Medicaid Other | Admitting: Neurology

## 2017-02-07 NOTE — Telephone Encounter (Signed)
Definitely stay on insulin.  I need to know what dose of insulin she is on and what her sugars are running.  Recheck LFT in 6 weeks and then see if better, may resume lipitor then.

## 2017-02-07 NOTE — Telephone Encounter (Signed)
Pt has called and cancelled today's apt that was scheduled for 1pm. This will count as a no show.

## 2017-02-11 NOTE — Telephone Encounter (Signed)
Patient states her blood sugar readings has been running between 163-192 the past week the lowest it has been is fasting 143  Pateint states she injects 68 units of insulin daily and would like to know if you felt that she should have her glucose monitored by an endocrinology. Pls advise

## 2017-02-11 NOTE — Telephone Encounter (Signed)
Do not feel she needs endocrinologist if sugars are improving.  I would up Lantus to 75 units daily and that should do it.

## 2017-02-11 NOTE — Telephone Encounter (Signed)
Patient aware of providers recommendations.  

## 2017-03-12 ENCOUNTER — Institutional Professional Consult (permissible substitution): Payer: Medicaid Other | Admitting: Neurology

## 2017-03-12 ENCOUNTER — Telehealth: Payer: Self-pay | Admitting: Neurology

## 2017-03-12 NOTE — Telephone Encounter (Signed)
Pt no showed to apt today 

## 2017-03-13 ENCOUNTER — Encounter: Payer: Self-pay | Admitting: Neurology

## 2017-03-18 ENCOUNTER — Telehealth: Payer: Self-pay | Admitting: Neurology

## 2017-03-18 NOTE — Telephone Encounter (Signed)
2 no shows- may dismiss. CD

## 2017-03-18 NOTE — Telephone Encounter (Signed)
Pt cancelled apt on 02/07/17 without giving 24 hour notice. Then on 03/12/17 pt no showed. Does Dr. Vickey Huger want to dismiss pt?

## 2017-03-20 ENCOUNTER — Encounter: Payer: Self-pay | Admitting: Neurology

## 2017-03-25 ENCOUNTER — Other Ambulatory Visit: Payer: Self-pay

## 2017-03-26 ENCOUNTER — Encounter: Payer: Self-pay | Admitting: Neurology

## 2017-04-01 ENCOUNTER — Other Ambulatory Visit: Payer: Self-pay

## 2017-04-09 ENCOUNTER — Encounter: Payer: Self-pay | Admitting: Family Medicine

## 2017-04-09 ENCOUNTER — Ambulatory Visit (INDEPENDENT_AMBULATORY_CARE_PROVIDER_SITE_OTHER): Payer: Medicaid Other | Admitting: Family Medicine

## 2017-04-09 VITALS — BP 128/88 | HR 68 | Temp 97.9°F | Resp 16 | Ht 61.0 in | Wt 267.0 lb

## 2017-04-09 DIAGNOSIS — E1165 Type 2 diabetes mellitus with hyperglycemia: Secondary | ICD-10-CM | POA: Diagnosis not present

## 2017-04-09 DIAGNOSIS — IMO0001 Reserved for inherently not codable concepts without codable children: Secondary | ICD-10-CM

## 2017-04-09 DIAGNOSIS — Z794 Long term (current) use of insulin: Secondary | ICD-10-CM | POA: Diagnosis not present

## 2017-04-09 NOTE — Progress Notes (Signed)
Subjective:    Patient ID: Kimberly [name], female    DOB: 1971/06/06, 46 y.o.   MRN: [id number]  Medication Refill   Diabetes    12/16/15 Approximately 6 weeks ago, sugars became controlled. The highest blood sugar the patient saw was  approximately 140. She was on a combination of metformin and jardiance.  However she was started on prednisone for a pinched nerve in her neck. After that point her sugars have spiraled out of control. There are now 400-600. She reports polyuria, polydipsia, and blurred vision. Today her sugars are 400. She denies any chest pain although she does report dry mouth. She is still taking metformin and jardiance.  At that time, my plan was: Sugars are uncontrolled and I believe we need to start insulin to gain some sense of control. Add glargine 7 units daily today. The patient will check her fasting blood sugar every morning and increase by 1 unit every day until fasting blood sugars are under 130. She will call me back next week and notify me of the sugar value so we can make gross adjustments. I anticipate that she may require anywhere from 30-60 units total of glargine. We will see how she responds to low-dose insulin over the weekend.  03/23/16 Wt Readings from Last 3 Encounters:  04/09/17 267 lb (121.1 kg)  01/10/17 262 lb (118.8 kg)  07/27/16 261 lb (118.4 kg)  Patient is currently taking metformin. She discontinued first jardiance and then invokana due to recurrent yeast infections. She is on Lantus 18 units daily. Her fasting blood sugars are greater than 300. She is not even checking her 2 hour postprandial sugars. She reports polyuria and polydipsia. She is also complaining of brown foul-smelling vaginal discharge even after taking 2 different rounds of Diflucan. She is not having sex. She has a history of a hysterectomy and therefore this cannot be endometrial spotting. Sounds like it's bacterial vaginosis.  04/06/16 Fasting blood sugars are typically  200 in the morning. She is only seen one sugar lower than 200 and now was 150 last evening. However she is unable to tolerate metformin due to severe diarrhea. She is also on insulin 18 units a day as well as the glipizide.  At that time, my plan was: Discontinue metformin due to diarrhea. Replace with Victoza 1.2 mg subcutaneous daily. Continue insulin 18 units a day as well as glipizide. Recheck fasting blood sugars and two-hour postprandial sugars in 3-4 weeks. If still elevated at that time, I would begin increasing her basal insulin by 1 unit every day until fasting blood sugars fall below 130.  07/27/16 The patient originally scheduled the appointment today because she was concerned about a mole on her neck. Under closer inspection today it is a 3 mm brown papule that appears to be a Seabury keratoses. He has nice well demarcated borders, a light brown color a warty characteristics that is reassuring. However I am concerned about her blood sugars. We have not followed up as originally planned. She states that her sugars can frequently be 240-250. 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 She states that she cannot even feel her left foot. I spent 10 minutes today fussing at the patient. She must take better care of herself. To allow her sugars run in the mid 200s on a consistent basis is only going cause kidney problems possibly strokes possibly blindness in the future. I explained this to the patient in no uncertain terms. I will check a CMP today  along with a hemoglobin A1c. Goal hemoglobin A1c is less than 7. We will need to titrate insulin if her sugars are truly as bad as she says.  At that time, my plan was: Blood pressure today is well controlled. Blood sugar is uncontrolled. Check hemoglobin A1c. Goal hemoglobin A1c is less than 7. From the reports the patient is telling me, it does not appear the glipizide, bydureon, and lantus are effective.  We'll need to uptitrate Lantus until fasting blood sugars are  under 130. I will check a direct LDL cholesterol. Goal LDL cholesterol is less than 100. I reassured the patient that the papule on her neck appears benign. I do not feel that this needs evaluation in a dermatologist office.  01/10/17 Here for follow up.  Currently on lantus 24 daily, bydureon 2mg  weekly, and glipizide xr 10 mg daily.  She has no sugars for me to review but she reports blood sugars typically in the 400s. The lowest she sees her sugars are 290. They have been as high as 500. She reports polyuria, polydipsia, blurry vision. She also reports hypersomnia. She has no energy. She can fall asleep watching TV. She can fall asleep easily after a meal. She compulsively if she is a passenger in a car. She can easily fall asleep while reading. She has near constant fatigue. Her BMI is almost 50 raising the concern for obstructive sleep apnea. Her blood pressure today is well controlled. She denies any chest pain shortness of breath or dyspnea on exertion although she has very low exercise tolerance and is not engaging in any vigorous aerobic exercise.  At that time, my plan was: Blood pressure is under good control. Increase Lantus to 50 units. Check fasting blood sugar every day and continue to increase Lantus by 1 unit until fasting blood sugars under 130. Call me in one week with her sugar values so we can make another course adjustment in her basal insulin dose. Patient would be a candidate for gastric bypass but we must control her sugars first. I am concerned she may have obstructive sleep apnea so we will schedule the patient for a sleep study. Reassess in one week  04/09/17 Patient was ultimately increased to Lantus75 units a day and is currently on once a week bydureon.  Fasting blood sugars can range between 120 and 300 but the vast majority are between 180 and 200. She denies any hypoglycemia. She admits to eating a poor diet and is frequently eating fast food. She is under tremendous stress.   She has been arrested and asked to serve a 30 day jail sentence for stealing from her previous boss. She will be fired. She is losing her home. She has no help with her 46 year old and 37 year old children. As a result she is eating fast food on a daily basis, she is not exercising, she is not taking care of herself  Past Medical History:  Diagnosis Date  . Abnormal vaginal Pap smear    tx with cryotherapy  . Anxiety   . Depression   . Diabetes mellitus without complication (HCC)   . History of kidney stones    passed stone - no surgery  . Hypertension   . Migraine    otc med prn  . Obesity   . Pseudotumor cerebri   . SVD (spontaneous vaginal delivery)    x 3  . Tension headache    Past Surgical History:  Procedure Laterality Date  . ABDOMINAL HYSTERECTOMY    .  ANTERIOR CERVICAL DECOMP/DISCECTOMY FUSION N/A 01/03/2016   Procedure: ANTERIOR CERVICAL DECOMPRESSION FUSION CERVICAL FIVE-SIX,CERVICAL SIX-SEVEN.;  Surgeon: Lisbeth Renshaw, MD;  Location: [facility];  Service: Neurosurgery;  Laterality: N/A;  right side approach  . CRYOTHERAPY    . LAPAROSCOPIC ASSISTED VAGINAL HYSTERECTOMY Bilateral 04/21/2014   Procedure: LAPAROSCOPIC ASSISTED VAGINAL HYSTERECTOMY, BILATERAL SALPINGECTOMY;  Surgeon: Lavina Hamman, MD;  Location: [facility];  Service: Gynecology;  Laterality: Bilateral;  . MANDIBLE FRACTURE SURGERY  2002   x 1  . TUBAL LIGATION  11/2003  . WISDOM TOOTH EXTRACTION     Current Outpatient Prescriptions on File Prior to Visit  Medication Sig Dispense Refill  . acetaZOLAMIDE (DIAMOX) 250 MG tablet TAKE 1 TABLET (250 MG TOTAL) BY MOUTH 2 (TWO) TIMES DAILY. 60 tablet 3  . ALPRAZolam (XANAX) 1 MG tablet Take 1 tablet (1 mg total) by mouth 3 (three) times daily as needed. 90 tablet 2  . atenolol (TENORMIN) 100 MG tablet Take 100 mg by mouth at bedtime.     Marland Kitchen buPROPion (WELLBUTRIN SR) 150 MG 12 hr tablet Take 150 mg by mouth at bedtime.     Marland Kitchen escitalopram (LEXAPRO) 10 MG tablet  TAKE 1 TABLET (10 MG TOTAL) BY MOUTH DAILY. 30 tablet 6  . Exenatide ER 2 MG PEN Inject 2 mg into the skin once a week. 4 each 11  . glipiZIDE (GLUCOTROL XL) 10 MG 24 hr tablet TAKE 1 TABLET (10 MG TOTAL) BY MOUTH DAILY WITH BREAKFAST. 30 tablet 5  . Insulin Glargine (LANTUS SOLOSTAR) 100 UNIT/ML Solostar Pen Inject 0.18 mLs (18 Units total) into the skin at bedtime increase 1 unit every day until fasting is under 130 (Patient taking differently: 70 Units. Inject 0.18 mLs (18 Units total) into the skin at bedtime increase 1 unit every day until fasting is under 130) 5 pen 5  . Insulin Pen Needle 31G X 8 MM MISC Use with pen QD 100 each 3   No current facility-administered medications on file prior to visit.    No Known Allergies Social History   Social History  . Marital status: Divorced    Spouse name: N/A  . Number of children: N/A  . Years of education: N/A   Occupational History  . Not on file.   Social History Main Topics  . Smoking status: Never Smoker  . Smokeless tobacco: Never Used  . Alcohol use 0.6 oz/week    1 Glasses of wine per week     Comment: one glass of wine per week  . Drug use: No  . Sexual activity: Yes    Birth control/ protection: Surgical   Other Topics Concern  . Not on file   Social History Narrative  . No narrative on file     Review of Systems  All other systems reviewed and are negative.      Objective:   Physical Exam  Constitutional: She appears well-developed and well-nourished. No distress.  Neck: Neck supple.  Cardiovascular: Normal rate and regular rhythm.   Pulmonary/Chest: Effort normal and breath sounds normal.  Abdominal: Soft. Bowel sounds are normal.  Skin: She is not diaphoretic.  Vitals reviewed.         Assessment & Plan:  Uncontrolled type 2 diabetes mellitus without complication, with long-term current use of insulin (HCC)  Switch Lantus to 45 units twice a day. Continue her other medications. Discussed a low  carbohydrate diet, discontinuation of fast food, eating less starch such as bread, potatoes, rice, and  pasta. Eating more fruits and vegetables.  I also recommended increasing aerobic exercise and weight loss. However at the present time, she is doing much larger issues. I spent more than 30 minutes with the patient discussing life strategies on how to handle this. She first needs to admit the truth to her children rather than  She now needs to find care for her children for the next 30 days. She needs to find housing after she is released from jail and she needs to locate a new job. She needs to tackle these issues in that order. We discussed resources as well to handle these issues.

## 2017-04-16 ENCOUNTER — Other Ambulatory Visit: Payer: Self-pay | Admitting: Family Medicine

## 2017-04-16 NOTE — Telephone Encounter (Signed)
ok 

## 2017-04-16 NOTE — Telephone Encounter (Signed)
Ok to refill 

## 2017-06-06 ENCOUNTER — Other Ambulatory Visit: Payer: Self-pay | Admitting: Family Medicine

## 2017-06-06 DIAGNOSIS — E1100 Type 2 diabetes mellitus with hyperosmolarity without nonketotic hyperglycemic-hyperosmolar coma (NKHHC): Secondary | ICD-10-CM

## 2017-06-12 ENCOUNTER — Telehealth: Payer: Self-pay | Admitting: *Deleted

## 2017-06-12 NOTE — Telephone Encounter (Signed)
Received VM from patient.   Reports that she is taking Glipizide and Lantus 70U as prescribed. States that CBG remain elevated and lowest BS noted at 516.  MD please advise.

## 2017-06-13 NOTE — Telephone Encounter (Signed)
Call placed to patient and patient made aware. Verbalized understanding.   Advised that her FSBS noted at 453 today. States that Insulin needle is dated for 08/23/2018.  Appointment scheduled for 06/17/2017.

## 2017-06-13 NOTE — Telephone Encounter (Signed)
Call placed to patient.   States that she is currently at work and will call back after lunch.

## 2017-06-13 NOTE — Telephone Encounter (Signed)
Just to verify, she is on 70 units of insulin and her LOWEST sugar is over 500!  She needs to see endocrinology asap.  Meanwhile, has her insulin gone bad (been left out for extended period) and is her meter accurate?  If her sugars are all over 500 and she believes her insulin is still good, I would split lantus into 50 BID and then Recheck sugars on Monday to make further correction.

## 2017-06-17 ENCOUNTER — Ambulatory Visit: Payer: Self-pay | Admitting: Family Medicine

## 2017-06-21 ENCOUNTER — Ambulatory Visit: Payer: Medicaid Other | Admitting: Family Medicine

## 2017-06-21 ENCOUNTER — Other Ambulatory Visit: Payer: Self-pay

## 2017-06-21 ENCOUNTER — Encounter: Payer: Self-pay | Admitting: Family Medicine

## 2017-06-21 VITALS — BP 134/78 | HR 80 | Temp 98.7°F | Resp 14 | Ht 61.0 in | Wt 267.0 lb

## 2017-06-21 DIAGNOSIS — E1165 Type 2 diabetes mellitus with hyperglycemia: Secondary | ICD-10-CM

## 2017-06-21 DIAGNOSIS — Z794 Long term (current) use of insulin: Secondary | ICD-10-CM

## 2017-06-21 DIAGNOSIS — IMO0001 Reserved for inherently not codable concepts without codable children: Secondary | ICD-10-CM

## 2017-06-21 MED ORDER — INSULIN LISPRO 100 UNIT/ML (KWIKPEN): 15.0000 [IU] | PEN_INJECTOR | Freq: Three times a day (TID) | SUBCUTANEOUS | 3 refills | Status: DC

## 2017-06-21 NOTE — Progress Notes (Signed)
Subjective:    Patient ID: Kimberly [name], female    DOB: 1970/09/12, 46 y.o.   MRN: [id number]  Medication Refill   Diabetes    12/16/15 Approximately 6 weeks ago, sugars became controlled. The highest blood sugar the patient saw was  approximately 140. She was on a combination of metformin and jardiance.  However she was started on prednisone for a pinched nerve in her neck. After that point her sugars have spiraled out of control. There are now 400-600. She reports polyuria, polydipsia, and blurred vision. Today her sugars are 400. She denies any chest pain although she does report dry mouth. She is still taking metformin and jardiance.  At that time, my plan was: Sugars are uncontrolled and I believe we need to start insulin to gain some sense of control. Add glargine 7 units daily today. The patient will check her fasting blood sugar every morning and increase by 1 unit every day until fasting blood sugars are under 130. She will call me back next week and notify me of the sugar value so we can make gross adjustments. I anticipate that she may require anywhere from 30-60 units total of glargine. We will see how she responds to low-dose insulin over the weekend.  03/23/16 Wt Readings from Last 3 Encounters:  06/21/17 267 lb (121.1 kg)  04/09/17 267 lb (121.1 kg)  01/10/17 262 lb (118.8 kg)  Patient is currently taking metformin. She discontinued first jardiance and then invokana due to recurrent yeast infections. She is on Lantus 18 units daily. Her fasting blood sugars are greater than 300. She is not even checking her 2 hour postprandial sugars. She reports polyuria and polydipsia. She is also complaining of brown foul-smelling vaginal discharge even after taking 2 different rounds of Diflucan. She is not having sex. She has a history of a hysterectomy and therefore this cannot be endometrial spotting. Sounds like it's bacterial vaginosis.  04/06/16 Fasting blood sugars are typically  200 in the morning. She is only seen one sugar lower than 200 and now was 150 last evening. However she is unable to tolerate metformin due to severe diarrhea. She is also on insulin 18 units a day as well as the glipizide.  At that time, my plan was: Discontinue metformin due to diarrhea. Replace with Victoza 1.2 mg subcutaneous daily. Continue insulin 18 units a day as well as glipizide. Recheck fasting blood sugars and two-hour postprandial sugars in 3-4 weeks. If still elevated at that time, I would begin increasing her basal insulin by 1 unit every day until fasting blood sugars fall below 130.  07/27/16 The patient originally scheduled the appointment today because she was concerned about a mole on her neck. Under closer inspection today it is a 3 mm brown papule that appears to be a Seabury keratoses. He has nice well demarcated borders, a light brown color a warty characteristics that is reassuring. However I am concerned about her blood sugars. We have not followed up as originally planned. She states that her sugars can frequently be 240-250. 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 She states that she cannot even feel her left foot. I spent 10 minutes today fussing at the patient. She must take better care of herself. To allow her sugars run in the mid 200s on a consistent basis is only going cause kidney problems possibly strokes possibly blindness in the future. I explained this to the patient in no uncertain terms. I will check a CMP today  along with a hemoglobin A1c. Goal hemoglobin A1c is less than 7. We will need to titrate insulin if her sugars are truly as bad as she says.  At that time, my plan was: Blood pressure today is well controlled. Blood sugar is uncontrolled. Check hemoglobin A1c. Goal hemoglobin A1c is less than 7. From the reports the patient is telling me, it does not appear the glipizide, bydureon, and lantus are effective.  We'll need to uptitrate Lantus until fasting blood sugars are  under 130. I will check a direct LDL cholesterol. Goal LDL cholesterol is less than 100. I reassured the patient that the papule on her neck appears benign. I do not feel that this needs evaluation in a dermatologist office.  01/10/17 Here for follow up.  Currently on lantus 24 daily, bydureon 2mg  weekly, and glipizide xr 10 mg daily.  She has no sugars for me to review but she reports blood sugars typically in the 400s. The lowest she sees her sugars are 290. They have been as high as 500. She reports polyuria, polydipsia, blurry vision. She also reports hypersomnia. She has no energy. She can fall asleep watching TV. She can fall asleep easily after a meal. She compulsively if she is a passenger in a car. She can easily fall asleep while reading. She has near constant fatigue. Her BMI is almost 50 raising the concern for obstructive sleep apnea. Her blood pressure today is well controlled. She denies any chest pain shortness of breath or dyspnea on exertion although she has very low exercise tolerance and is not engaging in any vigorous aerobic exercise.  At that time, my plan was: Blood pressure is under good control. Increase Lantus to 50 units. Check fasting blood sugar every day and continue to increase Lantus by 1 unit until fasting blood sugars under 130. Call me in one week with her sugar values so we can make another course adjustment in her basal insulin dose. Patient would be a candidate for gastric bypass but we must control her sugars first. I am concerned she may have obstructive sleep apnea so we will schedule the patient for a sleep study. Reassess in one week  04/09/17 Patient was ultimately increased to Lantus75 units a day and is currently on once a week bydureon.  Fasting blood sugars can range between 120 and 300 but the vast majority are between 180 and 200. She denies any hypoglycemia. She admits to eating a poor diet and is frequently eating fast food. She is under tremendous stress.   She has been arrested and asked to serve a 30 day jail sentence for stealing from her previous boss. She will be fired. She is losing her home. She has no help with her 46 year old and 24 year old children. As a result she is eating fast food on a daily basis, she is not exercising, she is not taking care of herself.  At that time, my plan was: Switch Lantus to 45 units twice a day. Continue her other medications. Discussed a low carbohydrate diet, discontinuation of fast food, eating less starch such as bread, potatoes, rice, and pasta. Eating more fruits and vegetables.  I also recommended increasing aerobic exercise and weight loss. However at the present time, she is doing much larger issues. I spent more than 30 minutes with the patient discussing life strategies on how to handle this. She first needs to admit the truth to her children rather than  She now needs to find care for her children  for the next 30 days. She needs to find housing after she is released from jail and she needs to locate a new job. She needs to tackle these issues in that order. We discussed resources as well to handle these issues.  06/21/17 Patient called 2 weeks ago stating that her blood sugars were greater than 500 despite taking high doses of insulin. I recommended increasing her Lantus to 50 units twice a day. Patient then missed her next appointment. She is here today for follow-up. She is adamant that she is compliant with her insulin 50 units twice a day. She is also taking her Bydureon on once weekly. She is taking Glucotrol but I feel that this is providing minimal benefit at all. She states that her fasting blood sugars are greater than 330. Her postprandial sugars are even higher. She denies polyuria or weight loss or blurry vision. She denies any chest pain shortness of breath nausea or vomiting  Past Medical History:  Diagnosis Date  . Abnormal vaginal Pap smear    tx with cryotherapy  . Anxiety   . Depression     . Diabetes mellitus without complication (HCC)   . History of kidney stones    passed stone - no surgery  . Hypertension   . Migraine    otc med prn  . Obesity   . Pseudotumor cerebri   . SVD (spontaneous vaginal delivery)    x 3  . Tension headache    Past Surgical History:  Procedure Laterality Date  . ABDOMINAL HYSTERECTOMY    . ANTERIOR CERVICAL DECOMP/DISCECTOMY FUSION N/A 01/03/2016   Procedure: ANTERIOR CERVICAL DECOMPRESSION FUSION CERVICAL FIVE-SIX,CERVICAL SIX-SEVEN.;  Surgeon: Lisbeth Renshaw, MD;  Location: [facility];  Service: Neurosurgery;  Laterality: N/A;  right side approach  . CRYOTHERAPY    . LAPAROSCOPIC ASSISTED VAGINAL HYSTERECTOMY Bilateral 04/21/2014   Procedure: LAPAROSCOPIC ASSISTED VAGINAL HYSTERECTOMY, BILATERAL SALPINGECTOMY;  Surgeon: Lavina Hamman, MD;  Location: [facility];  Service: Gynecology;  Laterality: Bilateral;  . MANDIBLE FRACTURE SURGERY  2002   x 1  . TUBAL LIGATION  11/2003  . WISDOM TOOTH EXTRACTION     Current Outpatient Medications on File Prior to Visit  Medication Sig Dispense Refill  . acetaZOLAMIDE (DIAMOX) 250 MG tablet TAKE 1 TABLET (250 MG TOTAL) BY MOUTH 2 (TWO) TIMES DAILY. 60 tablet 3  . ALPRAZolam (XANAX) 1 MG tablet TAKE 1 TABLET 3 TIMES A DAY AS NEEDED 90 tablet 2  . atenolol (TENORMIN) 100 MG tablet Take 100 mg by mouth at bedtime.     Marland Kitchen buPROPion (WELLBUTRIN SR) 150 MG 12 hr tablet Take 1 tablet (150 mg total) by mouth daily. 30 tablet 5  . escitalopram (LEXAPRO) 10 MG tablet TAKE 1 TABLET (10 MG TOTAL) BY MOUTH DAILY. 30 tablet 6  . Exenatide ER 2 MG PEN Inject 2 mg into the skin once a week. 4 each 11  . glipiZIDE (GLUCOTROL XL) 10 MG 24 hr tablet TAKE 1 TABLET (10 MG TOTAL) BY MOUTH DAILY WITH BREAKFAST. 30 tablet 3  . Insulin Pen Needle 31G X 8 MM MISC Use with pen QD 100 each 3  . LANTUS SOLOSTAR 100 UNIT/ML Solostar Pen INJECT 18 UNITS TOTAL IN THE SKIN AT BEDTIME INCREASE 1 UNIT EVERY DAY TIL FASTING IS UNDER  130 15 pen 3   No current facility-administered medications on file prior to visit.    No Known Allergies Social History   Socioeconomic History  . Marital status: Divorced  Spouse name: Not on file  . Number of children: Not on file  . Years of education: Not on file  . Highest education level: Not on file  Social Needs  . Financial resource strain: Not on file  . Food insecurity - worry: Not on file  . Food insecurity - inability: Not on file  . Transportation needs - medical: Not on file  . Transportation needs - non-medical: Not on file  Occupational History  . Not on file  Tobacco Use  . Smoking status: Never Smoker  . Smokeless tobacco: Never Used  Substance and Sexual Activity  . Alcohol use: Yes    Alcohol/week: 0.6 oz    Types: 1 Glasses of wine per week    Comment: one glass of wine per week  . Drug use: No  . Sexual activity: Yes    Birth control/protection: Surgical  Other Topics Concern  . Not on file  Social History Narrative  . Not on file     Review of Systems  All other systems reviewed and are negative.      Objective:   Physical Exam  Constitutional: She appears well-developed and well-nourished. No distress.  Neck: Neck supple.  Cardiovascular: Normal rate and regular rhythm.  Pulmonary/Chest: Effort normal and breath sounds normal.  Abdominal: Soft. Bowel sounds are normal.  Skin: She is not diaphoretic.  Vitals reviewed.         Assessment & Plan:  Uncontrolled type 2 diabetes mellitus without complication, with long-term current use of insulin (HCC) - Plan: Ambulatory referral to Endocrinology  Patient's blood sugars between 300-400 despite being on over 100 units of long-acting insulin.  I have asked the patient to continue Lantus 50 units twice daily but add Humalog 15 units of rapid acting insulin with each meal. I will increase rapid acting insulin as tolerated. Check fasting blood sugars and sugars prior to meals. Review  these blood sugars in one week to make further adjustments. Consult endocrinology as I'm unable to adequately control her blood sugars at this point. I believe the patient is effectively now a type I.5 diabetic. I have recommended that the patient discontinue glipizide as I believe this is ineffective as she is now clearly insulin dependent.

## 2017-06-27 ENCOUNTER — Other Ambulatory Visit: Payer: Self-pay | Admitting: Family Medicine

## 2017-06-28 ENCOUNTER — Other Ambulatory Visit: Payer: Self-pay | Admitting: Family Medicine

## 2017-06-28 MED ORDER — PSEUDOEPHEDRINE HCL 60 MG PO TABS: 60.0000 mg | ORAL_TABLET | Freq: Two times a day (BID) | ORAL | 0 refills | Status: DC | PRN

## 2017-06-28 MED ORDER — INSULIN GLARGINE 100 UNIT/ML SOLOSTAR PEN: 50.0000 [IU] | PEN_INJECTOR | Freq: Two times a day (BID) | SUBCUTANEOUS | 3 refills | Status: DC

## 2017-07-11 ENCOUNTER — Other Ambulatory Visit: Payer: Self-pay | Admitting: Family Medicine

## 2017-07-11 NOTE — Telephone Encounter (Signed)
Ok to refill??  Last office visit 06/21/2017.  Last refill 04/16/2017, #2 refills.

## 2017-07-25 ENCOUNTER — Ambulatory Visit: Payer: Medicaid Other | Admitting: Family Medicine

## 2017-07-25 ENCOUNTER — Encounter: Payer: Self-pay | Admitting: Family Medicine

## 2017-07-25 VITALS — BP 138/80 | HR 94 | Temp 98.1°F | Resp 18 | Ht 61.0 in | Wt 269.0 lb

## 2017-07-25 DIAGNOSIS — R509 Fever, unspecified: Secondary | ICD-10-CM

## 2017-07-25 DIAGNOSIS — J029 Acute pharyngitis, unspecified: Secondary | ICD-10-CM | POA: Diagnosis not present

## 2017-07-25 MED ORDER — CETIRIZINE HCL 10 MG PO TABS: 10.0000 mg | ORAL_TABLET | Freq: Every day | ORAL | 11 refills | Status: DC

## 2017-07-25 NOTE — Progress Notes (Signed)
Subjective:    Patient ID: Kimberly [name], female    DOB: 12-19-1970, 47 y.o.   MRN: [id number]  HPI  Symptoms began about 1 week ago with rhinorrhea, head congestion, sore throat, and cough.  She complains of bilateral ear pain, rhinorrhea.  Worst symptom is her sore throat.  She has occasional cough.  She also reports mild body aches.  She denies any nausea or vomiting or diarrhea.  She denies any shortness of breath. Past Medical History:  Diagnosis Date  . Abnormal vaginal Pap smear    tx with cryotherapy  . Anxiety   . Depression   . Diabetes mellitus without complication (HCC)   . History of kidney stones    passed stone - no surgery  . Hypertension   . Migraine    otc med prn  . Obesity   . Pseudotumor cerebri   . SVD (spontaneous vaginal delivery)    x 3  . Tension headache    Past Surgical History:  Procedure Laterality Date  . ABDOMINAL HYSTERECTOMY    . ANTERIOR CERVICAL DECOMP/DISCECTOMY FUSION N/A 01/03/2016   Procedure: ANTERIOR CERVICAL DECOMPRESSION FUSION CERVICAL FIVE-SIX,CERVICAL SIX-SEVEN.;  Surgeon: Lisbeth Renshaw, MD;  Location: [facility];  Service: Neurosurgery;  Laterality: N/A;  right side approach  . CRYOTHERAPY    . LAPAROSCOPIC ASSISTED VAGINAL HYSTERECTOMY Bilateral 04/21/2014   Procedure: LAPAROSCOPIC ASSISTED VAGINAL HYSTERECTOMY, BILATERAL SALPINGECTOMY;  Surgeon: Lavina Hamman, MD;  Location: [facility];  Service: Gynecology;  Laterality: Bilateral;  . MANDIBLE FRACTURE SURGERY  2002   x 1  . TUBAL LIGATION  11/2003  . WISDOM TOOTH EXTRACTION     Current Outpatient Medications on File Prior to Visit  Medication Sig Dispense Refill  . acetaZOLAMIDE (DIAMOX) 250 MG tablet TAKE 1 TABLET (250 MG TOTAL) BY MOUTH 2 (TWO) TIMES DAILY. 60 tablet 3  . ALPRAZolam (XANAX) 1 MG tablet TAKE 1 TABLET BY MOUTH 3 TIMES A DAY AS NEEDED 90 tablet 2  . atenolol (TENORMIN) 100 MG tablet TAKE ONE TABLET BY MOUTH ONCE DAILY 30 tablet 9  . buPROPion  (WELLBUTRIN SR) 150 MG 12 hr tablet Take 1 tablet (150 mg total) by mouth daily. 30 tablet 5  . BYDUREON 2 MG PEN INJECT 2 MG INTO THE SKIN ONCE A WEEK. 4 each 2  . escitalopram (LEXAPRO) 10 MG tablet TAKE 1 TABLET (10 MG TOTAL) BY MOUTH DAILY. 30 tablet 6  . Exenatide ER 2 MG PEN Inject 2 mg into the skin once a week. 4 each 11  . Insulin Glargine (LANTUS SOLOSTAR) 100 UNIT/ML Solostar Pen Inject 50 Units into the skin 2 (two) times daily. 50 units bid 15 pen 3  . insulin lispro (HUMALOG KWIKPEN) 100 UNIT/ML KiwkPen Inject 0.15 mLs (15 Units total) into the skin 3 (three) times daily. 15 mL 3  . Insulin Pen Needle 31G X 8 MM MISC Use with pen QD 100 each 3  . pseudoephedrine (SUDAFED) 60 MG tablet Take 1 tablet (60 mg total) by mouth 2 (two) times daily as needed for congestion. 30 tablet 0   No current facility-administered medications on file prior to visit.    No Known Allergies Social History   Socioeconomic History  . Marital status: Divorced    Spouse name: Not on file  . Number of children: Not on file  . Years of education: Not on file  . Highest education level: Not on file  Social Needs  . Financial resource strain: Not on  file  . Food insecurity - worry: Not on file  . Food insecurity - inability: Not on file  . Transportation needs - medical: Not on file  . Transportation needs - non-medical: Not on file  Occupational History  . Not on file  Tobacco Use  . Smoking status: Never Smoker  . Smokeless tobacco: Never Used  Substance and Sexual Activity  . Alcohol use: Yes    Alcohol/week: 0.6 oz    Types: 1 Glasses of wine per week    Comment: one glass of wine per week  . Drug use: No  . Sexual activity: Yes    Birth control/protection: Surgical  Other Topics Concern  . Not on file  Social History Narrative  . Not on file     Review of Systems  All other systems reviewed and are negative.      Objective:   Physical Exam  Constitutional: She appears  well-developed and well-nourished. No distress.  HENT:  Right Ear: Tympanic membrane, external ear and ear canal normal.  Left Ear: Tympanic membrane, external ear and ear canal normal.  Nose: Mucosal edema and rhinorrhea present.  Mouth/Throat: Posterior oropharyngeal erythema present. No oropharyngeal exudate.  Eyes: Conjunctivae are normal.  Neck: Neck supple.  Cardiovascular: Normal rate, regular rhythm and normal heart sounds.  Pulmonary/Chest: Effort normal and breath sounds normal. No respiratory distress. She has no wheezes. She has no rales.  Lymphadenopathy:    She has no cervical adenopathy.  Skin: She is not diaphoretic.  Vitals reviewed.         Assessment & Plan:  Sore throat - Plan: STREP GROUP A AG, W/REFLEX TO CULT  Fever, unspecified fever cause - Plan: STREP GROUP A AG, W/REFLEX TO CULT  Patient has a viral upper respiratory infection.  Strep test is negative.  Recommended Tylenol and ibuprofen as needed for fever and body aches.  Recommended Chloraseptic as needed for sore throat.  Recommended Sudafed and/or Zyrtec as needed for nasal congestion.  Symptoms should improve over the next 3-4 days.  Recheck next week if no better or sooner if worse.

## 2017-07-27 LAB — CULTURE, GROUP A STREP
MICRO NUMBER:: 90133569
SPECIMEN QUALITY:: ADEQUATE

## 2017-07-27 LAB — STREP GROUP A AG, W/REFLEX TO CULT: Streptococcus, Group A Screen (Direct): NOT DETECTED

## 2017-08-02 ENCOUNTER — Other Ambulatory Visit: Payer: Self-pay

## 2017-08-02 ENCOUNTER — Encounter: Payer: Self-pay | Admitting: Endocrinology

## 2017-08-02 ENCOUNTER — Ambulatory Visit (INDEPENDENT_AMBULATORY_CARE_PROVIDER_SITE_OTHER): Payer: Medicaid Other | Admitting: Endocrinology

## 2017-08-02 VITALS — BP 132/84 | HR 84 | Ht 61.0 in | Wt 273.4 lb

## 2017-08-02 DIAGNOSIS — R5383 Other fatigue: Secondary | ICD-10-CM | POA: Diagnosis not present

## 2017-08-02 DIAGNOSIS — E1142 Type 2 diabetes mellitus with diabetic polyneuropathy: Secondary | ICD-10-CM | POA: Diagnosis not present

## 2017-08-02 DIAGNOSIS — E1165 Type 2 diabetes mellitus with hyperglycemia: Secondary | ICD-10-CM | POA: Diagnosis not present

## 2017-08-02 DIAGNOSIS — E782 Mixed hyperlipidemia: Secondary | ICD-10-CM | POA: Diagnosis not present

## 2017-08-02 DIAGNOSIS — Z794 Long term (current) use of insulin: Secondary | ICD-10-CM

## 2017-08-02 LAB — POCT GLYCOSYLATED HEMOGLOBIN (HGB A1C): Hemoglobin A1C: 9.2

## 2017-08-02 MED ORDER — GLUCOSE BLOOD VI STRP: ORAL_STRIP | 3 refills | Status: DC

## 2017-08-02 MED ORDER — SEMAGLUTIDE(0.25 OR 0.5MG/DOS) 2 MG/1.5ML ~~LOC~~ SOPN: 0.5000 mg | PEN_INJECTOR | SUBCUTANEOUS | 2 refills | Status: DC

## 2017-08-02 MED ORDER — EMPAGLIFLOZIN 10 MG PO TABS: 10.0000 mg | ORAL_TABLET | Freq: Every day | ORAL | 3 refills | Status: DC

## 2017-08-02 NOTE — Patient Instructions (Addendum)
Check blood sugars on waking up  4-5/7  Also check blood sugars about 2 hours after a meal and do this after different meals by rotation  Recommended blood sugar levels on waking up is 90-130 and about 2 hours after meal is 130-180  Please bring your blood sugar monitor to each visit, thank you  Reduce fats and Carbs  Exercise daily  Humalog 20 uunits  Lantus 50 in am and 70 at nite  jardiance every 2 days for 2 weeks then daily in am

## 2017-08-02 NOTE — Progress Notes (Signed)
Patient ID: Kimberly [name], female   DOB: Mar 23, 1971, 47 y.o.   MRN: [id number]          Reason for Appointment: Consultation for Type 2 Diabetes  Referring physician: Lynnea Ferrier   History of Present Illness:          Date of diagnosis of type 2 diabetes mellitus: 12/2014   Background history:   She was initially tried on metformin but because of side effects as was stopped and she was tried on glipizide and subsequently [place].  She apparently took Jardiance for at least a couple of months but because of recurrent yeast infection she stopped it and she did not think it was helping her glucose also He was started on Lantus insulin in 12/2015 Subsequently Bydureon was added in 03/2016  Recent history:   INSULIN regimen is:   Lantus insulin  50 bid Humalog insulin 15 units ac    Non-insulin hypoglycemic drugs the patient is taking are: Bydureon 2 mg weekly  Current management, blood sugar patterns and problems identified:    she has had persistently poorly controlled diabetes and her insulin doses have been increased for this reason  Her Lantus was increased from the starting dose of 18 units in October 2018 up to 15 units twice a day starting in early January   Also because of higher sugars she was also started on Humalog insulin on 06/21/17 and she is taking 15 units without any adjustment at mealtimes  However she has difficulty losing weight and has gained 6 pounds and starting Humalog  She has difficulty controlling her diet especially at night when she is not able to sleep and may get more snacks  Also has not done any exercise except a little walking  She did not bring her monitor for download since she is using a Generic monitor and is not keeping a record also  Today her fasting blood sugar was higher than usual at 366  She continues to feel thirsty and has excessive urination and fatigue        Side effects from medications have been: Nausea and diarrhea  from metformin  Glucose monitoring:  done?  2 place times a day         Glucometer:  [employer] brand .      Blood Glucose readings by time of day and averages from History   PREMEAL Breakfast Lunch Dinner Bedtime  Overall   Glucose range: 240-366    240-400+    Median:        Self-care:  Typical meal intake: Breakfast is   bagel, scrambled egg/oatmeal, juice    Lunches sandwich or leftover Dinner is variable, sometimes only cereal Snacks will be yogurt or chips            Dietician visit, most recent: Never               Exercise:  only swimming in summer  Weight history:  Wt Readings from Last 3 Encounters:  08/02/17 273 lb 6.4 oz (124 kg)  07/25/17 269 lb (122 kg)  06/21/17 267 lb (121.1 kg)    `Glycemic control:   Lab Results  Component Value Date   HGBA1C 10.9 (H) 01/10/2017   HGBA1C 6.8 (H) 07/27/2016   HGBA1C 9.5 (H) 03/23/2016   Lab Results  Component Value Date   MICROALBUR 0.3 03/23/2016   LDLCALC 250 (H) 10/28/2015   CREATININE 1.01 01/10/2017   No results found for: MICRALBCREAT  No results  found for: FRUCTOSAMINE    Allergies as of 08/02/2017   No Known Allergies     Medication List        Accurate as of 08/02/17  1:54 PM. Always use your most recent med list.          acetaZOLAMIDE 250 MG tablet Commonly known as:  DIAMOX TAKE 1 TABLET (250 MG TOTAL) BY MOUTH 2 (TWO) TIMES DAILY.   ALPRAZolam 1 MG tablet Commonly known as:  XANAX TAKE 1 TABLET BY MOUTH 3 TIMES A DAY AS NEEDED   atenolol 100 MG tablet Commonly known as:  TENORMIN TAKE ONE TABLET BY MOUTH ONCE DAILY   buPROPion 150 MG 12 hr tablet Commonly known as:  WELLBUTRIN SR Take 1 tablet (150 mg total) by mouth daily.   cetirizine 10 MG tablet Commonly known as:  ZYRTEC Take 1 tablet (10 mg total) by mouth daily.   empagliflozin 10 MG Tabs tablet Commonly known as:  JARDIANCE Take 10 mg by mouth daily with breakfast.   escitalopram 10 MG tablet Commonly known as:   LEXAPRO TAKE 1 TABLET (10 MG TOTAL) BY MOUTH DAILY.   Exenatide ER 2 MG Pen Inject 2 mg into the skin once a week.   BYDUREON 2 MG Pen Generic drug:  Exenatide ER INJECT 2 MG INTO THE SKIN ONCE A WEEK.   glucose blood test strip Commonly known as:  ACCU-CHEK GUIDE Use as instructed to check blood sugars 3 times daily. Dx Code E11.65   Insulin Glargine 100 UNIT/ML Solostar Pen Commonly known as:  LANTUS SOLOSTAR Inject 50 Units into the skin 2 (two) times daily. 50 units bid   insulin lispro 100 UNIT/ML KiwkPen Commonly known as:  HUMALOG KWIKPEN Inject 0.15 mLs (15 Units total) into the skin 3 (three) times daily.   Insulin Pen Needle 31G X 8 MM Misc Use with pen QD   pseudoephedrine 60 MG tablet Commonly known as:  SUDAFED Take 1 tablet (60 mg total) by mouth 2 (two) times daily as needed for congestion.   Semaglutide 0.25 or 0.5 MG/DOSE Sopn Commonly known as:  OZEMPIC Inject 0.5 mg into the skin once a week.       Allergies: No Known Allergies  Past Medical History:  Diagnosis Date  . Abnormal vaginal Pap smear    tx with cryotherapy  . Anxiety   . Depression   . Diabetes mellitus without complication (HCC)   . History of kidney stones    passed stone - no surgery  . Hypertension   . Migraine    otc med prn  . Obesity   . Pseudotumor cerebri   . SVD (spontaneous vaginal delivery)    x 3  . Tension headache     Past Surgical History:  Procedure Laterality Date  . ABDOMINAL HYSTERECTOMY    . ANTERIOR CERVICAL DECOMP/DISCECTOMY FUSION N/A 01/03/2016   Procedure: ANTERIOR CERVICAL DECOMPRESSION FUSION CERVICAL FIVE-SIX,CERVICAL SIX-SEVEN.;  Surgeon: Lisbeth Renshaw, MD;  Location: [facility];  Service: Neurosurgery;  Laterality: N/A;  right side approach  . CRYOTHERAPY    . LAPAROSCOPIC ASSISTED VAGINAL HYSTERECTOMY Bilateral 04/21/2014   Procedure: LAPAROSCOPIC ASSISTED VAGINAL HYSTERECTOMY, BILATERAL SALPINGECTOMY;  Surgeon: Lavina Hamman, MD;   Location: [facility];  Service: Gynecology;  Laterality: Bilateral;  . MANDIBLE FRACTURE SURGERY  2002   x 1  . TUBAL LIGATION  11/2003  . WISDOM TOOTH EXTRACTION      Family History  Problem Relation Age of Onset  . Hypertension  Mother   . Diabetes Mother   . Diabetes Sister   . Hypertension Sister     Social History:  reports that  has never smoked. she has never used smokeless tobacco. She reports that she drinks about 0.6 oz of alcohol per week. She reports that she does not use drugs.   Review of Systems  Constitutional: Positive for malaise. Negative for weight loss.  HENT: Negative for headaches.   Eyes: Positive for blurred vision.  Respiratory: Positive for daytime sleepiness.   Cardiovascular: Positive for leg swelling.  Gastrointestinal: Negative for nausea.  Endocrine: Positive for fatigue and polydipsia.  Genitourinary: Positive for nocturia.  Musculoskeletal:       Knees  Skin: Negative for rash.  Neurological: Positive for numbness.       Has some feeling of numbness in the left top of the toe and occasional sharp pains.  Also has numbness in her left hand related to her cervical disc problems  Psychiatric/Behavioral: Positive for insomnia.Negative for depressed mood.       Depression controlled on Lexapro which she has taken for 4 years   Lab Results  Component Value Date   TSH 1.383 08/28/2013     Lipid history: Not on treatment, she may have had an increased ALT when she was on Lipitor 80 mg and has not been tried on any other statin    Lab Results  Component Value Date   CHOL 340 (H) 10/28/2015   HDL 28 (L) 10/28/2015   LDLCALC 250 (H) 10/28/2015   LDLDIRECT 122 07/27/2016   TRIG 310 (H) 10/28/2015   CHOLHDL 12.1 (H) 10/28/2015           Lab Results  Component Value Date   ALT 125 (H) 01/10/2017   Hypertension: This is being treated with atenolol 100 mg only  BP Readings from Last 3 Encounters:  08/02/17 132/84  07/25/17 138/80  06/21/17  134/78    Most recent eye exam was in 2017  Most recent foot exam:    LABS:  No visits with results within 1 Week(s) from this visit.  Latest known visit with results is:  Office Visit on 07/25/2017  Component Date Value Ref Range Status  . Streptococcus, Group A Screen (Dir* 07/25/2017 NONE DETECTED   Final  . MICRO NUMBER: 07/25/2017 [id number]   Final  . SPECIMEN QUALITY: 07/25/2017 ADEQUATE   Final  . SOURCE: 07/25/2017 THROAT   Final  . STATUS: 07/25/2017 FINAL   Final  . RESULT: 07/25/2017 No group A Streptococcus isolated   Final    Physical Examination:  BP 132/84 (BP Location: Left Arm, Patient Position: Sitting, Cuff Size: Large)   Pulse 84   Ht 5\' 1"  (1.549 m)   Wt 273 lb 6.4 oz (124 kg)   LMP 09/23/2013   SpO2 97%   BMI 51.66 kg/m   GENERAL:         Patient has marked generalized obesity.    HEENT:         Eye exam shows normal external appearance.  Fundus exam shows no retinopathy.  Oral exam shows normal mucosa .  NECK:   There is no lymphadenopathy Thyroid is not enlarged and no nodules felt.  Carotids are normal to palpation and no bruit heard  LUNGS:         Chest is symmetrical. Lungs are clear to auscultation.Marland Kitchen   HEART:         Heart sounds:  S1 and S2 are normal.  No murmur or click heard., no S3 or S4.   ABDOMEN:   There is no distention present. Liver and spleen are not palpable. No other mass or tenderness present.    NEUROLOGICAL:   Ankle jerks are absent bilaterally.  Biceps reflexes are normal  Diabetic Foot Exam - Simple   Simple Foot Form Diabetic Foot exam was performed with the following findings:  Yes   Visual Inspection No deformities, no ulcerations, no other skin breakdown bilaterally:  Yes Sensation Testing See comments:  Yes Pulse Check Posterior Tibialis and Dorsalis pulse intact bilaterally:  Yes Comments  she has relatively decreased monofilament sensation on the left distal toes            Vibration sense is mildly  reduced in distal first toes, somewhat more on the left.  MUSCULOSKELETAL:  There is no swelling or deformity of the peripheral joints.     EXTREMITIES:     There is no pitting edema.  SKIN:       No rash or lesions of concern.        ASSESSMENT:  Diabetes type 2, uncontrolled with morbid obesity     She has had persistently high A1c since her diagnosis in 2016 has not responded to various therapies including Bydureon and now basal bolus insulin regimen also She reportedly had frequent candidiasis from Jardiance 25 mg previously  She also has difficulty with controlling her diet especially with her insomnia Does not exercise and has minimal knowledge of her diabetes overall Is using a generic monitor and no blood sugar pattern can be determined but she seems to have both high fasting and postprandial readings persistently   Complications of diabetes:?  Neuropathy  HYPERLIPIDEMIA: She may have familial hyperlipidemia with marked increase in LDL Not clear if she had increased ALT from Lipitor and needs to be on a different statin drug; however repeat ALT needs to be done  ESSENTIAL hypertension: Controlled but she is not on an ACE inhibitor which would be recommended for diabetes  Other medical problems including history of depression, knee pain, left cervical disc disease and possible sleep apnea followed by PCP   PLAN:     She needs consultation with dietitian for meal planning since she does need to significantly improve her diet especially  to cut back on total calories and overnight snacking   She will probably get better benefits on blood sugar control and weight loss with Ozempic compared to Bydureon and this may need to be prior authorized.  However this is not available as she may be able to take Victoza  Discussed dosage regimen with OZEMPIC and Victoza and will start with lower doses initially and build up to the maximum dose on either one  Meanwhile she will increase  her Lantus by 20 units at night  She will start checking blood sugars with an Accu-Chek monitor which can be downloaded  Discussed timing and targets of blood sugar testing at various times including postprandial  For now she will increase her HUMALOG to 20 instead of 15 units and also likely will need 15 units for any large snack at night  Start walking for exercise  Follow-up in 4 weeks  Also she can try a lower dose of JARDIANCE 10 mg every other day for now; she has not had any Candida vaginitis even with severe hyperglycemia recently and should be able to tolerate this now  Discussed also that she may be a candidate for bariatric surgery because  of her marked obesity and diabetes and insulin resistance  Check TSH on next visit, she has fatigue and obesity and has not had this evaluated recently  Patient Instructions  Check blood sugars on waking up  4-5/7  Also check blood sugars about 2 hours after a meal and do this after different meals by rotation  Recommended blood sugar levels on waking up is 90-130 and about 2 hours after meal is 130-180  Please bring your blood sugar monitor to each visit, thank you  Reduce fats and Carbs  Exercise daily  Humalog 20 uunits  Lantus 50 in am and 70 at nite  jardiance every 2 days for 2 weeks then daily in am        Consultation note has been sent to the referring physician  Counseling time on subjects discussed in assessment and plan sections is over 50% of today's 60 minute visit  Reather Littler 08/02/2017, 1:54 PM   Note: This office note was prepared with Dragon voice recognition system technology. Any transcriptional errors that result from this process are unintentional.

## 2017-08-05 ENCOUNTER — Other Ambulatory Visit: Payer: Self-pay

## 2017-08-05 MED ORDER — GLUCOSE BLOOD VI STRP: ORAL_STRIP | 3 refills | Status: DC

## 2017-08-05 MED ORDER — ONETOUCH VERIO W/DEVICE KIT: 1.0000 | PACK | Freq: Once | 0 refills | Status: AC

## 2017-08-05 MED ORDER — ONETOUCH LANCETS MISC: 3 refills | Status: DC

## 2017-08-06 ENCOUNTER — Telehealth: Payer: Self-pay

## 2017-08-06 NOTE — Telephone Encounter (Signed)
Called and infromed pt that Ozempic is not covered per her insurance and she will have to call to see what they will pay for. Pt is aware and will call back with the needed information

## 2017-08-08 ENCOUNTER — Telehealth: Payer: Self-pay | Admitting: Endocrinology

## 2017-08-08 MED ORDER — LIRAGLUTIDE 18 MG/3ML ~~LOC~~ SOPN: PEN_INJECTOR | SUBCUTANEOUS | 1 refills | Status: DC

## 2017-08-08 NOTE — Telephone Encounter (Signed)
Will need to do a PA for Jardiance, she will continue Bydureon that she is on

## 2017-08-08 NOTE — Telephone Encounter (Signed)
She has sent a fax about Ozempic and Jardiance not being covered. However Medicaid list indicates [place] [facility] is covered and need to check with pharmacy about coverage Instead of Ozempic and Bydureon she will start Victoza 0.6 mg daily for the first 3 days, 1.2 mg for the next 3 days and then 1.8 mg daily.  Prescription will be for 3 pens per month

## 2017-08-08 NOTE — Telephone Encounter (Signed)
Per the pharmacy [place] [facility] is not going through and is stating PA is needed. Victoza was sent but pharmacy [facility]. Please advise on Jardiance medication switch vs PA and medication switch from Victoza to Bydureon.

## 2017-08-09 ENCOUNTER — Other Ambulatory Visit: Payer: Self-pay | Admitting: Family Medicine

## 2017-08-09 MED ORDER — EXENATIDE ER 2 MG ~~LOC~~ PEN: PEN_INJECTOR | SUBCUTANEOUS | 2 refills | Status: DC

## 2017-08-09 MED ORDER — FLUTICASONE PROPIONATE 50 MCG/ACT NA SUSP: 2.0000 | Freq: Every day | NASAL | 6 refills | Status: DC

## 2017-08-09 NOTE — Addendum Note (Signed)
Addended by: Yolande JollyLAWSON, Jermell Holeman on: 08/09/2017 08:16 AM   Modules accepted: Orders

## 2017-08-09 NOTE — Telephone Encounter (Signed)
Pt is aware and will call her insurance today to iniate the PA today

## 2017-08-12 ENCOUNTER — Telehealth: Payer: Self-pay | Admitting: Endocrinology

## 2017-08-12 NOTE — Telephone Encounter (Signed)
Pt stated she was in on the 8th. Dr prescribed her new medications   empagliflozin (JARDIANCE) 10 MG TABS tablet  And Ozempic.   Pt states that she faxed over a paper for dr stating what her insurance would cover. She is following up on paper and see if we have sent over a new prescription   Please advise

## 2017-08-13 NOTE — Telephone Encounter (Signed)
Have you seen this paper?  Please advise.

## 2017-08-13 NOTE — Telephone Encounter (Signed)
PA  For Jardiance was faxed on 08/09/17

## 2017-08-15 ENCOUNTER — Other Ambulatory Visit: Payer: Self-pay

## 2017-08-15 MED ORDER — EXENATIDE ER 2 MG ~~LOC~~ PEN: PEN_INJECTOR | SUBCUTANEOUS | 2 refills | Status: DC

## 2017-08-16 ENCOUNTER — Telehealth: Payer: Self-pay

## 2017-08-16 NOTE — Telephone Encounter (Signed)
Pt called to discuss her medications, went over that she is staying on Bydueron and we are pending the PA for Jardiance. She stated she understood and had no more questions at this time

## 2017-08-23 ENCOUNTER — Ambulatory Visit: Payer: Medicaid Other | Admitting: Dietician

## 2017-08-27 ENCOUNTER — Telehealth: Payer: Self-pay | Admitting: Endocrinology

## 2017-08-27 NOTE — Telephone Encounter (Signed)
[facility] called stated patient need a PA on medication Jardiance. [phone]

## 2017-09-02 ENCOUNTER — Other Ambulatory Visit (INDEPENDENT_AMBULATORY_CARE_PROVIDER_SITE_OTHER): Payer: Medicaid Other

## 2017-09-02 DIAGNOSIS — E1165 Type 2 diabetes mellitus with hyperglycemia: Secondary | ICD-10-CM | POA: Diagnosis not present

## 2017-09-02 DIAGNOSIS — Z794 Long term (current) use of insulin: Secondary | ICD-10-CM

## 2017-09-02 LAB — COMPREHENSIVE METABOLIC PANEL
ALT: 54 U/L — ABNORMAL HIGH (ref 0–35)
AST: 26 U/L (ref 0–37)
Albumin: 3.9 g/dL (ref 3.5–5.2)
Alkaline Phosphatase: 105 U/L (ref 39–117)
BUN: 23 mg/dL (ref 6–23)
CO2: 24 mEq/L (ref 19–32)
Calcium: 9.5 mg/dL (ref 8.4–10.5)
Chloride: 106 mEq/L (ref 96–112)
Creatinine, Ser: 0.95 mg/dL (ref 0.40–1.20)
GFR: 67.08 mL/min (ref >60.00)
Glucose, Bld: 296 mg/dL — ABNORMAL HIGH (ref 70–99)
Potassium: 4.1 mEq/L (ref 3.5–5.1)
Sodium: 137 mEq/L (ref 135–145)
Total Bilirubin: 0.5 mg/dL (ref 0.2–1.2)
Total Protein: 6.8 g/dL (ref 6.0–8.3)

## 2017-09-03 LAB — FRUCTOSAMINE: Fructosamine: 266 umol/L (ref 0–285)

## 2017-09-03 LAB — TSH: TSH: 3.33 u[IU]/mL (ref 0.35–4.50)

## 2017-09-03 NOTE — Progress Notes (Deleted)
Patient ID: Kimberly [name], female   DOB: 06-Dec-1970, 47 y.o.   MRN: [id number]          Reason for Appointment: Consultation for Type 2 Diabetes  Referring physician: Lynnea Ferrier   History of Present Illness:          Date of diagnosis of type 2 diabetes mellitus: 12/2014   Background history:   She was initially tried on metformin but because of side effects as was stopped and she was tried on glipizide and subsequently [place].  She apparently took Jardiance for at least a couple of months but because of recurrent yeast infection she stopped it and she did not think it was helping her glucose also He was started on Lantus insulin in 12/2015 Subsequently Bydureon was added in 03/2016  Recent history:   INSULIN regimen is:   Lantus insulin  50 bid Humalog insulin 15 units ac    Non-insulin hypoglycemic drugs the patient is taking are: Bydureon 2 mg weekly  Current management, blood sugar patterns and problems identified:    she has had persistently poorly controlled diabetes and her insulin doses have been increased for this reason  Her Lantus was increased from the starting dose of 18 units in October 2018 up to 15 units twice a day starting in early January   Also because of higher sugars she was also started on Humalog insulin on 06/21/17 and she is taking 15 units without any adjustment at mealtimes  However she has difficulty losing weight and has gained 6 pounds and starting Humalog  She has difficulty controlling her diet especially at night when she is not able to sleep and may get more snacks  Also has not done any exercise except a little walking  She did not bring her monitor for download since she is using a Generic monitor and is not keeping a record also  Today her fasting blood sugar was higher than usual at 366  She continues to feel thirsty and has excessive urination and fatigue        Side effects from medications have been: Nausea and diarrhea  from metformin  Glucose monitoring:  done?  2 place times a day         Glucometer:  [facility] .      Blood Glucose readings by time of day and averages from History   PREMEAL Breakfast Lunch Dinner Bedtime  Overall   Glucose range: 240-366    240-400+    Median:        Self-care:  Typical meal intake: Breakfast is   bagel, scrambled egg/oatmeal, juice    Lunches sandwich or leftover Dinner is variable, sometimes only cereal Snacks will be yogurt or chips            Dietician visit, most recent: Never               Exercise:  only swimming in summer  Weight history:  Wt Readings from Last 3 Encounters:  08/02/17 273 lb 6.4 oz (124 kg)  07/25/17 269 lb (122 kg)  06/21/17 267 lb (121.1 kg)    `Glycemic control:   Lab Results  Component Value Date   HGBA1C 9.2 08/02/2017   HGBA1C 10.9 (H) 01/10/2017   HGBA1C 6.8 (H) 07/27/2016   Lab Results  Component Value Date   MICROALBUR 0.3 03/23/2016   LDLCALC 250 (H) 10/28/2015   CREATININE 0.95 09/02/2017   No results found for: [facility]  Lab Results  Component Value Date   FRUCTOSAMINE 266 09/02/2017      Allergies as of 09/04/2017   No Known Allergies     Medication List        Accurate as of 09/03/17  9:29 PM. Always use your most recent med list.          acetaZOLAMIDE 250 MG tablet Commonly known as:  DIAMOX TAKE 1 TABLET (250 MG TOTAL) BY MOUTH 2 (TWO) TIMES DAILY.   ALPRAZolam 1 MG tablet Commonly known as:  XANAX TAKE 1 TABLET BY MOUTH 3 TIMES A DAY AS NEEDED   atenolol 100 MG tablet Commonly known as:  TENORMIN TAKE ONE TABLET BY MOUTH ONCE DAILY   buPROPion 150 MG 12 hr tablet Commonly known as:  WELLBUTRIN SR Take 1 tablet (150 mg total) by mouth daily.   cetirizine 10 MG tablet Commonly known as:  ZYRTEC Take 1 tablet (10 mg total) by mouth daily.   empagliflozin 10 MG Tabs tablet Commonly known as:  JARDIANCE Take 10 mg by mouth daily with breakfast.   escitalopram 10 MG  tablet Commonly known as:  LEXAPRO TAKE 1 TABLET (10 MG TOTAL) BY MOUTH DAILY.   Exenatide ER 2 MG Pen Inject 2 mg into the skin once a week.   Exenatide ER 2 MG Pen Commonly known as:  BYDUREON INJECT 2 MG INTO THE SKIN ONCE A WEEK.   fluticasone 50 MCG/ACT nasal spray Commonly known as:  FLONASE Place 2 sprays into both nostrils daily.   glucose blood test strip Commonly known as:  ONETOUCH VERIO Use as instructed to check blood sugars 3 times daily DX Code E11.65   Insulin Glargine 100 UNIT/ML Solostar Pen Commonly known as:  LANTUS SOLOSTAR Inject 50 Units into the skin 2 (two) times daily. 50 units bid   insulin lispro 100 UNIT/ML KiwkPen Commonly known as:  HUMALOG KWIKPEN Inject 0.15 mLs (15 Units total) into the skin 3 (three) times daily.   Insulin Pen Needle 31G X 8 MM Misc Use with pen QD   ONE TOUCH LANCETS Misc Use as instructed to check blood sugars 3 times daily. Dx Code E11.65   pseudoephedrine 60 MG tablet Commonly known as:  SUDAFED Take 1 tablet (60 mg total) by mouth 2 (two) times daily as needed for congestion.       Allergies: No Known Allergies  Past Medical History:  Diagnosis Date  . Abnormal vaginal Pap smear    tx with cryotherapy  . Anxiety   . Depression   . Diabetes mellitus without complication (HCC)   . History of kidney stones    passed stone - no surgery  . Hypertension   . Migraine    otc med prn  . Obesity   . Pseudotumor cerebri   . SVD (spontaneous vaginal delivery)    x 3  . Tension headache     Past Surgical History:  Procedure Laterality Date  . ABDOMINAL HYSTERECTOMY    . ANTERIOR CERVICAL DECOMP/DISCECTOMY FUSION N/A 01/03/2016   Procedure: ANTERIOR CERVICAL DECOMPRESSION FUSION CERVICAL FIVE-SIX,CERVICAL SIX-SEVEN.;  Surgeon: Lisbeth Renshaw, MD;  Location: [facility];  Service: Neurosurgery;  Laterality: N/A;  right side approach  . CRYOTHERAPY    . LAPAROSCOPIC ASSISTED VAGINAL HYSTERECTOMY Bilateral  04/21/2014   Procedure: LAPAROSCOPIC ASSISTED VAGINAL HYSTERECTOMY, BILATERAL SALPINGECTOMY;  Surgeon: Lavina Hamman, MD;  Location: [facility];  Service: Gynecology;  Laterality: Bilateral;  . MANDIBLE FRACTURE SURGERY  2002   x 1  . TUBAL  LIGATION  11/2003  . WISDOM TOOTH EXTRACTION      Family History  Problem Relation Age of Onset  . Hypertension Mother   . Diabetes Mother   . Diabetes Sister   . Hypertension Sister     Social History:  reports that  has never smoked. she has never used smokeless tobacco. She reports that she drinks about 0.6 oz of alcohol per week. She reports that she does not use drugs.   Review of Systems  Constitutional: Positive for malaise. Negative for weight loss.  HENT: Negative for headaches.   Eyes: Positive for blurred vision.  Respiratory: Positive for daytime sleepiness.   Cardiovascular: Positive for leg swelling.  Gastrointestinal: Negative for nausea.  Endocrine: Positive for fatigue and polydipsia.  Genitourinary: Positive for nocturia.  Musculoskeletal:       Knees  Skin: Negative for rash.  Neurological: Positive for numbness.       Has some feeling of numbness in the left top of the toe and occasional sharp pains.  Also has numbness in her left hand related to her cervical disc problems  Psychiatric/Behavioral: Positive for insomnia.Negative for depressed mood.       Depression controlled on Lexapro which she has taken for 4 years   Lab Results  Component Value Date   TSH 3.33 09/02/2017     Lipid history: Not on treatment, she may have had an increased ALT when she was on Lipitor 80 mg and has not been tried on any other statin    Lab Results  Component Value Date   CHOL 340 (H) 10/28/2015   HDL 28 (L) 10/28/2015   LDLCALC 250 (H) 10/28/2015   LDLDIRECT 122 07/27/2016   TRIG 310 (H) 10/28/2015   CHOLHDL 12.1 (H) 10/28/2015           Lab Results  Component Value Date   ALT 54 (H) 09/02/2017   Hypertension: This is being  treated with atenolol 100 mg only  BP Readings from Last 3 Encounters:  08/02/17 132/84  07/25/17 138/80  06/21/17 134/78    Most recent eye exam was in 2017  Most recent foot exam:    LABS:  Lab on 09/02/2017  Component Date Value Ref Range Status  . TSH 09/02/2017 3.33  0.35 - 4.50 uIU/mL Final  . Sodium 09/02/2017 137  135 - 145 mEq/L Final  . Potassium 09/02/2017 4.1  3.5 - 5.1 mEq/L Final  . Chloride 09/02/2017 106  96 - 112 mEq/L Final  . CO2 09/02/2017 24  19 - 32 mEq/L Final  . Glucose, Bld 09/02/2017 296* 70 - 99 mg/dL Final  . BUN 16/03/9603 23  6 - 23 mg/dL Final  . Creatinine, Ser 09/02/2017 0.95  0.40 - 1.20 mg/dL Final  . Total Bilirubin 09/02/2017 0.5  0.2 - 1.2 mg/dL Final  . Alkaline Phosphatase 09/02/2017 105  39 - 117 U/L Final  . AST 09/02/2017 26  0 - 37 U/L Final  . ALT 09/02/2017 54* 0 - 35 U/L Final  . Total Protein 09/02/2017 6.8  6.0 - 8.3 g/dL Final  . Albumin 54/02/8118 3.9  3.5 - 5.2 g/dL Final  . Calcium 14/78/2956 9.5  8.4 - 10.5 mg/dL Final  . GFR 21/30/8657 67.08  >60.00 mL/min Final  . Fructosamine 09/02/2017 266  0 - 285 umol/L Final   Comment: Published reference interval for apparently healthy subjects between age 55 and 53 is 51 - 285 umol/L and in a poorly controlled diabetic population  is 228 - 563 umol/L with a mean of 396 umol/L.     Physical Examination:  LMP 09/23/2013   GENERAL:         Patient has marked generalized obesity.    HEENT:         Eye exam shows normal external appearance.  Fundus exam shows no retinopathy.  Oral exam shows normal mucosa .  NECK:   There is no lymphadenopathy Thyroid is not enlarged and no nodules felt.  Carotids are normal to palpation and no bruit heard  LUNGS:         Chest is symmetrical. Lungs are clear to auscultation.Marland Kitchen   HEART:         Heart sounds:  S1 and S2 are normal. No murmur or click heard., no S3 or S4.   ABDOMEN:   There is no distention present. Liver and spleen are not  palpable. No other mass or tenderness present.    NEUROLOGICAL:   Ankle jerks are absent bilaterally.  Biceps reflexes are normal  Diabetic Foot Exam - Simple   No data filed             Vibration sense is mildly reduced in distal first toes, somewhat more on the left.  MUSCULOSKELETAL:  There is no swelling or deformity of the peripheral joints.     EXTREMITIES:     There is no pitting edema.  SKIN:       No rash or lesions of concern.        ASSESSMENT:  Diabetes type 2, uncontrolled with morbid obesity     She has had persistently high A1c since her diagnosis in 2016 has not responded to various therapies including Bydureon and now basal bolus insulin regimen also She reportedly had frequent candidiasis from Jardiance 25 mg previously  She also has difficulty with controlling her diet especially with her insomnia Does not exercise and has minimal knowledge of her diabetes overall Is using a generic monitor and no blood sugar pattern can be determined but she seems to have both high fasting and postprandial readings persistently   Complications of diabetes:?  Neuropathy  HYPERLIPIDEMIA: She may have familial hyperlipidemia with marked increase in LDL Not clear if she had increased ALT from Lipitor and needs to be on a different statin drug; however repeat ALT needs to be done  ESSENTIAL hypertension: Controlled but she is not on an ACE inhibitor which would be recommended for diabetes  Other medical problems including history of depression, knee pain, left cervical disc disease and possible sleep apnea followed by PCP   PLAN:     She needs consultation with dietitian for meal planning since she does need to significantly improve her diet especially  to cut back on total calories and overnight snacking   She will probably get better benefits on blood sugar control and weight loss with Ozempic compared to Bydureon and this may need to be prior authorized.  However this is  not available as she may be able to take Victoza  Discussed dosage regimen with OZEMPIC and Victoza and will start with lower doses initially and build up to the maximum dose on either one  Meanwhile she will increase her Lantus by 20 units at night  She will start checking blood sugars with an Accu-Chek monitor which can be downloaded  Discussed timing and targets of blood sugar testing at various times including postprandial  For now she will increase her HUMALOG to 20 instead of  15 units and also likely will need 15 units for any large snack at night  Start walking for exercise  Follow-up in 4 weeks  Also she can try a lower dose of JARDIANCE 10 mg every other day for now; she has not had any Candida vaginitis even with severe hyperglycemia recently and should be able to tolerate this now  Discussed also that she may be a candidate for bariatric surgery because of her marked obesity and diabetes and insulin resistance  Check TSH on next visit, she has fatigue and obesity and has not had this evaluated recently  There are no Patient Instructions on file for this visit.  Consultation note has been sent to the referring physician  Counseling time on subjects discussed in assessment and plan sections is over 50% of today's 60 minute visit  Reather Littler 09/03/2017, 9:29 PM   Note: This office note was prepared with Dragon voice recognition system technology. Any transcriptional errors that result from this process are unintentional.

## 2017-09-04 ENCOUNTER — Ambulatory Visit: Payer: Medicaid Other | Admitting: Endocrinology

## 2017-09-05 NOTE — Telephone Encounter (Signed)
I called [facility] and Jardiance PA is approved.

## 2017-09-12 ENCOUNTER — Encounter: Payer: Medicaid Other | Attending: Endocrinology | Admitting: Dietician

## 2017-09-12 ENCOUNTER — Encounter: Payer: Self-pay | Admitting: Dietician

## 2017-09-12 DIAGNOSIS — Z713 Dietary counseling and surveillance: Secondary | ICD-10-CM | POA: Diagnosis not present

## 2017-09-12 DIAGNOSIS — E1165 Type 2 diabetes mellitus with hyperglycemia: Secondary | ICD-10-CM | POA: Insufficient documentation

## 2017-09-12 DIAGNOSIS — E118 Type 2 diabetes mellitus with unspecified complications: Secondary | ICD-10-CM

## 2017-09-12 DIAGNOSIS — Z794 Long term (current) use of insulin: Secondary | ICD-10-CM

## 2017-09-12 NOTE — Patient Instructions (Signed)
Get your sleep study done Consider getting your vitamin D level checked or taking 2,000 units vitamin D3 per day  Include a small amount of carbohydrate with each meal. Less processed is best.  Choose whole grain. Increase your intake of non-starchy vegetables Great job on the changes that you have made!  Drinking water rather than sugar containing beverages.  Walking  Fewer carbohydrates  Being more mindful about stress eating Eat 3 meals per day.  See My Plate.  Eat slowly and stop when you satisfied.  Use a to go box when eating out. Before a snack ask if you are hungry.  If you are not then what can you do instead? Avoid added fat and choose lean.  Aim for 2-3 Carb Choices per meal (30-45 grams) +/- 1 either way  Aim for 0-1 Carbs per snack if hungry  Include protein in moderation with your meals and snacks Consider reading food labels for Total Carbohydrate and Fat Grams of foods Consider  increasing your activity level by walking for 30-50 minutes daily as tolerated Continue checking BG at alternate times per day as directed by MD  Consider taking medication as directed by MD

## 2017-09-12 NOTE — Progress Notes (Signed)
Diabetes Self-Management Education  Visit Type: First/Initial  Appt. Start Time: 0900 Appt. End Time: 1030  09/12/2017  [name], identified by name and date of birth, is a 47 y.o. female with a diagnosis of Diabetes: Type 2.  Other history includes depression, anxiety, HTN. Medications include:  Vania Rea (has not yet started this), Lantus 50 units bid, Humalog 18 units before each meal, Bydureon. Kimberly [name] had Yeast infections on Jardiance in the past but this is to be started at a lower dose.  (See comments below regarding medication.)  Kimberly [name] stated that her meter was not functioning well. Accu-Chek Guide provedid with expriation of 08/26/18, lot [id number].  Request for strips and drums sent to medical assistant. Kimberly [name] continues to gain weight.  Concerns that some of this is fluid although Kimberly [name] has been doing a lot of stress eating.  Kimberly [name] is a single mother of three ages 33, 85, and 73.  Her ex has the children every other weekend.  Kimberly [name] has support from a good friend.  Kimberly [name] does not have a good relationship with her mother and her father is deceased.  Kimberly [name] has no other support.  For the past year Kimberly [name] has been under extreme stress related to a court issue.  Kimberly [name] has to spend 2 more weekends in jail.  Kimberly [name] reports that they are not allowing her to take the insulin or other medications and do not treat low blood sugars despite MD letter. Kimberly [name] states that Kimberly [name] is fed poorly (4 slices of bread, milk, cheese). 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  Kimberly [name] states that it is a very stressful work environment as well.    ASSESSMENT  Height _0  (1.549 m), weight 277 lb (125.6 kg), last menstrual period 09/23/2013. Body mass index is 52.34 kg/m.  Diabetes Self-Management Education - 09/12/17 0930      Visit Information   Visit Type  First/Initial      Initial Visit   Diabetes Type  Type 2    Are you currently following a meal plan?  No    Are you taking your medications as prescribed?  Yes 75% of the time    Date  Diagnosed  12/2014      Health Coping   How would you rate your overall health?  Poor      Psychosocial Assessment   Kimberly [name] Belief/Attitude about Diabetes  Defeat/Burnout    Self-care barriers  Lack of material resources;Other (comment) single parent, increased stress    Self-management support  Doctor's office    Other persons present  Kimberly [name]    Kimberly [name] Concerns  Nutrition/Meal planning;Weight Control;Glycemic Control;Problem Solving;Monitoring    Special Needs  None    Learning Readiness  Ready    How often do you need to have someone help you when you read instructions, pamphlets, or other written materials from your doctor or pharmacy?  1 - Never    What is the last grade level you completed in school?  2 years       Pre-Education Assessment   Kimberly [name] understands the diabetes disease and treatment process.  Needs Instruction    Kimberly [name] understands incorporating nutritional management into lifestyle.  Needs Instruction    Kimberly [name] undertands incorporating physical activity into lifestyle.  Needs Instruction    Kimberly [name] understands using medications safely.  Needs Instruction    Kimberly [name] understands monitoring blood glucose, interpreting and using results  Needs Instruction    Kimberly [name] understands prevention, detection, and treatment of acute complications.  Needs Instruction  Kimberly [name] understands prevention, detection, and treatment of chronic complications.  Needs Instruction    Kimberly [name] understands how to develop strategies to address psychosocial issues.  Needs Instruction    Kimberly [name] understands how to develop strategies to promote health/change behavior.  Needs Instruction      Complications   Last HgB A1C per Kimberly [name]/outside source  9.2 % 08/02/17    How often do you check your blood sugar?  > 4 times/day    Fasting Blood glucose range (mg/dL)  >200 342 this am    Postprandial Blood glucose range (mg/dL)  >200    Number of hypoglycemic episodes per month  1 46 without insulin     Can you tell when your blood sugar is low?  Yes    What do you do if your blood sugar is low?  OJ    Number of hyperglycemic episodes per week  21    Can you tell when your blood sugar is high?  Yes    What do you do if your blood sugar is high?  nothing    Have you had a dilated eye exam in the past 12 months?  No appointment in April 2019    Have you had a dental exam in the past 12 months?  No    Are you checking your feet?  Yes they are swollen and hurt    How many days per week are you checking your feet?  7      Dietary Intake   Breakfast  2 eggs, sliced tomato    Snack (morning)  yogurt (flip)    Lunch  [employer] 3 days per week (grilled chicken, mac and cheese, green beans, salad) OR salad or ham sandwich and guacamole and chips    Snack (afternoon)  none    Dinner  chicken salad, pita chips, grapes OR homemade pizza with BBQ chicken and onion on [employer] crust    Snack (evening)  none      Exercise   Exercise Type  Light (walking / raking leaves)    How many days per week to you exercise?  3    How many minutes per day do you exercise?  50    Total minutes per week of exercise  150      Kimberly [name] Education   Previous Diabetes Education  No    Disease state   Definition of diabetes, type 1 and 2, and the diagnosis of diabetes    Nutrition management   Role of diet in the treatment of diabetes and the relationship between the three main macronutrients and blood glucose level;Food label reading, portion sizes and measuring food.;Carbohydrate counting;Meal options for control of blood glucose level and chronic complications.    Physical activity and exercise   Role of exercise on diabetes management, blood pressure control and cardiac health.    Medications  Reviewed patients medication for diabetes, action, purpose, timing of dose and side effects.    Monitoring  Purpose and frequency of SMBG.;Identified appropriate SMBG and/or A1C goals.;Daily foot exams;Yearly dilated eye exam    Acute  complications  Taught treatment of hypoglycemia - the 15 rule.;Discussed and identified patients' treatment of hyperglycemia.    Chronic complications  Relationship between chronic complications and blood glucose control;Retinopathy and reason for yearly dilated eye exams    Psychosocial adjustment  Worked with Kimberly [name] to identify barriers to care and solutions;Role of stress on diabetes;Helped Kimberly [name] identify a support system for diabetes management;Identified and addressed patients feelings  and concerns about diabetes;Brainstormed with Kimberly [name] on coping mechanisms for social situations, getting support from significant others, dealing with feelings about diabetes    Personal strategies to promote health  Lifestyle issues that need to be addressed for better diabetes care      Individualized Goals (developed by Kimberly [name])   Nutrition  General guidelines for healthy choices and portions discussed    Physical Activity  Exercise 5-7 days per week;30 minutes per day    Medications  take my medication as prescribed    Monitoring   test my blood glucose as discussed    Reducing Risk  examine blood glucose patterns;treat hypoglycemia with 15 grams of carbs if blood glucose less than 78m/dL    Health Coping  discuss diabetes with (comment) MD, RD, CDE      Post-Education Assessment   Kimberly [name] understands the diabetes disease and treatment process.  Demonstrates understanding / competency    Kimberly [name] understands incorporating nutritional management into lifestyle.  Demonstrates understanding / competency    Kimberly [name] undertands incorporating physical activity into lifestyle.  Demonstrates understanding / competency    Kimberly [name] understands using medications safely.  Demonstrates understanding / competency    Kimberly [name] understands monitoring blood glucose, interpreting and using results  Demonstrates understanding / competency    Kimberly [name] understands prevention, detection, and treatment of acute complications.   Demonstrates understanding / competency    Kimberly [name] understands prevention, detection, and treatment of chronic complications.  Demonstrates understanding / competency    Kimberly [name] understands how to develop strategies to address psychosocial issues.  Needs Review    Kimberly [name] understands how to develop strategies to promote health/change behavior.  Needs Review      Outcomes   Expected Outcomes  Demonstrated interest in learning. Expect positive outcomes    Future DMSE  4-6 wks    Program Status  Not Completed       Individualized Plan for Diabetes Self-Management Training:   Learning Objective:  Kimberly [name] will have a greater understanding of diabetes self-management. Kimberly [name] education plan is to attend individual and/or group sessions per assessed needs and concerns.   Plan:   Kimberly [name] Instructions  Get your sleep study done Consider getting your vitamin D level checked or taking 2,000 units vitamin D3 per day  Include a small amount of carbohydrate with each meal. Less processed is best.  Choose whole grain. Increase your intake of non-starchy vegetables Great job on the changes that you have made!  Drinking water rather than sugar containing beverages.  Walking  Fewer carbohydrates  Being more mindful about stress eating Eat 3 meals per day.  See My Plate.  Eat slowly and stop when you satisfied.  Use a to go box when eating out. Before a snack ask if you are hungry.  If you are not then what can you do instead? Avoid added fat and choose lean.  Aim for 2-3 Carb Choices per meal (30-45 grams) +/- 1 either way  Aim for 0-1 Carbs per snack if hungry  Include protein in moderation with your meals and snacks Consider reading food labels for Total Carbohydrate and Fat Grams of foods Consider  increasing your activity level by walking for 30-50 minutes daily as tolerated Continue checking BG at alternate times per day as directed by MD  Consider taking medication as directed by  MD       Expected Outcomes:  Demonstrated interest in learning. Expect positive outcomes  Education material provided: Living Well with Diabetes, Food label handouts, A1C conversion  sheet, Meal plan card, My Plate and Snack sheet, Weight loss  If problems or questions, Kimberly [name] to contact team via:  Phone  Future DSME appointment: 4-6 wks

## 2017-09-22 NOTE — Progress Notes (Deleted)
Patient ID: Kimberly [name], female   DOB: 1970/09/06, 47 y.o.   MRN: [id number]          Reason for Appointment: Consultation for Type 2 Diabetes  Referring physician: Lynnea Ferrier   History of Present Illness:          Date of diagnosis of type 2 diabetes mellitus: 12/2014   Background history:   She was initially tried on metformin but because of side effects as was stopped and she was tried on glipizide and subsequently [place].  She apparently took Jardiance for at least a couple of months but because of recurrent yeast infection she stopped it and she did not think it was helping her glucose also He was started on Lantus insulin in 12/2015 Subsequently Bydureon was added in 03/2016  Recent history:   INSULIN regimen is:   Lantus insulin  50 bid Humalog insulin 15 units ac    Non-insulin hypoglycemic drugs the patient is taking are: Bydureon 2 mg weekly  Current management, blood sugar patterns and problems identified:    she has had persistently poorly controlled diabetes and her insulin doses have been increased for this reason  Her Lantus was increased from the starting dose of 18 units in October 2018 up to 15 units twice a day starting in early January   Also because of higher sugars she was also started on Humalog insulin on 06/21/17 and she is taking 15 units without any adjustment at mealtimes  However she has difficulty losing weight and has gained 6 pounds and starting Humalog  She has difficulty controlling her diet especially at night when she is not able to sleep and may get more snacks  Also has not done any exercise except a little walking  She did not bring her monitor for download since she is using a Generic monitor and is not keeping a record also  Today her fasting blood sugar was higher than usual at 366  She continues to feel thirsty and has excessive urination and fatigue        Side effects from medications have been: Nausea and diarrhea  from metformin  Glucose monitoring:  done?  2 place times a day         Glucometer:  [employer] brand .      Blood Glucose readings by time of day and averages from History   PREMEAL Breakfast Lunch Dinner Bedtime  Overall   Glucose range: 240-366    240-400+    Median:        Self-care:  Typical meal intake: Breakfast is   bagel, scrambled egg/oatmeal, juice    Lunches sandwich or leftover Dinner is variable, sometimes only cereal Snacks will be yogurt or chips            Dietician visit, most recent: Never               Exercise:  only swimming in summer  Weight history:  Wt Readings from Last 3 Encounters:  09/12/17 277 lb (125.6 kg)  08/02/17 273 lb 6.4 oz (124 kg)  07/25/17 269 lb (122 kg)    `Glycemic control:   Lab Results  Component Value Date   HGBA1C 9.2 08/02/2017   HGBA1C 10.9 (H) 01/10/2017   HGBA1C 6.8 (H) 07/27/2016   Lab Results  Component Value Date   MICROALBUR 0.3 03/23/2016   LDLCALC 250 (H) 10/28/2015   CREATININE 0.95 09/02/2017   No results found for: [facility]  Lab Results  Component Value Date   FRUCTOSAMINE 266 09/02/2017      Allergies as of 09/23/2017   No Known Allergies     Medication List        Accurate as of 09/22/17  8:46 PM. Always use your most recent med list.          acetaZOLAMIDE 250 MG tablet Commonly known as:  DIAMOX TAKE 1 TABLET (250 MG TOTAL) BY MOUTH 2 (TWO) TIMES DAILY.   ALPRAZolam 1 MG tablet Commonly known as:  XANAX TAKE 1 TABLET BY MOUTH 3 TIMES A DAY AS NEEDED   atenolol 100 MG tablet Commonly known as:  TENORMIN TAKE ONE TABLET BY MOUTH ONCE DAILY   buPROPion 150 MG 12 hr tablet Commonly known as:  WELLBUTRIN SR Take 1 tablet (150 mg total) by mouth daily.   cetirizine 10 MG tablet Commonly known as:  ZYRTEC Take 1 tablet (10 mg total) by mouth daily.   empagliflozin 10 MG Tabs tablet Commonly known as:  JARDIANCE Take 10 mg by mouth daily with breakfast.   escitalopram 10 MG  tablet Commonly known as:  LEXAPRO TAKE 1 TABLET (10 MG TOTAL) BY MOUTH DAILY.   Exenatide ER 2 MG Pen Inject 2 mg into the skin once a week.   Exenatide ER 2 MG Pen Commonly known as:  BYDUREON INJECT 2 MG INTO THE SKIN ONCE A WEEK.   fluticasone 50 MCG/ACT nasal spray Commonly known as:  FLONASE Place 2 sprays into both nostrils daily.   glucose blood test strip Commonly known as:  ONETOUCH VERIO Use as instructed to check blood sugars 3 times daily DX Code E11.65   Insulin Glargine 100 UNIT/ML Solostar Pen Commonly known as:  LANTUS SOLOSTAR Inject 50 Units into the skin 2 (two) times daily. 50 units bid   insulin lispro 100 UNIT/ML KiwkPen Commonly known as:  HUMALOG KWIKPEN Inject 0.15 mLs (15 Units total) into the skin 3 (three) times daily.   Insulin Pen Needle 31G X 8 MM Misc Use with pen QD   ONE TOUCH LANCETS Misc Use as instructed to check blood sugars 3 times daily. Dx Code E11.65   pseudoephedrine 60 MG tablet Commonly known as:  SUDAFED Take 1 tablet (60 mg total) by mouth 2 (two) times daily as needed for congestion.       Allergies: No Known Allergies  Past Medical History:  Diagnosis Date  . Abnormal vaginal Pap smear    tx with cryotherapy  . Anxiety   . Depression   . Diabetes mellitus without complication (HCC)   . History of kidney stones    passed stone - no surgery  . Hypertension   . Migraine    otc med prn  . Obesity   . Pseudotumor cerebri   . SVD (spontaneous vaginal delivery)    x 3  . Tension headache     Past Surgical History:  Procedure Laterality Date  . ABDOMINAL HYSTERECTOMY    . ANTERIOR CERVICAL DECOMP/DISCECTOMY FUSION N/A 01/03/2016   Procedure: ANTERIOR CERVICAL DECOMPRESSION FUSION CERVICAL FIVE-SIX,CERVICAL SIX-SEVEN.;  Surgeon: Lisbeth Renshaw, MD;  Location: [facility];  Service: Neurosurgery;  Laterality: N/A;  right side approach  . CRYOTHERAPY    . LAPAROSCOPIC ASSISTED VAGINAL HYSTERECTOMY Bilateral  04/21/2014   Procedure: LAPAROSCOPIC ASSISTED VAGINAL HYSTERECTOMY, BILATERAL SALPINGECTOMY;  Surgeon: Lavina Hamman, MD;  Location: [facility];  Service: Gynecology;  Laterality: Bilateral;  . MANDIBLE FRACTURE SURGERY  2002   x 1  . TUBAL  LIGATION  11/2003  . WISDOM TOOTH EXTRACTION      Family History  Problem Relation Age of Onset  . Hypertension Mother   . Diabetes Mother   . Diabetes Sister   . Hypertension Sister     Social History:  reports that she has never smoked. She has never used smokeless tobacco. She reports that she drinks about 0.6 oz of alcohol per week. She reports that she does not use drugs.   Review of Systems  Constitutional: Positive for malaise. Negative for weight loss.  HENT: Negative for headaches.   Eyes: Positive for blurred vision.  Respiratory: Positive for daytime sleepiness.   Cardiovascular: Positive for leg swelling.  Gastrointestinal: Negative for nausea.  Endocrine: Positive for fatigue and polydipsia.  Genitourinary: Positive for nocturia.  Musculoskeletal:       Knees  Skin: Negative for rash.  Neurological: Positive for numbness.       Has some feeling of numbness in the left top of the toe and occasional sharp pains.  Also has numbness in her left hand related to her cervical disc problems  Psychiatric/Behavioral: Positive for insomnia.Negative for depressed mood.       Depression controlled on Lexapro which she has taken for 4 years   Lab Results  Component Value Date   TSH 3.33 09/02/2017     Lipid history: Not on treatment, she may have had an increased ALT when she was on Lipitor 80 mg and has not been tried on any other statin    Lab Results  Component Value Date   CHOL 340 (H) 10/28/2015   HDL 28 (L) 10/28/2015   LDLCALC 250 (H) 10/28/2015   LDLDIRECT 122 07/27/2016   TRIG 310 (H) 10/28/2015   CHOLHDL 12.1 (H) 10/28/2015           Lab Results  Component Value Date   ALT 54 (H) 09/02/2017   Hypertension: This is  being treated with atenolol 100 mg only  BP Readings from Last 3 Encounters:  08/02/17 132/84  07/25/17 138/80  06/21/17 134/78    Most recent eye exam was in 2017  Most recent foot exam:    LABS:  No visits with results within 1 Week(s) from this visit.  Latest known visit with results is:  Lab on 09/02/2017  Component Date Value Ref Range Status  . TSH 09/02/2017 3.33  0.35 - 4.50 uIU/mL Final  . Sodium 09/02/2017 137  135 - 145 mEq/L Final  . Potassium 09/02/2017 4.1  3.5 - 5.1 mEq/L Final  . Chloride 09/02/2017 106  96 - 112 mEq/L Final  . CO2 09/02/2017 24  19 - 32 mEq/L Final  . Glucose, Bld 09/02/2017 296* 70 - 99 mg/dL Final  . BUN 30/86/5784 23  6 - 23 mg/dL Final  . Creatinine, Ser 09/02/2017 0.95  0.40 - 1.20 mg/dL Final  . Total Bilirubin 09/02/2017 0.5  0.2 - 1.2 mg/dL Final  . Alkaline Phosphatase 09/02/2017 105  39 - 117 U/L Final  . AST 09/02/2017 26  0 - 37 U/L Final  . ALT 09/02/2017 54* 0 - 35 U/L Final  . Total Protein 09/02/2017 6.8  6.0 - 8.3 g/dL Final  . Albumin 69/62/9528 3.9  3.5 - 5.2 g/dL Final  . Calcium 41/32/4401 9.5  8.4 - 10.5 mg/dL Final  . GFR 02/72/5366 67.08  >60.00 mL/min Final  . Fructosamine 09/02/2017 266  0 - 285 umol/L Final   Comment: Published reference interval for apparently healthy  subjects between age 75 and 79 is 50 - 285 umol/L and in a poorly controlled diabetic population is 228 - 563 umol/L with a mean of 396 umol/L.     Physical Examination:  LMP 09/23/2013   GENERAL:         Patient has marked generalized obesity.    HEENT:         Eye exam shows normal external appearance.  Fundus exam shows no retinopathy.  Oral exam shows normal mucosa .  NECK:   There is no lymphadenopathy Thyroid is not enlarged and no nodules felt.  Carotids are normal to palpation and no bruit heard  LUNGS:         Chest is symmetrical. Lungs are clear to auscultation.Marland Kitchen   HEART:         Heart sounds:  S1 and S2 are normal. No  murmur or click heard., no S3 or S4.   ABDOMEN:   There is no distention present. Liver and spleen are not palpable. No other mass or tenderness present.    NEUROLOGICAL:   Ankle jerks are absent bilaterally.  Biceps reflexes are normal  Diabetic Foot Exam - Simple   No data filed             Vibration sense is mildly reduced in distal first toes, somewhat more on the left.  MUSCULOSKELETAL:  There is no swelling or deformity of the peripheral joints.     EXTREMITIES:     There is no pitting edema.  SKIN:       No rash or lesions of concern.        ASSESSMENT:  Diabetes type 2, uncontrolled with morbid obesity     She has had persistently high A1c since her diagnosis in 2016 has not responded to various therapies including Bydureon and now basal bolus insulin regimen also She reportedly had frequent candidiasis from Jardiance 25 mg previously  She also has difficulty with controlling her diet especially with her insomnia Does not exercise and has minimal knowledge of her diabetes overall Is using a generic monitor and no blood sugar pattern can be determined but she seems to have both high fasting and postprandial readings persistently   Complications of diabetes:?  Neuropathy  HYPERLIPIDEMIA: She may have familial hyperlipidemia with marked increase in LDL Not clear if she had increased ALT from Lipitor and needs to be on a different statin drug; however repeat ALT needs to be done  ESSENTIAL hypertension: Controlled but she is not on an ACE inhibitor which would be recommended for diabetes  Other medical problems including history of depression, knee pain, left cervical disc disease and possible sleep apnea followed by PCP   PLAN:     She needs consultation with dietitian for meal planning since she does need to significantly improve her diet especially  to cut back on total calories and overnight snacking   She will probably get better benefits on blood sugar control and  weight loss with Ozempic compared to Bydureon and this may need to be prior authorized.  However this is not available as she may be able to take Victoza  Discussed dosage regimen with OZEMPIC and Victoza and will start with lower doses initially and build up to the maximum dose on either one  Meanwhile she will increase her Lantus by 20 units at night  She will start checking blood sugars with an Accu-Chek monitor which can be downloaded  Discussed timing and targets of blood sugar  testing at various times including postprandial  For now she will increase her HUMALOG to 20 instead of 15 units and also likely will need 15 units for any large snack at night  Start walking for exercise  Follow-up in 4 weeks  Also she can try a lower dose of JARDIANCE 10 mg every other day for now; she has not had any Candida vaginitis even with severe hyperglycemia recently and should be able to tolerate this now  Discussed also that she may be a candidate for bariatric surgery because of her marked obesity and diabetes and insulin resistance  Check TSH on next visit, she has fatigue and obesity and has not had this evaluated recently  There are no Patient Instructions on file for this visit.  Consultation note has been sent to the referring physician  Counseling time on subjects discussed in assessment and plan sections is over 50% of today's 60 minute visit  Reather Littler 09/22/2017, 8:46 PM   Note: This office note was prepared with Dragon voice recognition system technology. Any transcriptional errors that result from this process are unintentional.

## 2017-09-23 ENCOUNTER — Ambulatory Visit: Payer: Medicaid Other | Admitting: Endocrinology

## 2017-09-30 ENCOUNTER — Other Ambulatory Visit: Payer: Self-pay | Admitting: Family Medicine

## 2017-09-30 ENCOUNTER — Telehealth: Payer: Self-pay | Admitting: Endocrinology

## 2017-09-30 NOTE — Telephone Encounter (Signed)
Pt is requesting refill on Xanax   LOV: 07/25/17  LRF:   07/11/17

## 2017-09-30 NOTE — Telephone Encounter (Signed)
Patient stated that  empagliflozin (JARDIANCE) 10 MG TABS tablet Causes her to have really bad yeast infections. She would like to know if Dr Lucianne MussKumar can send in a prescription for a yeast infection. She has an appointment coming up this week with him but stated she can not wait that long.    Please advise   [id number] - [place], [place] - [address]

## 2017-10-01 ENCOUNTER — Other Ambulatory Visit: Payer: Self-pay

## 2017-10-01 MED ORDER — FLUCONAZOLE 150 MG PO TABS: 150.0000 mg | ORAL_TABLET | Freq: Once | ORAL | 1 refills | Status: DC

## 2017-10-01 NOTE — Telephone Encounter (Signed)
Patient notified of new Rx. Pt. Verbalized understanding.

## 2017-10-01 NOTE — Telephone Encounter (Signed)
Please advise if okay to send medication, and if so, what dosage

## 2017-10-01 NOTE — Telephone Encounter (Signed)
Diflucan 150 mg, 1 tablet with 1 refill

## 2017-10-02 ENCOUNTER — Ambulatory Visit (INDEPENDENT_AMBULATORY_CARE_PROVIDER_SITE_OTHER): Payer: Medicaid Other | Admitting: Endocrinology

## 2017-10-02 ENCOUNTER — Encounter: Payer: Self-pay | Admitting: Endocrinology

## 2017-10-02 VITALS — BP 122/78 | HR 70 | Ht 61.0 in | Wt 276.8 lb

## 2017-10-02 DIAGNOSIS — E1165 Type 2 diabetes mellitus with hyperglycemia: Secondary | ICD-10-CM

## 2017-10-02 DIAGNOSIS — E782 Mixed hyperlipidemia: Secondary | ICD-10-CM

## 2017-10-02 DIAGNOSIS — Z794 Long term (current) use of insulin: Secondary | ICD-10-CM

## 2017-10-02 MED ORDER — ACCU-CHEK AVIVA CONNECT W/DEVICE KIT: 1.0000 | PACK | 0 refills | Status: DC

## 2017-10-02 MED ORDER — ROSUVASTATIN CALCIUM 5 MG PO TABS: 5.0000 mg | ORAL_TABLET | Freq: Every day | ORAL | 3 refills | Status: DC

## 2017-10-02 MED ORDER — GLUCOSE BLOOD VI STRP: ORAL_STRIP | 12 refills | Status: DC

## 2017-10-02 NOTE — Progress Notes (Signed)
Patient ID: Kimberly [name], female   DOB: Jun 06, 1971, 47 y.o.   MRN: [id number]          Reason for Appointment: Follow-up for Type 2 Diabetes  Referring physician: Lynnea Ferrier   History of Present Illness:          Date of diagnosis of type 2 diabetes mellitus: 12/2014   Background history:   She was initially tried on metformin but because of side effects as was stopped and she was tried on glipizide and subsequently [place].  She apparently took Jardiance for at least a couple of months but because of recurrent yeast infection she stopped it and she did not think it was helping her glucose also He was started on Lantus insulin in 12/2015 Subsequently Bydureon was added in 03/2016  Recent history:   INSULIN regimen is:   Lantus insulin  50 bid, Humalog insulin 15 units ac    Non-insulin hypoglycemic drugs the patient is taking are: Bydureon 2 mg weekly, Jardiance 10 mg daily  Most recent A1c is 9.2 in February and fructosamine is now 266 in March  Current management, blood sugar patterns and problems identified:   She has not come back in follow-up since her initial consultation in February  Her insurance denied Ozempic and Victoza and she is still on Bydureon  Also it took her some time to get Jardiance approved by her insurance and she is now taking it for at least a month  With this her blood sugars appear to be somewhat better compared to her initial visit  She did not feel quite as thirsty  However a few days ago she did start with a yeast infection  She has only a few readings on her monitor which is not programmed to the right date and time, also not keeping a diary        Side effects from medications have been: Nausea and diarrhea from metformin  Glucose monitoring:  About 1 times a day         Glucometer:  [employer] brand .      Blood Glucose readings by time of day from review of monitor  PREMEAL Breakfast Lunch Dinner Bedtime  Overall   Glucose range:   175-315  181-302     Median:     ?   Self-care:  Typical meal intake: Breakfast is   bagel, scrambled egg/oatmeal, juice    Lunches sandwich or leftovers Dinner is variable, sometimes only cereal Snacks will be yogurt or chips            Dietician visit, most recent: 08/2017               Exercise:  some walking recently  Weight history:  Wt Readings from Last 3 Encounters:  10/02/17 276 lb 12.8 oz (125.6 kg)  09/12/17 277 lb (125.6 kg)  08/02/17 273 lb 6.4 oz (124 kg)    `Glycemic control:   Lab Results  Component Value Date   HGBA1C 9.2 08/02/2017   HGBA1C 10.9 (H) 01/10/2017   HGBA1C 6.8 (H) 07/27/2016   Lab Results  Component Value Date   MICROALBUR 0.3 03/23/2016   LDLCALC 250 (H) 10/28/2015   CREATININE 0.95 09/02/2017   No results found for: [facility]  Lab Results  Component Value Date   FRUCTOSAMINE 266 09/02/2017      Allergies as of 10/02/2017   No Known Allergies     Medication List        Accurate  as of 10/02/17  9:00 PM. Always use your most recent med list.          ACCU-CHEK AVIVA CONNECT w/Device Kit 1 each by Does not apply route as directed.   acetaZOLAMIDE 250 MG tablet Commonly known as:  DIAMOX TAKE 1 TABLET (250 MG TOTAL) BY MOUTH 2 (TWO) TIMES DAILY.   ALPRAZolam 1 MG tablet Commonly known as:  XANAX TAKE 1 TABLET BY MOUTH THREE TIMES A DAY AS NEEDED   atenolol 100 MG tablet Commonly known as:  TENORMIN TAKE ONE TABLET BY MOUTH ONCE DAILY   buPROPion 150 MG 12 hr tablet Commonly known as:  WELLBUTRIN SR Take 1 tablet (150 mg total) by mouth daily.   cetirizine 10 MG tablet Commonly known as:  ZYRTEC Take 1 tablet (10 mg total) by mouth daily.   empagliflozin 10 MG Tabs tablet Commonly known as:  JARDIANCE Take 10 mg by mouth daily with breakfast.   escitalopram 10 MG tablet Commonly known as:  LEXAPRO TAKE 1 TABLET (10 MG TOTAL) BY MOUTH DAILY.   Exenatide ER 2 MG Pen Commonly known as:  BYDUREON INJECT 2  MG INTO THE SKIN ONCE A WEEK.   fluticasone 50 MCG/ACT nasal spray Commonly known as:  FLONASE Place 2 sprays into both nostrils daily.   glucose blood test strip Commonly known as:  ACCU-CHEK AVIVA PLUS Use as instructed   Insulin Glargine 100 UNIT/ML Solostar Pen Commonly known as:  LANTUS SOLOSTAR Inject 50 Units into the skin 2 (two) times daily. 50 units bid   insulin lispro 100 UNIT/ML KiwkPen Commonly known as:  HUMALOG KWIKPEN Inject 0.15 mLs (15 Units total) into the skin 3 (three) times daily.   Insulin Pen Needle 31G X 8 MM Misc Use with pen QD   pseudoephedrine 60 MG tablet Commonly known as:  SUDAFED Take 1 tablet (60 mg total) by mouth 2 (two) times daily as needed for congestion.   rosuvastatin 5 MG tablet Commonly known as:  CRESTOR Take 1 tablet (5 mg total) by mouth daily.       Allergies: No Known Allergies  Past Medical History:  Diagnosis Date  . Abnormal vaginal Pap smear    tx with cryotherapy  . Anxiety   . Depression   . Diabetes mellitus without complication (HCC)   . History of kidney stones    passed stone - no surgery  . Hypertension   . Migraine    otc med prn  . Obesity   . Pseudotumor cerebri   . SVD (spontaneous vaginal delivery)    x 3  . Tension headache     Past Surgical History:  Procedure Laterality Date  . ABDOMINAL HYSTERECTOMY    . ANTERIOR CERVICAL DECOMP/DISCECTOMY FUSION N/A 01/03/2016   Procedure: ANTERIOR CERVICAL DECOMPRESSION FUSION CERVICAL FIVE-SIX,CERVICAL SIX-SEVEN.;  Surgeon: Lisbeth Renshaw, MD;  Location: [facility];  Service: Neurosurgery;  Laterality: N/A;  right side approach  . CRYOTHERAPY    . LAPAROSCOPIC ASSISTED VAGINAL HYSTERECTOMY Bilateral 04/21/2014   Procedure: LAPAROSCOPIC ASSISTED VAGINAL HYSTERECTOMY, BILATERAL SALPINGECTOMY;  Surgeon: Lavina Hamman, MD;  Location: [facility];  Service: Gynecology;  Laterality: Bilateral;  . MANDIBLE FRACTURE SURGERY  2002   x 1  . TUBAL LIGATION   11/2003  . WISDOM TOOTH EXTRACTION      Family History  Problem Relation Age of Onset  . Hypertension Mother   . Diabetes Mother   . Diabetes Sister   . Hypertension Sister  Social History:  reports that she has never smoked. She has never used smokeless tobacco. She reports that she drinks about 0.6 oz of alcohol per week. She reports that she does not use drugs.   Review of Systems  No history of thyroid disease  Lab Results  Component Value Date   TSH 3.33 09/02/2017    Lipid history: Not on treatment, she may have had an increased ALT when she was on Lipitor 80 mg and has not been tried on any other statin However appears to have persistently high ALT    Lab Results  Component Value Date   CHOL 340 (H) 10/28/2015   HDL 28 (L) 10/28/2015   LDLCALC 250 (H) 10/28/2015   LDLDIRECT 122 07/27/2016   TRIG 310 (H) 10/28/2015   CHOLHDL 12.1 (H) 10/28/2015           Lab Results  Component Value Date   ALT 54 (H) 09/02/2017   Hypertension: This is being treated with atenolol 100 mg only by her PCP  BP Readings from Last 3 Encounters:  10/02/17 122/78  08/02/17 132/84  07/25/17 138/80    Most recent eye exam was in 2017  Most recent foot exam:2/19  She is complaining about daytime somnolence and will follow sleep very easily even when sitting for short time.  She was supposed to talk to her PCP about a sleep study but has not done so     LABS:  No visits with results within 1 Week(s) from this visit.  Latest known visit with results is:  Lab on 09/02/2017  Component Date Value Ref Range Status  . TSH 09/02/2017 3.33  0.35 - 4.50 uIU/mL Final  . Sodium 09/02/2017 137  135 - 145 mEq/L Final  . Potassium 09/02/2017 4.1  3.5 - 5.1 mEq/L Final  . Chloride 09/02/2017 106  96 - 112 mEq/L Final  . CO2 09/02/2017 24  19 - 32 mEq/L Final  . Glucose, Bld 09/02/2017 296* 70 - 99 mg/dL Final  . BUN 16/03/9603 23  6 - 23 mg/dL Final  . Creatinine, Ser 09/02/2017  0.95  0.40 - 1.20 mg/dL Final  . Total Bilirubin 09/02/2017 0.5  0.2 - 1.2 mg/dL Final  . Alkaline Phosphatase 09/02/2017 105  39 - 117 U/L Final  . AST 09/02/2017 26  0 - 37 U/L Final  . ALT 09/02/2017 54* 0 - 35 U/L Final  . Total Protein 09/02/2017 6.8  6.0 - 8.3 g/dL Final  . Albumin 54/02/8118 3.9  3.5 - 5.2 g/dL Final  . Calcium 14/78/2956 9.5  8.4 - 10.5 mg/dL Final  . GFR 21/30/8657 67.08  >60.00 mL/min Final  . Fructosamine 09/02/2017 266  0 - 285 umol/L Final   Comment: Published reference interval for apparently healthy subjects between age 27 and 41 is 42 - 285 umol/L and in a poorly controlled diabetic population is 228 - 563 umol/L with a mean of 396 umol/L.     Physical Examination:  BP 122/78 (BP Location: Right Arm, Patient Position: Sitting, Cuff Size: Normal)   Pulse 70   Ht 5\' 1"  (1.549 m)   Wt 276 lb 12.8 oz (125.6 kg)   LMP 09/23/2013   SpO2 97%   BMI 52.30 kg/m        ASSESSMENT:  Diabetes type 2, uncontrolled with morbid obesity     She has had persistently high A1c since her diagnosis in 2016   Her fructosamine last month indicates that her overall control  is improved, previously was getting readings at home of 400 or so at times This is with adding Jardiance to her basal bolus insulin regimen Although she has developed a yeast infection this week not clear if this is an initial reaction or may be persistent or recurrent  However she is still not able to lose weight despite starting Jardiance and seeing the dietitian recently Does not check blood sugars enough and difficult to know what her pattern is Also discussed that her meter may not be accurate being a generic. Her main meals in the evening and is not checking at those times Also still having an issue with eating snacks when she is not able to sleep   HYPERLIPIDEMIA: She has a history of markedly increased lipids and not on any treatment  Abnormal LFTs: Likely to be from fatty liver as  this is still persistent This is likely better with recent improved control  ESSENTIAL hypertension: Treated by PCP, not on ACE inhibitor  Possible sleep apnea: She has significant symptoms and needs to follow-up with PCP   PLAN:     She will be given an Accu-Chek meter to start checking her blood sugars more accurately and we can download her meter on each visit  She does need to check readings consistently about 2 hours after various meals to help adjust her insulin  Since she still has mostly high readings she will increase all her insulin doses as below  Continue Jardiance for now and she can take Diflucan as needed  Continue to improve diet  More increase in her exercise and walking routine  Follow-up in 4 weeks  Consider follow-up with dietitian  If she has difficulty with compliance with basal bolus insulin may consider insulin pump  However she does need to start checking her blood sugar regularly and showed better compliance with all measures first including follow-up  LIPIDS: We will give her a trial of Crestor 5 mg daily and follow-up ALT on the next visit, if this is about the same we will increase it to 20 mg She needs an LDL reduction of at least 13%  Patient Instructions  Lantus insulin  50 pm and 60 in am  Humalog insulin 20--20--25 supper   Check blood sugars on waking up  3/7  Also check blood sugars about 2 hours after a meal and do this after different meals by rotation  Recommended blood sugar levels on waking up is 90-130 and about 2 hours after meal is 130-180  Please bring your blood sugar monitor to each visit, thank you    Counseling time on subjects discussed in assessment and plan sections is over 50% of today's 25 minute visit   Reather Littler 10/02/2017, 9:00 PM   Note: This office note was prepared with Dragon voice recognition system technology. Any transcriptional errors that result from this process are unintentional.

## 2017-10-02 NOTE — Patient Instructions (Addendum)
Lantus insulin  50 pm and 60 in am  Humalog insulin 20--20--25 supper   Check blood sugars on waking up  3/7  Also check blood sugars about 2 hours after a meal and do this after different meals by rotation  Recommended blood sugar levels on waking up is 90-130 and about 2 hours after meal is 130-180  Please bring your blood sugar monitor to each visit, thank you

## 2017-10-09 ENCOUNTER — Telehealth: Payer: Self-pay | Admitting: Emergency Medicine

## 2017-10-09 NOTE — Telephone Encounter (Signed)
Pt called and wants to know when her accu meter is going to be called into the pharmacy. Please give her a call back. Thanks.

## 2017-10-10 ENCOUNTER — Other Ambulatory Visit: Payer: Self-pay

## 2017-10-10 ENCOUNTER — Telehealth: Payer: Self-pay | Admitting: Endocrinology

## 2017-10-10 MED ORDER — ACCU-CHEK FASTCLIX LANCET KIT: 1.0000 | PACK | Freq: Every day | 12 refills | Status: AC

## 2017-10-10 MED ORDER — GLUCOSE BLOOD VI STRP: ORAL_STRIP | 12 refills | Status: DC

## 2017-10-10 MED ORDER — ACCU-CHEK GUIDE ME W/DEVICE KIT: 1.0000 | PACK | Freq: Every day | 0 refills | Status: DC

## 2017-10-10 NOTE — Telephone Encounter (Signed)
Insurance does not cover Accu-check aviva plus test strips. They do cover Accu-check guide test strips. Do you approve the switch. They also want how many times per day to test.

## 2017-10-10 NOTE — Telephone Encounter (Signed)
Accu-Chek guide is okay and same frequency of testing as before

## 2017-10-10 NOTE — Telephone Encounter (Signed)
Patient is returning your call.  

## 2017-10-10 NOTE — Telephone Encounter (Signed)
Refill was sent in. Pharmacy was called to clarify everything reads as it needs to. Pharmacy stated that they will notify pt that RX is ready for pickup.

## 2017-10-10 NOTE — Telephone Encounter (Signed)
Pt was called and notified that order was called into pharmacy.

## 2017-10-10 NOTE — Progress Notes (Signed)
a 

## 2017-10-18 ENCOUNTER — Ambulatory Visit: Payer: Medicaid Other | Admitting: Dietician

## 2017-10-18 ENCOUNTER — Other Ambulatory Visit: Payer: Self-pay | Admitting: Family Medicine

## 2017-10-24 ENCOUNTER — Ambulatory Visit: Payer: Medicaid Other | Admitting: Dietician

## 2017-10-28 ENCOUNTER — Other Ambulatory Visit: Payer: Self-pay

## 2017-10-28 MED ORDER — INSULIN GLARGINE 100 UNIT/ML SOLOSTAR PEN: PEN_INJECTOR | SUBCUTANEOUS | 3 refills | Status: DC

## 2017-10-31 ENCOUNTER — Telehealth: Payer: Self-pay | Admitting: Endocrinology

## 2017-10-31 ENCOUNTER — Telehealth: Payer: Self-pay

## 2017-10-31 ENCOUNTER — Other Ambulatory Visit: Payer: Self-pay | Admitting: Endocrinology

## 2017-10-31 MED ORDER — FLUCONAZOLE 150 MG PO TABS: 150.0000 mg | ORAL_TABLET | Freq: Once | ORAL | 1 refills | Status: AC

## 2017-10-31 NOTE — Telephone Encounter (Signed)
Called [employer] to initiate PA for Bydureon  pen-injector. PA was approved from 10/31/2017 through 10/26/2018.  PA # is [id number]  Interaction ID # is [id number].

## 2017-10-31 NOTE — Telephone Encounter (Signed)
Please advise 

## 2017-10-31 NOTE — Telephone Encounter (Signed)
Prescription sent

## 2017-10-31 NOTE — Telephone Encounter (Signed)
Pt called about having a yeast infection from taking the empagliflozin (JARDIANCE) 10 MG TABS tablet pt states she needs something for it called in. Please advise and Thank you?  Pharmacy is [id number] - [place], [place] - [address]  Call pt @ [phone].

## 2017-11-15 ENCOUNTER — Other Ambulatory Visit: Payer: Medicaid Other

## 2017-11-21 ENCOUNTER — Ambulatory Visit: Payer: Medicaid Other | Admitting: Endocrinology

## 2017-11-22 DIAGNOSIS — Z1231 Encounter for screening mammogram for malignant neoplasm of breast: Secondary | ICD-10-CM | POA: Diagnosis not present

## 2017-12-15 ENCOUNTER — Other Ambulatory Visit: Payer: Self-pay | Admitting: Family Medicine

## 2017-12-17 ENCOUNTER — Other Ambulatory Visit: Payer: Self-pay | Admitting: Family Medicine

## 2017-12-24 ENCOUNTER — Other Ambulatory Visit: Payer: Self-pay | Admitting: Family Medicine

## 2017-12-24 NOTE — Telephone Encounter (Signed)
Pt is requesting refill on Xanax   LOV: 07/25/17  LRF:  10/01/17

## 2018-01-09 ENCOUNTER — Other Ambulatory Visit (INDEPENDENT_AMBULATORY_CARE_PROVIDER_SITE_OTHER): Payer: Medicaid Other

## 2018-01-09 DIAGNOSIS — Z794 Long term (current) use of insulin: Secondary | ICD-10-CM

## 2018-01-09 DIAGNOSIS — E1165 Type 2 diabetes mellitus with hyperglycemia: Secondary | ICD-10-CM

## 2018-01-09 DIAGNOSIS — E782 Mixed hyperlipidemia: Secondary | ICD-10-CM

## 2018-01-09 LAB — COMPREHENSIVE METABOLIC PANEL
ALT: 53 U/L — ABNORMAL HIGH (ref 0–35)
AST: 30 U/L (ref 0–37)
Albumin: 4.1 g/dL (ref 3.5–5.2)
Alkaline Phosphatase: 105 U/L (ref 39–117)
BUN: 21 mg/dL (ref 6–23)
CO2: 23 mEq/L (ref 19–32)
Calcium: 9.1 mg/dL (ref 8.4–10.5)
Chloride: 104 mEq/L (ref 96–112)
Creatinine, Ser: 1.08 mg/dL (ref 0.40–1.20)
GFR: 57.76 mL/min — ABNORMAL LOW (ref >60.00)
Glucose, Bld: 345 mg/dL — ABNORMAL HIGH (ref 70–99)
Potassium: 4 mEq/L (ref 3.5–5.1)
Sodium: 133 mEq/L — ABNORMAL LOW (ref 135–145)
Total Bilirubin: 0.6 mg/dL (ref 0.2–1.2)
Total Protein: 6.9 g/dL (ref 6.0–8.3)

## 2018-01-09 LAB — LIPID PANEL
Cholesterol: 188 mg/dL (ref 0–200)
HDL: 29.9 mg/dL — ABNORMAL LOW (ref >39.00)
Total CHOL/HDL Ratio: 6
Triglycerides: 454 mg/dL — ABNORMAL HIGH (ref 0.0–149.0)

## 2018-01-09 LAB — LDL CHOLESTEROL, DIRECT: Direct LDL: 107 mg/dL

## 2018-01-09 LAB — HEMOGLOBIN A1C: Hgb A1c MFr Bld: 8.9 % — ABNORMAL HIGH (ref 4.6–6.5)

## 2018-01-14 ENCOUNTER — Encounter: Payer: Medicaid Other | Attending: Endocrinology | Admitting: Nutrition

## 2018-01-14 ENCOUNTER — Encounter: Payer: Self-pay | Admitting: Family Medicine

## 2018-01-14 ENCOUNTER — Ambulatory Visit: Payer: Medicaid Other | Admitting: Family Medicine

## 2018-01-14 ENCOUNTER — Ambulatory Visit: Payer: Medicaid Other | Admitting: Endocrinology

## 2018-01-14 ENCOUNTER — Encounter: Payer: Self-pay | Admitting: Endocrinology

## 2018-01-14 VITALS — BP 116/80 | HR 86 | Ht 61.0 in | Wt 273.4 lb

## 2018-01-14 VITALS — BP 128/74 | HR 68 | Temp 97.7°F | Resp 14 | Ht 61.0 in | Wt 275.0 lb

## 2018-01-14 DIAGNOSIS — E1165 Type 2 diabetes mellitus with hyperglycemia: Secondary | ICD-10-CM | POA: Diagnosis not present

## 2018-01-14 DIAGNOSIS — Z794 Long term (current) use of insulin: Secondary | ICD-10-CM | POA: Diagnosis not present

## 2018-01-14 DIAGNOSIS — Z713 Dietary counseling and surveillance: Secondary | ICD-10-CM | POA: Insufficient documentation

## 2018-01-14 DIAGNOSIS — F321 Major depressive disorder, single episode, moderate: Secondary | ICD-10-CM

## 2018-01-14 DIAGNOSIS — E782 Mixed hyperlipidemia: Secondary | ICD-10-CM | POA: Diagnosis not present

## 2018-01-14 MED ORDER — INSULIN REGULAR HUMAN (CONC) 500 UNIT/ML ~~LOC~~ SOLN: SUBCUTANEOUS | 0 refills | Status: DC

## 2018-01-14 MED ORDER — METFORMIN HCL ER 500 MG PO TB24: 2000.0000 mg | ORAL_TABLET | Freq: Every day | ORAL | 3 refills | Status: DC

## 2018-01-14 NOTE — Progress Notes (Addendum)
Patient ID: Kimberly [name], female   DOB: [birth year], 47 y.o.   MRN: [id number]          Reason for Appointment: Follow-up for Type 2 Diabetes  Referring physician: Lynnea Ferrier   History of Present Illness:          Date of diagnosis of type 2 diabetes mellitus: 12/2014   Background history:   She was initially tried on metformin but because of side effects as was stopped and she was tried on glipizide and subsequently [place].  She apparently took Jardiance for at least a couple of months but because of recurrent yeast infection she stopped it and she did not think it was helping her glucose also He was started on Lantus insulin in 12/2015 Subsequently Bydureon was added in 03/2016  Recent history:   INSULIN regimen is:   Lantus insulin  50 bid, Humalog insulin 15 units ac    Non-insulin hypoglycemic drugs the patient is taking are: Bydureon 2 mg weekly, Jardiance 10 mg daily  Most recent A1c is 8.9, previously 9.2  Current management, blood sugar patterns and problems identified:   She has not been taking her Jardiance once about 2 weeks ago because of persistent yeast infections not resolved by taking Diflucan  She did not notify us  However glucose readings have been generally well over 200 over the last month  She has checked her blood sugars very sporadically also overall  HIGHEST blood sugar has been 354 midday  Her insurance denied Ozempic and Victoza and she is still on Bydureon  She now says that because of her work she does not frequently take her insulin at lunchtime for her lunch meal  Also rarely checking fasting readings and yesterday this was 330  She has seen the dietitian but not clear if she is following her meal plan and has not lost much weight        Side effects from medications have been: Nausea and diarrhea from metformin  Glucose monitoring:  About 1 times a day         Glucometer:  Accu-Chek .      Blood Glucose readings by time of  day from review of monitor  Mean values apply above for all meters except median for One Touch  PRE-MEAL Fasting  midday Dinner Bedtime Overall  Glucose range:  330  206-354  242, 261    Mean/median:   282    269   POST-MEAL PC Breakfast PC Lunch PC Dinner  Glucose range:    209-348  Mean/median:    250   PREVIOUS readings:  PREMEAL Breakfast Lunch Dinner Bedtime  Overall   Glucose range:  175-315  181-302     Median:     ?   Self-care:  Typical meal intake: Breakfast is   bagel, scrambled egg/oatmeal, juice    Lunches sandwich or leftovers Dinner is variable, sometimes only cereal Snacks will be yogurt or chips            Dietician visit, most recent: 08/2017               Exercise:  some walking at times the bottom  Weight history:  Wt Readings from Last 3 Encounters:  01/14/18 273 lb 6.4 oz (124 kg)  10/02/17 276 lb 12.8 oz (125.6 kg)  09/12/17 277 lb (125.6 kg)    `Glycemic control:   Lab Results  Component Value Date   HGBA1C 8.9 (H) 01/09/2018   HGBA1C  9.2 08/02/2017   HGBA1C 10.9 (H) 01/10/2017   Lab Results  Component Value Date   MICROALBUR 0.3 03/23/2016   LDLCALC 250 (H) 10/28/2015   CREATININE 1.08 01/09/2018   No results found for: [facility]  Lab Results  Component Value Date   FRUCTOSAMINE 266 09/02/2017      Allergies as of 01/14/2018   No Known Allergies     Medication List        Accurate as of 01/14/18  2:17 PM. Always use your most recent med list.          ACCU-CHEK FASTCLIX LANCET Kit 1 each by Does not apply route daily. Use to check blood sugar 5 times daily.   ACCU-CHEK GUIDE ME w/Device Kit 1 each by Does not apply route daily.   acetaZOLAMIDE 250 MG tablet Commonly known as:  DIAMOX TAKE 1 TABLET (250 MG TOTAL) BY MOUTH 2 (TWO) TIMES DAILY.   ALPRAZolam 1 MG tablet Commonly known as:  XANAX TAKE 1 TABLET BY MOUTH THREE TIMES A DAY AS NEEDED   atenolol 100 MG tablet Commonly known as:  TENORMIN TAKE ONE  TABLET BY MOUTH ONCE DAILY   buPROPion 150 MG 12 hr tablet Commonly known as:  WELLBUTRIN SR Take 1 tablet (150 mg total) by mouth daily.   cetirizine 10 MG tablet Commonly known as:  ZYRTEC Take 1 tablet (10 mg total) by mouth daily.   escitalopram 10 MG tablet Commonly known as:  LEXAPRO TAKE 1 TABLET (10 MG TOTAL) BY MOUTH DAILY.   Exenatide ER 2 MG Pen Commonly known as:  BYDUREON INJECT 2 MG INTO THE SKIN ONCE A WEEK.   fluticasone 50 MCG/ACT nasal spray Commonly known as:  FLONASE Place 2 sprays into both nostrils daily.   glucose blood test strip Commonly known as:  ACCU-CHEK GUIDE Use as instructed to test blood sugar 5 times daily.   HUMALOG KWIKPEN 100 UNIT/ML KiwkPen Generic drug:  insulin lispro INJECT 15 UNITS INTO THE SKIN 3 TIMES A DAY   Insulin Glargine 100 UNIT/ML Solostar Pen Commonly known as:  LANTUS SOLOSTAR Inject 60 units in the am and 50 units in the pm   Insulin Pen Needle 31G X 8 MM Misc Use with pen QD   metFORMIN 500 MG 24 hr tablet Commonly known as:  GLUCOPHAGE-XR Take 4 tablets (2,000 mg total) by mouth daily with supper.   rosuvastatin 5 MG tablet Commonly known as:  CRESTOR Take 1 tablet (5 mg total) by mouth daily.       Allergies: No Known Allergies  Past Medical History:  Diagnosis Date  . Abnormal vaginal Pap smear    tx with cryotherapy  . Anxiety   . Depression   . Diabetes mellitus without complication (HCC)   . History of kidney stones    passed stone - no surgery  . Hypertension   . Migraine    otc med prn  . Obesity   . Pseudotumor cerebri   . SVD (spontaneous vaginal delivery)    x 3  . Tension headache     Past Surgical History:  Procedure Laterality Date  . ABDOMINAL HYSTERECTOMY    . ANTERIOR CERVICAL DECOMP/DISCECTOMY FUSION N/A 01/03/2016   Procedure: ANTERIOR CERVICAL DECOMPRESSION FUSION CERVICAL FIVE-SIX,CERVICAL SIX-SEVEN.;  Surgeon: Lisbeth Renshaw, MD;  Location: [facility];  Service:  Neurosurgery;  Laterality: N/A;  right side approach  . CRYOTHERAPY    . LAPAROSCOPIC ASSISTED VAGINAL HYSTERECTOMY Bilateral 04/21/2014   Procedure: LAPAROSCOPIC  ASSISTED VAGINAL HYSTERECTOMY, BILATERAL SALPINGECTOMY;  Surgeon: Lavina Hamman, MD;  Location: [facility];  Service: Gynecology;  Laterality: Bilateral;  . MANDIBLE FRACTURE SURGERY  2002   x 1  . TUBAL LIGATION  11/2003  . WISDOM TOOTH EXTRACTION      Family History  Problem Relation Age of Onset  . Hypertension Mother   . Diabetes Mother   . Diabetes Sister   . Hypertension Sister     Social History:  reports that she has never smoked. She has never used smokeless tobacco. She reports that she drinks about 0.6 oz of alcohol per week. She reports that she does not use drugs.   Review of Systems   Lipid history: Currently taking Crestor 5 mg daily No change in liver functions with this Triglycerides are significantly high, LDL 107    Lab Results  Component Value Date   CHOL 188 01/09/2018   HDL 29.90 (L) 01/09/2018   LDLCALC 250 (H) 10/28/2015   LDLDIRECT 107.0 01/09/2018   TRIG (H) 01/09/2018    454.0 Triglyceride is over 400; calculations on Lipids are invalid.   CHOLHDL 6 01/09/2018           Lab Results  Component Value Date   ALT 53 (H) 01/09/2018   Hypertension: This is being treated with atenolol 100 mg only by her PCP  BP Readings from Last 3 Encounters:  01/14/18 116/80  10/02/17 122/78  08/02/17 132/84    Most recent eye exam was in 2017  Most recent foot exam:2/19  She is complaining about daytime somnolence   She was supposed to talk to her PCP about a sleep study       LABS:  Lab on 01/09/2018  Component Date Value Ref Range Status  . Cholesterol 01/09/2018 188  0 - 200 mg/dL Final   ATP III Classification       Desirable:  < 200 mg/dL               Borderline High:  200 - 239 mg/dL          High:  > = 409 mg/dL  . Triglycerides 01/09/2018 454.0 Triglyceride is over 400;  calculations on Lipids are invalid.* 0.0 - 149.0 mg/dL Final   Normal:  <811 mg/dLBorderline High:  150 - 199 mg/dL  . HDL 01/09/2018 29.90* >39.00 mg/dL Final  . Total CHOL/HDL Ratio 01/09/2018 6   Final                  Men          Women1/2 Average Risk     3.4          3.3Average Risk          5.0          4.42X Average Risk          9.6          7.13X Average Risk          15.0          11.0                      . Sodium 01/09/2018 133* 135 - 145 mEq/L Final  . Potassium 01/09/2018 4.0  3.5 - 5.1 mEq/L Final  . Chloride 01/09/2018 104  96 - 112 mEq/L Final  . CO2 01/09/2018 23  19 - 32 mEq/L Final  . Glucose, Bld 01/09/2018 345* 70 - 99 mg/dL Final  .  BUN 01/09/2018 21  6 - 23 mg/dL Final  . Creatinine, Ser 01/09/2018 1.08  0.40 - 1.20 mg/dL Final  . Total Bilirubin 01/09/2018 0.6  0.2 - 1.2 mg/dL Final  . Alkaline Phosphatase 01/09/2018 105  39 - 117 U/L Final  . AST 01/09/2018 30  0 - 37 U/L Final  . ALT 01/09/2018 53* 0 - 35 U/L Final  . Total Protein 01/09/2018 6.9  6.0 - 8.3 g/dL Final  . Albumin 86/57/8469 4.1  3.5 - 5.2 g/dL Final  . Calcium 62/95/2841 9.1  8.4 - 10.5 mg/dL Final  . GFR 32/44/0102 57.76* >60.00 mL/min Final  . Hgb A1c MFr Bld 01/09/2018 8.9* 4.6 - 6.5 % Final   Glycemic Control Guidelines for People with Diabetes:Non Diabetic:  <6%Goal of Therapy: <7%Additional Action Suggested:  >8%   . Direct LDL 01/09/2018 107.0  mg/dL Final   Optimal:  <725 mg/dLNear or Above Optimal:  100-129 mg/dLBorderline High:  130-159 mg/dLHigh:  160-189 mg/dLVery High:  >190 mg/dL    Physical Examination:  BP 116/80 (BP Location: Left Arm, Patient Position: Sitting, Cuff Size: Large)   Pulse 86   Ht 5\' 1"  (1.549 m)   Wt 273 lb 6.4 oz (124 kg)   LMP 09/23/2013   SpO2 98%   BMI 51.66 kg/m        ASSESSMENT:  Diabetes type 2, uncontrolled with morbid obesity     She has had persistently high A1c since her diagnosis in 2016   Currently on a regimen of basal bolus  insulin, Bydureon Jardiance until recently  Her blood sugars are totally out of control with recent readings 269 on an average and probably worse stopping Jardiance She is currently insulin resistant however not on any insulin sensitizer like metformin which previously had caused GI side effects  She does however take only small amounts of mealtime insulin Fasting reading recently 300+3 She does have difficulty losing weight and at times not able to follow her diet especially overnight with history of insomnia  OBESITY: She is interested in bariatric surgery and is going to talk to her PCP about gastric sleeve  HYPERLIPIDEMIA: LDL is 107 with Crestor However her triglycerides are high from poor diabetes control and obesity  She has a history of markedly increased lipids and not on any treatment  Abnormal LFTs: Likely to be from fatty liver as this is still persistent Stable  Possible sleep apnea: She has significant symptoms and needs to follow-up with PCP   PLAN:     She will be given a trial of HUMULIN R U-500 insulin  Discussed in detail how this is different from normal insulin and set of action, duration of action and concentrated nature  She was also instructed in detail on how to use the Humulin R U-500 insulin syringes to drop the insulin  Since she is not checking her sugars consistently will need to start with a relatively low dose of 10 units twice a day which will be the equivalent of 50 units of U-100 insulin  She will increase her dose next week if fasting or suppertime readings are still over 200 cost which insulin to increase  She will stay on Lantus and Bydureon  She can stop Jardiance because of recalcitrant yeast infections which are better now without it  She will be given a trial of metformin extended release since she has not done this 500 mg at dinnertime and gradually increasing the dose until she reaches a maximally tolerated  amount  Also showed her  the use of the Omnipod insulin pump and discussed how this would be useful interested in trying this, will get her insurance approval started  Needs consistent diet and exercise  LIPIDS: Since LDL is nearly 100 she can continue Crestor 5 mg for now  Patient Instructions  Start taking Metformin 500 mg, 1 tablet with your main meal for 5-7 days.  Occasionally this may initially cause loose stools or nausea.  If  tolerating well after 5 days add a second Metformin tablet (500 mg) at the same time.  Continue adding another tablet after 5 days days if no persistent nausea or diarrhea until reaching the maximum tolerated dose or the full dose of 4 tablets once daily.  Humulin R: 15  units am and ac dinner  Go up on AM dose of R by 5 U if sugar is high BEFORE SUPPER AND PM DOSE IF AM  sugar IS >200  LUNCH DOSE 10 UNITS  LANTUS SAME        Reather Littler 01/14/2018, 2:17 PM   Note: This office note was prepared with Dragon voice recognition system technology. Any transcriptional errors that result from this process are unintentional.

## 2018-01-14 NOTE — Patient Instructions (Addendum)
Start taking Metformin 500 mg, 1 tablet with your main meal for 5-7 days.  Occasionally this may initially cause loose stools or nausea.  If  tolerating well after 5 days add a second Metformin tablet (500 mg) at the same time.  Continue adding another tablet after 5 days days if no persistent nausea or diarrhea until reaching the maximum tolerated dose or the full dose of 4 tablets once daily.  Humulin R: 15  units am and ac dinner  Go up on AM dose of R by 5 U if sugar is high BEFORE SUPPER AND PM DOSE IF AM  sugar IS >200  LUNCH DOSE 10 UNITS  LANTUS SAME

## 2018-01-14 NOTE — Progress Notes (Signed)
Subjective:    Patient ID: Kimberly [name], female    DOB: 03-20-1971, 47 y.o.   MRN: [id number]  HPI Patient is now seeing a endocrinologist to manage her diabetes.  Her concern today is uncontrolled depression.  She states that ever since she has been home from jail, her depression has been worsening.  She is taking Lexapro 10 mg a day in addition to Wellbutrin 150 mg p.o. daily.  She reports anhedonia.  She reports feeling sad all the time.  She has trouble sleeping.  At night she is taking Xanax in addition to Benadryl just to try to help her go to sleep and "turn off her mind".  She reports poor energy, trouble concentrating, poor motivation.  She denies any suicidal ideation.  She denies any mania, increased goal driven severe, tangential thoughts, pressured speech, impulsive behavior.  She is having frequent panic attacks now.  For instance yesterday, she suddenly became overwhelmed in her grocery store.  She felt hot all over.  She started crying for no reason.  She ran into the bathroom and stayed in the bathroom for several hours afraid to leave sobbing. Past Medical History:  Diagnosis Date  . Abnormal vaginal Pap smear    tx with cryotherapy  . Anxiety   . Depression   . Diabetes mellitus without complication (HCC)   . History of kidney stones    passed stone - no surgery  . Hypertension   . Migraine    otc med prn  . Obesity   . Pseudotumor cerebri   . SVD (spontaneous vaginal delivery)    x 3  . Tension headache    Past Surgical History:  Procedure Laterality Date  . ABDOMINAL HYSTERECTOMY    . ANTERIOR CERVICAL DECOMP/DISCECTOMY FUSION N/A 01/03/2016   Procedure: ANTERIOR CERVICAL DECOMPRESSION FUSION CERVICAL FIVE-SIX,CERVICAL SIX-SEVEN.;  Surgeon: Lisbeth Renshaw, MD;  Location: [facility];  Service: Neurosurgery;  Laterality: N/A;  right side approach  . CRYOTHERAPY    . LAPAROSCOPIC ASSISTED VAGINAL HYSTERECTOMY Bilateral 04/21/2014   Procedure: LAPAROSCOPIC  ASSISTED VAGINAL HYSTERECTOMY, BILATERAL SALPINGECTOMY;  Surgeon: Lavina Hamman, MD;  Location: [facility];  Service: Gynecology;  Laterality: Bilateral;  . MANDIBLE FRACTURE SURGERY  2002   x 1  . TUBAL LIGATION  11/2003  . WISDOM TOOTH EXTRACTION     Current Outpatient Medications on File Prior to Visit  Medication Sig Dispense Refill  . acetaZOLAMIDE (DIAMOX) 250 MG tablet TAKE 1 TABLET (250 MG TOTAL) BY MOUTH 2 (TWO) TIMES DAILY. 60 tablet 3  . ALPRAZolam (XANAX) 1 MG tablet TAKE 1 TABLET BY MOUTH THREE TIMES A DAY AS NEEDED 90 tablet 2  . atenolol (TENORMIN) 100 MG tablet TAKE ONE TABLET BY MOUTH ONCE DAILY 30 tablet 9  . Blood Glucose Monitoring Suppl (ACCU-CHEK GUIDE ME) w/Device KIT 1 each by Does not apply route daily. 1 kit 0  . buPROPion (WELLBUTRIN SR) 150 MG 12 hr tablet Take 1 tablet (150 mg total) by mouth daily. 30 tablet 5  . cetirizine (ZYRTEC) 10 MG tablet Take 1 tablet (10 mg total) by mouth daily. 30 tablet 11  . escitalopram (LEXAPRO) 10 MG tablet TAKE 1 TABLET (10 MG TOTAL) BY MOUTH DAILY. 30 tablet 6  . Exenatide ER (BYDUREON) 2 MG PEN INJECT 2 MG INTO THE SKIN ONCE A WEEK. 4 each 2  . fluticasone (FLONASE) 50 MCG/ACT nasal spray Place 2 sprays into both nostrils daily. 16 g 6  . glucose blood (ACCU-CHEK GUIDE)  test strip Use as instructed to test blood sugar 5 times daily. 100 each 12  . HUMALOG KWIKPEN 100 UNIT/ML KiwkPen INJECT 15 UNITS INTO THE SKIN 3 TIMES A DAY 5 pen 3  . Insulin Glargine (LANTUS SOLOSTAR) 100 UNIT/ML Solostar Pen Inject 60 units in the am and 50 units in the pm 15 pen 3  . Insulin Pen Needle 31G X 8 MM MISC Use with pen QD 100 each 3  . insulin regular human CONCENTRATED (HUMULIN R) 500 UNIT/ML injection 15 units before breakfast and dinner and 10 units before lunch adjust as directed 10 mL 0  . Lancets Misc. (ACCU-CHEK FASTCLIX LANCET) KIT 1 each by Does not apply route daily. Use to check blood sugar 5 times daily. 1 kit 12  . metFORMIN  (GLUCOPHAGE-XR) 500 MG 24 hr tablet Take 4 tablets (2,000 mg total) by mouth daily with supper. 120 tablet 3  . rosuvastatin (CRESTOR) 5 MG tablet Take 1 tablet (5 mg total) by mouth daily. 30 tablet 3   No current facility-administered medications on file prior to visit.    No Known Allergies Social History   Socioeconomic History  . Marital status: Divorced    Spouse name: Not on file  . Number of children: Not on file  . Years of education: Not on file  . Highest education level: Not on file  Occupational History  . Not on file  Social Needs  . Financial resource strain: Not on file  . Food insecurity:    Worry: Not on file    Inability: Not on file  . Transportation needs:    Medical: Not on file    Non-medical: Not on file  Tobacco Use  . Smoking status: Never Smoker  . Smokeless tobacco: Never Used  Substance and Sexual Activity  . Alcohol use: Yes    Alcohol/week: 0.6 oz    Types: 1 Glasses of wine per week    Comment: one glass of wine per week  . Drug use: No  . Sexual activity: Yes    Birth control/protection: Surgical  Lifestyle  . Physical activity:    Days per week: Not on file    Minutes per session: Not on file  . Stress: Not on file  Relationships  . Social connections:    Talks on phone: Not on file    Gets together: Not on file    Attends religious service: Not on file    Active member of club or organization: Not on file    Attends meetings of clubs or organizations: Not on file    Relationship status: Not on file  . Intimate partner violence:    Fear of current or ex partner: Not on file    Emotionally abused: Not on file    Physically abused: Not on file    Forced sexual activity: Not on file  Other Topics Concern  . Not on file  Social History Narrative  . Not on file     Review of Systems  All other systems reviewed and are negative.      Objective:   Physical Exam  Constitutional: She appears well-developed and well-nourished.  No distress.  Eyes: Conjunctivae are normal.  Neck: Neck supple.  Cardiovascular: Normal rate, regular rhythm and normal heart sounds.  Pulmonary/Chest: Effort normal and breath sounds normal. No respiratory distress. She has no wheezes. She has no rales.  Lymphadenopathy:    She has no cervical adenopathy.  Skin: She is not diaphoretic.  Vitals reviewed.         Assessment & Plan:  Depression, major, single episode, moderate (HCC)  I recommended discontinuing Lexapro and replacing it with Trintellix 10 mg a day.  Continue Wellbutrin at the present time.  Reassess the patient in 3 to 4 weeks.  Hopefully at that time she will be seeing improvement with regards to her depression.  If not we may up the Trintellix to 20 mg a day.  We also discussed using Rexulti versus Abilify but I would like to avoid these medications due to weight gain.

## 2018-01-16 ENCOUNTER — Other Ambulatory Visit: Payer: Self-pay

## 2018-01-16 MED ORDER — INSULIN REGULAR HUMAN (CONC) 500 UNIT/ML ~~LOC~~ SOLN: SUBCUTANEOUS | 0 refills | Status: DC

## 2018-01-19 NOTE — Progress Notes (Signed)
Patient was shown the U-500 R vial and how to draw up 10u.  She re demonstrate this correctly, using a U-500 syringe.  We discussed the timing of this insulin and the need to take this30 min. Before meals.  She reported good understanding of this with no final questions.  We discussed the insulin dose adjustment schedule and written instructions were given to her by Dr. Lucianne MussKumar.  We reviewed this, and she reported good understanding of this.  She had no final questions. She was shown the OmniPod pump, and she agreed that she would like this.  Paperwork was filled out and faxed in, for insurance investigation.  She had no final questions.

## 2018-01-22 ENCOUNTER — Telehealth: Payer: Self-pay | Admitting: Endocrinology

## 2018-01-22 ENCOUNTER — Other Ambulatory Visit: Payer: Self-pay

## 2018-01-22 MED ORDER — INSULIN SYRINGE/NEEDLE U-500 31G X 6MM 0.5 ML MISC: 1.0000 | Freq: Every day | 3 refills | Status: DC

## 2018-01-22 NOTE — Telephone Encounter (Signed)
Rx sent 

## 2018-01-22 NOTE — Telephone Encounter (Signed)
Pt picked up Humalin but there was no RX for the needles that go with it. [facility] [place]

## 2018-01-23 ENCOUNTER — Other Ambulatory Visit: Payer: Self-pay | Admitting: Family Medicine

## 2018-02-09 ENCOUNTER — Other Ambulatory Visit: Payer: Self-pay | Admitting: Family Medicine

## 2018-02-10 ENCOUNTER — Telehealth: Payer: Self-pay | Admitting: Nutrition

## 2018-02-10 NOTE — Telephone Encounter (Signed)
Message left on machine that we need blood work done, and to call for a lab appointment.

## 2018-02-12 ENCOUNTER — Other Ambulatory Visit (INDEPENDENT_AMBULATORY_CARE_PROVIDER_SITE_OTHER): Payer: Medicaid Other

## 2018-02-12 ENCOUNTER — Other Ambulatory Visit: Payer: Self-pay | Admitting: Family Medicine

## 2018-02-12 ENCOUNTER — Other Ambulatory Visit: Payer: Self-pay | Admitting: Endocrinology

## 2018-02-12 DIAGNOSIS — E1165 Type 2 diabetes mellitus with hyperglycemia: Secondary | ICD-10-CM

## 2018-02-12 DIAGNOSIS — Z794 Long term (current) use of insulin: Secondary | ICD-10-CM

## 2018-02-12 LAB — BASIC METABOLIC PANEL
BUN: 17 mg/dL (ref 6–23)
CO2: 24 mEq/L (ref 19–32)
Calcium: 9.6 mg/dL (ref 8.4–10.5)
Chloride: 106 mEq/L (ref 96–112)
Creatinine, Ser: 0.95 mg/dL (ref 0.40–1.20)
GFR: 66.95 mL/min (ref >60.00)
Glucose, Bld: 243 mg/dL — ABNORMAL HIGH (ref 70–99)
Potassium: 3.9 mEq/L (ref 3.5–5.1)
Sodium: 139 mEq/L (ref 135–145)

## 2018-02-12 MED ORDER — VORTIOXETINE HBR 10 MG PO TABS: 10.0000 mg | ORAL_TABLET | Freq: Every day | ORAL | 5 refills | Status: DC

## 2018-02-13 LAB — FRUCTOSAMINE: Fructosamine: 266 umol/L (ref 0–285)

## 2018-02-17 ENCOUNTER — Telehealth: Payer: Self-pay | Admitting: Nutrition

## 2018-02-17 NOTE — Telephone Encounter (Signed)
Patient was told that her blood sugar was over 250mg . When the blood work was drawn, and show we were unable to draw the C-Peptide.  She was told to call us, when her fbs is below 200, and we will work her into the lab for the blood work.  She agreed to do this.

## 2018-02-19 ENCOUNTER — Telehealth: Payer: Self-pay | Admitting: Family Medicine

## 2018-02-19 MED ORDER — VORTIOXETINE HBR 10 MG PO TABS: 10.0000 mg | ORAL_TABLET | Freq: Every day | ORAL | 5 refills | Status: DC

## 2018-02-19 NOTE — Telephone Encounter (Signed)
PA Submitted through [employer] and received the following:  Approved - pharmacy and pt aware

## 2018-02-25 ENCOUNTER — Other Ambulatory Visit: Payer: Self-pay | Admitting: Endocrinology

## 2018-02-25 DIAGNOSIS — E1165 Type 2 diabetes mellitus with hyperglycemia: Secondary | ICD-10-CM

## 2018-02-25 DIAGNOSIS — Z794 Long term (current) use of insulin: Principal | ICD-10-CM

## 2018-02-28 ENCOUNTER — Other Ambulatory Visit: Payer: Self-pay | Admitting: Endocrinology

## 2018-03-06 ENCOUNTER — Other Ambulatory Visit (INDEPENDENT_AMBULATORY_CARE_PROVIDER_SITE_OTHER): Payer: Medicaid Other

## 2018-03-06 DIAGNOSIS — Z794 Long term (current) use of insulin: Secondary | ICD-10-CM | POA: Diagnosis not present

## 2018-03-06 DIAGNOSIS — E1165 Type 2 diabetes mellitus with hyperglycemia: Secondary | ICD-10-CM | POA: Diagnosis not present

## 2018-03-06 LAB — GLUCOSE, RANDOM: Glucose, Bld: 246 mg/dL — ABNORMAL HIGH (ref 70–99)

## 2018-03-07 LAB — FRUCTOSAMINE: Fructosamine: 312 umol/L — ABNORMAL HIGH (ref 0–285)

## 2018-03-07 LAB — C-PEPTIDE: C-Peptide: 4.4 ng/mL (ref 1.1–4.4)

## 2018-03-09 NOTE — Progress Notes (Deleted)
No show

## 2018-03-10 ENCOUNTER — Encounter: Payer: Self-pay | Admitting: Endocrinology

## 2018-03-10 ENCOUNTER — Ambulatory Visit: Payer: Medicaid Other | Admitting: Endocrinology

## 2018-03-10 DIAGNOSIS — Z0289 Encounter for other administrative examinations: Secondary | ICD-10-CM

## 2018-03-12 ENCOUNTER — Other Ambulatory Visit: Payer: Self-pay

## 2018-03-12 ENCOUNTER — Ambulatory Visit (INDEPENDENT_AMBULATORY_CARE_PROVIDER_SITE_OTHER): Payer: Medicaid Other | Admitting: Endocrinology

## 2018-03-12 ENCOUNTER — Encounter: Payer: Self-pay | Admitting: Endocrinology

## 2018-03-12 ENCOUNTER — Telehealth: Payer: Self-pay | Admitting: Endocrinology

## 2018-03-12 ENCOUNTER — Other Ambulatory Visit: Payer: Self-pay | Admitting: Endocrinology

## 2018-03-12 VITALS — BP 122/68 | HR 68 | Resp 20 | Ht 61.0 in | Wt 270.0 lb

## 2018-03-12 DIAGNOSIS — E1165 Type 2 diabetes mellitus with hyperglycemia: Secondary | ICD-10-CM

## 2018-03-12 DIAGNOSIS — Z794 Long term (current) use of insulin: Secondary | ICD-10-CM

## 2018-03-12 MED ORDER — V-GO 20 KIT: 1.0000 | PACK | Freq: Every day | 0 refills | Status: DC

## 2018-03-12 NOTE — Progress Notes (Signed)
Patient ID: Kimberly [name], female   DOB: 1970/08/07, 47 y.o.   MRN: [id number]          Reason for Appointment: Follow-up for Type 2 Diabetes  Referring physician: Lynnea Ferrier   History of Present Illness:          Date of diagnosis of type 2 diabetes mellitus: 12/2014   Background history:   She was initially tried on metformin but because of side effects as was stopped and she was tried on glipizide and subsequently [place].  She apparently took Jardiance for at least a couple of months but because of recurrent yeast infection she stopped it and she did not think it was helping her glucose also He was started on Lantus insulin in 12/2015 Subsequently Bydureon was added in 03/2016  Recent history:   INSULIN regimen is:   Lantus insulin  50 bid, Humulin R U-500 insulin 15 units ac  in syringe   Non-insulin hypoglycemic drugs the patient is taking are: Bydureon 2 mg weekly, metformin ER 500 mg twice daily  Most recent A1c is 8.9, previously 9.2 Fructosamine is higher than before at 312  Current management, blood sugar patterns and problems identified:   She has not been taking her Jardiance since about early July because of frequent vaginal yeast infections  Instead of Humalog she is now taking Humulin R U-500 insulin  However even though she was started on 15 units twice daily and 10 units at lunchtime and told to increase her dose of her high sugar she is still taking about the same dose  Also frequently at work she will not take her insulin because 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  Again checking blood sugars infrequently  FASTING blood sugars are significantly high but variable  Blood sugars are still well over 200 in the afternoon and evenings  She thinks she is taking her Lantus consistently but her Humulin R is being taken about 10 to 15 minutes before starting to eat  She was supposed to look into the insulin pump because of poor  control with the injection but her C-peptide level is upper normal  Her insurance denied Ozempic and Victoza and she is still on Bydureon  Again is having difficulty losing weight with very little exercise        Side effects from medications have been: Nausea and diarrhea from  Hi dose metformin  Glucose monitoring:  About 1 times a day         Glucometer:  Accu-Chek .      Blood Glucose readings by time of day from review of monitor  Blood sugar range to 25-423 with AVERAGE 293  Average blood sugar between 288 and 308 at different times, highest fasting   Self-care:  Typical meal intake: Breakfast is   bagel, scrambled egg/oatmeal, juice    Lunches sandwich or leftovers Dinner is variable, sometimes only cereal Snacks will be yogurt or chips            Dietician visit, most recent: 08/2017               Exercise:  some walking at times  Weight history:  Wt Readings from Last 3 Encounters:  03/12/18 270 lb (122.5 kg)  01/14/18 275 lb (124.7 kg)  01/14/18 273 lb 6.4 oz (124 kg)    `Glycemic control:   Lab Results  Component Value Date   HGBA1C 8.9 (H) 01/09/2018   HGBA1C  9.2 08/02/2017   HGBA1C 10.9 (H) 01/10/2017   Lab Results  Component Value Date   MICROALBUR 0.3 03/23/2016   LDLCALC 250 (H) 10/28/2015   CREATININE 0.95 02/12/2018   No results found for: [facility]  Lab Results  Component Value Date   FRUCTOSAMINE 312 (H) 03/06/2018   FRUCTOSAMINE 266 02/12/2018   FRUCTOSAMINE 266 09/02/2017      Allergies as of 03/12/2018   No Known Allergies     Medication List        Accurate as of 03/12/18  3:26 PM. Always use your most recent med list.          ACCU-CHEK FASTCLIX LANCET Kit 1 each by Does not apply route daily. Use to check blood sugar 5 times daily.   ACCU-CHEK GUIDE ME w/Device Kit 1 each by Does not apply route daily.   acetaZOLAMIDE 250 MG tablet Commonly known as:  DIAMOX TAKE 1 TABLET (250 MG TOTAL) BY MOUTH 2 (TWO) TIMES  DAILY.   ALPRAZolam 1 MG tablet Commonly known as:  XANAX TAKE 1 TABLET BY MOUTH THREE TIMES A DAY AS NEEDED   atenolol 100 MG tablet Commonly known as:  TENORMIN TAKE ONE TABLET BY MOUTH ONCE DAILY   buPROPion 150 MG 12 hr tablet Commonly known as:  WELLBUTRIN SR TAKE 1 TABLET BY MOUTH EVERY DAY   cetirizine 10 MG tablet Commonly known as:  ZYRTEC Take 1 tablet (10 mg total) by mouth daily.   Exenatide ER 2 MG Pen INJECT 2 MG INTO THE SKIN ONCE A WEEK.   fluticasone 50 MCG/ACT nasal spray Commonly known as:  FLONASE Place 2 sprays into both nostrils daily.   glucose blood test strip Use as instructed to test blood sugar 5 times daily.   Insulin Glargine 100 UNIT/ML Solostar Pen Commonly known as:  LANTUS Inject 60 units in the am and 50 units in the pm   Insulin Pen Needle 31G X 8 MM Misc Use with pen QD   insulin regular human CONCENTRATED 500 UNIT/ML injection Commonly known as:  HUMULIN R 15 units before breakfast and dinner and 10 units before lunch adjust as directed   Insulin Syringe/Needle U-500 31G X 0.5 ML Misc 1 each by Does not apply route daily. Use to inject insulin 3 times daily.   metFORMIN 500 MG 24 hr tablet Commonly known as:  GLUCOPHAGE-XR Take 4 tablets (2,000 mg total) by mouth daily with supper.   rosuvastatin 5 MG tablet Commonly known as:  CRESTOR TAKE 1 TABLET BY MOUTH EVERY DAY   V-GO 20 Kit Inject 1 kit into the skin daily.   vortioxetine HBr 10 MG Tabs tablet Commonly known as:  TRINTELLIX Take 1 tablet (10 mg total) by mouth daily.       Allergies: No Known Allergies  Past Medical History:  Diagnosis Date  . Abnormal vaginal Pap smear    tx with cryotherapy  . Anxiety   . Depression   . Diabetes mellitus without complication (HCC)   . History of kidney stones    passed stone - no surgery  . Hypertension   . Migraine    otc med prn  . Obesity   . Pseudotumor cerebri   . SVD (spontaneous vaginal delivery)     x 3  . Tension headache     Past Surgical History:  Procedure Laterality Date  . ABDOMINAL HYSTERECTOMY    . ANTERIOR CERVICAL DECOMP/DISCECTOMY FUSION N/A 01/03/2016   Procedure: ANTERIOR CERVICAL  DECOMPRESSION FUSION CERVICAL FIVE-SIX,CERVICAL SIX-SEVEN.;  Surgeon: Lisbeth Renshaw, MD;  Location: [facility];  Service: Neurosurgery;  Laterality: N/A;  right side approach  . CRYOTHERAPY    . LAPAROSCOPIC ASSISTED VAGINAL HYSTERECTOMY Bilateral 04/21/2014   Procedure: LAPAROSCOPIC ASSISTED VAGINAL HYSTERECTOMY, BILATERAL SALPINGECTOMY;  Surgeon: Lavina Hamman, MD;  Location: [facility];  Service: Gynecology;  Laterality: Bilateral;  . MANDIBLE FRACTURE SURGERY  2002   x 1  . TUBAL LIGATION  11/2003  . WISDOM TOOTH EXTRACTION      Family History  Problem Relation Age of Onset  . Hypertension Mother   . Diabetes Mother   . Diabetes Sister   . Hypertension Sister     Social History:  reports that she has never smoked. She has never used smokeless tobacco. She reports that she drinks about 1.0 standard drinks of alcohol per week. She reports that she does not use drugs.   Review of Systems   Lipid history: Taking Crestor 5 mg daily  Triglycerides are significantly high, LDL 107    Lab Results  Component Value Date   CHOL 188 01/09/2018   HDL 29.90 (L) 01/09/2018   LDLCALC 250 (H) 10/28/2015   LDLDIRECT 107.0 01/09/2018   TRIG (H) 01/09/2018    454.0 Triglyceride is over 400; calculations on Lipids are invalid.   CHOLHDL 6 01/09/2018       Has mild increase in ALT, chronic     Lab Results  Component Value Date   ALT 53 (H) 01/09/2018   Hypertension: This is being treated with atenolol 100 mg only by her PCP  BP Readings from Last 3 Encounters:  03/12/18 122/68  01/14/18 128/74  01/14/18 116/80    Most recent eye exam was in 2017  Most recent foot exam:2/19      LABS:  Lab on 03/06/2018  Component Date Value Ref Range Status  . Glucose, Bld 03/06/2018  246* 70 - 99 mg/dL Final  . C-Peptide 91/47/8295 4.4  1.1 - 4.4 ng/mL Final   C-Peptide reference interval is for fasting patients.  . Fructosamine 03/06/2018 312* 0 - 285 umol/L Final   Comment: Published reference interval for apparently healthy subjects between age 77 and 39 is 65 - 285 umol/L and in a poorly controlled diabetic population is 228 - 563 umol/L with a mean of 396 umol/L.     Physical Examination:  BP 122/68 (BP Location: Left Arm, Patient Position: Sitting, Cuff Size: Normal)   Pulse 68   Resp 20   Ht 5\' 1"  (1.549 m)   Wt 270 lb (122.5 kg)   LMP 09/23/2013   SpO2 97%   BMI 51.02 kg/m        ASSESSMENT:  Diabetes type 2, uncontrolled with morbid obesity     She has had persistently high A1c since her diagnosis in 2016, last 8.9  Currently on a regimen of basal bolus insulin, Bydureon and metformin low-dose  Even with starting the Humulin R U-500 her blood sugars are not any better and may be somewhat worse with stopping Jardiance which she cannot tolerate also Cannot tolerate more than 1000 mg of metformin daily Also has difficulty being consistent with diet and exercise Currently not qualifying for the Omnipod or Medtronic pumps because of C-peptide not being low   PLAN:     She was shown the V-go pump and how this would work  This will be much more effective than insulin injections especially with her multiple doses and difficulty with compliance  with her lunchtime dose  Meanwhile she will increase her Humulin R to twice the amount in the morning and take 30 units but continue 20 units at suppertime  Discussed that if she is still sees blood sugars consistently over 200 she can go up by 5 units every week  Also increase evening Lantus by 10 units  She will look into the V-go pump and if approved will go ahead and start her training  Also needs to check her sugars more consistently  Patient Instructions  Try 30 units am and supper and 20 at  lunch  Add 5 units per week till sugars < 200  lantus 60 units pm, 50 in am  Counseling time on subjects discussed in assessment and plan sections is over 50% of today's 25 minute visit    Reather Littler 03/12/2018, 3:26 PM   Note: This office note was prepared with Dragon voice recognition system technology. Any transcriptional errors that result from this process are unintentional.

## 2018-03-12 NOTE — Telephone Encounter (Signed)
Can you call pt to discuss her pump?

## 2018-03-12 NOTE — Telephone Encounter (Signed)
Patient stated that the pharmacy has contacted us to refill her  Insulin Glargine (LANTUS SOLOSTAR) 100 UNIT/ML Solostar Pen   Patient also would like a call back to discuss about her pump that she should be receiving   Please advise

## 2018-03-12 NOTE — Telephone Encounter (Signed)
Has appt today will send in at appt/thx dmf

## 2018-03-12 NOTE — Patient Instructions (Addendum)
Try 30 units am and supper and 20 at lunch  Add 5 units per week till sugars < 200  lantus 60 units pm, 50 in am

## 2018-03-13 NOTE — Telephone Encounter (Signed)
Pt. Was seen and was shown the V-Go. Insurance investigation form filled out and faxed to [employer].  Prescription was put on Dr Remus BlakeKumar's desk for signature and needs to be faxed to [facility].  Fax cover sheet included in paperwork

## 2018-03-14 ENCOUNTER — Telehealth: Payer: Self-pay | Admitting: Endocrinology

## 2018-03-14 NOTE — Telephone Encounter (Signed)
Tammy from [facility] is calling in regards to patients pump prescription that was sent They wanted an up to date phone number and for us to give them patients medicaid number.  I confirmed the phone number was the same as our but I did not feel comfortable giving them the medicaid number without the patients permission. Tammy then told me "so you are denying the patients prescription"  I told her "no ma'am, I just do not feel comfortable giving that out without the patient telling me that this is okay to do so" She asked for my name and ended the call  Just an FYI so if they happen to call back and speak to the manager,  Or if it comes up from someone else she speaks with.

## 2018-03-15 ENCOUNTER — Other Ambulatory Visit: Payer: Self-pay | Admitting: Endocrinology

## 2018-03-18 ENCOUNTER — Encounter: Payer: Medicaid Other | Attending: Endocrinology | Admitting: Nutrition

## 2018-03-18 DIAGNOSIS — Z794 Long term (current) use of insulin: Secondary | ICD-10-CM | POA: Insufficient documentation

## 2018-03-18 DIAGNOSIS — E1165 Type 2 diabetes mellitus with hyperglycemia: Secondary | ICD-10-CM | POA: Insufficient documentation

## 2018-03-18 DIAGNOSIS — Z713 Dietary counseling and surveillance: Secondary | ICD-10-CM | POA: Insufficient documentation

## 2018-03-19 ENCOUNTER — Ambulatory Visit: Payer: Medicaid Other | Admitting: Endocrinology

## 2018-03-19 NOTE — Telephone Encounter (Signed)
error 

## 2018-03-20 ENCOUNTER — Ambulatory Visit: Payer: Medicaid Other | Admitting: Endocrinology

## 2018-03-20 ENCOUNTER — Other Ambulatory Visit: Payer: Self-pay | Admitting: Family Medicine

## 2018-03-20 NOTE — Telephone Encounter (Signed)
Ok to refill??  Last office visit 01/14/2018.  Last refill 12/24/2017, #2 refills.

## 2018-03-21 ENCOUNTER — Ambulatory Visit: Payer: Medicaid Other | Admitting: Endocrinology

## 2018-03-21 MED ORDER — INSULIN GLARGINE 100 UNIT/ML SOLOSTAR PEN: PEN_INJECTOR | SUBCUTANEOUS | 1 refills | Status: DC

## 2018-03-21 NOTE — Telephone Encounter (Signed)
Judeth Cornfield to arrange for training when Pilar Jarvis is available

## 2018-03-21 NOTE — Telephone Encounter (Signed)
Pt. Is calling wanting to reschedule pump training with Bonita Quin and Dr. Lucianne Muss from 9/24. Informed pt we are not able to do that scheduling and that Bonita Quin will be out. Pt. only has enough pins to last her till 9/28. Please refill before wkend. [facility] [address]   Please advice. Ph # [phone]

## 2018-03-21 NOTE — Telephone Encounter (Signed)
She needs to first make a commitment to coming for her appointments. Was not aware that Kimberly [name] can do her training since she is not from the [employer].  The V-go company representative cannot training with U-500 insulin   She will need to be seen on the second and third day after the pump is started

## 2018-03-21 NOTE — Telephone Encounter (Signed)
Sent in refill for pts Lantus, pt is aware that I nor the front office can reschedule her missed pump start appointments. Patient is wanting to know when she can come back in for this. Kimberly [name] is out of the office the month of October. Kimberly [name] is not able to do the pump start. Kimberly [name] (Omnipod Rep) upstairs is able but she would have to be seen upstairs she could not do the pump start in this office. Please advise.

## 2018-03-21 NOTE — Telephone Encounter (Signed)
Pt kept referring to a pump start and mentioned omnipod then said she was not sure. If it is for the V-go the [facility] can not do it.

## 2018-03-25 ENCOUNTER — Ambulatory Visit: Payer: Medicaid Other | Admitting: Family Medicine

## 2018-04-11 ENCOUNTER — Telehealth: Payer: Self-pay | Admitting: Endocrinology

## 2018-04-11 DIAGNOSIS — Z794 Long term (current) use of insulin: Principal | ICD-10-CM

## 2018-04-11 DIAGNOSIS — E119 Type 2 diabetes mellitus without complications: Secondary | ICD-10-CM

## 2018-04-11 NOTE — Telephone Encounter (Signed)
Patient needs to get on the pump because the insulin is not working very well. Please call patient at ph# [phone] to discuss. Patient's blood sugar is running 433.

## 2018-04-14 DIAGNOSIS — E119 Type 2 diabetes mellitus without complications: Secondary | ICD-10-CM | POA: Insufficient documentation

## 2018-04-14 DIAGNOSIS — Z794 Long term (current) use of insulin: Secondary | ICD-10-CM | POA: Insufficient documentation

## 2018-04-14 NOTE — Telephone Encounter (Signed)
Please see Vernona Rieger, to consider a V-GO pump.  I put in referral.  Pending that, please increase lantus to 65 units BID.

## 2018-04-14 NOTE — Telephone Encounter (Signed)
Please advise 

## 2018-04-15 ENCOUNTER — Telehealth: Payer: Self-pay | Admitting: Dietician

## 2018-04-15 NOTE — Telephone Encounter (Signed)
Kimberly [name] can you please have this patient scheduled to see

## 2018-04-15 NOTE — Telephone Encounter (Signed)
Nutrition Note:   Patient called reporting increased blood sugar of 433 last week. Stated that she was to get a pump and it had been sent to the office.   Chart reviewed.  C-peptide 4.4 9/12, glucose 246 as well on that date.  She stated that she did not meet criteria for the one pump but did not know the name.  Chart indicates that she should get a v-go.  Patient reports taking Lantus 50 units q am and 60 units q HS.  She also takes Concentrated Humulin R 500 15 units 30 minutes before each meal.  She often skips the lunch dose as she is not able to take this at work or skips lunch.  Chart indicates she is taking Metformin ER and Bydureon.  Called patient back later this afternoon after further review. Read patient instructions from Dr. Remus Blake note dated 03/12/18 that patient should try 30 units of the Humulin in the am and at supper and 20 units at lunch and add 5 units per week until sugars are <200.  Will discuss with MD on call and return patient call Thursday   Oran Rein, RD, LDN, CDE

## 2018-04-17 ENCOUNTER — Telehealth: Payer: Self-pay | Admitting: Dietician

## 2018-04-17 NOTE — Telephone Encounter (Signed)
Called patient to see what her blood glucose was post increase of her insulin.  Patient stated that things have been busy and she has not been taking the insulin as she should.  Reviewed insulin guidelines per Dr. Remus Blake note:  [place] R 30 units prior to breakfast, 20 units prior to lunch, and 30 units prior to dinner.  (take 30 minutes before meals).  Increase 5 units per week until BG <200.  Patient verbalized understanding. Discussed training on the V-go.  Patient decided to make an appointment with Bonita Quin.  She was scheduled for v-go training with Bonita Quin 11/5.  Patient to call for any questions.  Oran Rein, RD, LDN, CDE

## 2018-04-18 NOTE — Telephone Encounter (Signed)
Pt is set up for training with Kimberly [name] on vgo and u 500 for when she returns on 04/29/18

## 2018-04-18 NOTE — Telephone Encounter (Signed)
error 

## 2018-04-29 ENCOUNTER — Encounter: Payer: Medicaid Other | Attending: Endocrinology | Admitting: Nutrition

## 2018-04-29 ENCOUNTER — Telehealth: Payer: Self-pay | Admitting: Nutrition

## 2018-04-29 DIAGNOSIS — Z794 Long term (current) use of insulin: Secondary | ICD-10-CM | POA: Diagnosis not present

## 2018-04-29 DIAGNOSIS — Z713 Dietary counseling and surveillance: Secondary | ICD-10-CM | POA: Diagnosis not present

## 2018-04-29 DIAGNOSIS — E119 Type 2 diabetes mellitus without complications: Secondary | ICD-10-CM

## 2018-04-29 DIAGNOSIS — E1165 Type 2 diabetes mellitus with hyperglycemia: Secondary | ICD-10-CM | POA: Insufficient documentation

## 2018-04-30 ENCOUNTER — Ambulatory Visit: Payer: Medicaid Other | Admitting: Endocrinology

## 2018-04-30 ENCOUNTER — Telehealth: Payer: Self-pay | Admitting: Nutrition

## 2018-04-30 ENCOUNTER — Telehealth: Payer: Self-pay | Admitting: Endocrinology

## 2018-04-30 NOTE — Telephone Encounter (Signed)
Per Dr. Remus Blake voice order, Kimberly [name] was told to take her V-go off at bedtime and put a new one on in the morning.  She reverbalized this correctly, and had no questions.

## 2018-04-30 NOTE — Telephone Encounter (Signed)
Message left on machine to ask how she has done overnight with her V-Go, and if she has any questions for me.

## 2018-04-30 NOTE — Telephone Encounter (Signed)
Previous note approved

## 2018-04-30 NOTE — Progress Notes (Signed)
Patient was trained on how to fill, apply, and use the V-go, using U-500R insulin.  She re demonstrated how to fill the V-Go and applied it to her right upper, outer arm.  She was shown how to give a bolus, and told that each button press will deliver 10 u of insulin.  She reported good understanding of this. She was instructed to give 3  button presses at breakfast, 2 at lunch, and 3 at supper.  Written instructions were given for this.  She was also instructed that she will stop all Lantus insulin.  She reported good understanding of this with no final questions.  She was given a V-go 20 starter kit, with 6 pods. And told to make an appoinment with Dr. Dwyane Dee for tomorrow.  She had no final questions  Patient was very upset when she came into the office, saying that her blood sugars have been very high, and have called up here 7 times without anyone returning her call.  She was taken to [facility] office, and to file a complaint.

## 2018-04-30 NOTE — Patient Instructions (Addendum)
Stop all Lantus insulin. Fill and apply a new pod every 24 hours.  Give 3 button presses 30 min. Before breakfast, 2 before lunch, and 3 before supper. Test blood sugars before meals and at bedtime Call if blood sugars drop low, or if questions.

## 2018-04-30 NOTE — Progress Notes (Signed)
Patient ID: Kimberly [name], female   DOB: Apr 08, 1971, 47 y.o.   MRN: [id number]          Reason for Appointment: Follow-up for Type 2 Diabetes  Referring physician: Lynnea Ferrier   History of Present Illness:          Date of diagnosis of type 2 diabetes mellitus: 12/2014   Background history:   She was initially tried on metformin but because of side effects as was stopped and she was tried on glipizide and subsequently [place].  She apparently took Jardiance for at least a couple of months but because of recurrent yeast infection she stopped it and she did not think it was helping her glucose also He was started on Lantus insulin in 12/2015 Subsequently Bydureon was added in 03/2016  Recent history:   INSULIN regimen is:  V-go pump with U-500 insulin, 20 units basal Boluses 6 units with breakfast and supper and 2 units for lunch   Non-insulin hypoglycemic drugs the patient is taking are: Bydureon 2 mg weekly, metformin ER 500 mg twice daily  Most recent A1c is 8.9, previously 9.2  Current management, blood sugar patterns and problems identified:   She has started the V-go pump 2 days ago  Previously was taking 110 units of Lantus along with 80 units of the U-500 insulin  Prior to her starting the pump her blood sugars were ranging between 199 and 433 at home  As before however she has not checked her sugars as often as directed  The first night after starting the pump blood sugar was 191 at bedtime and last night was 347  Yesterday fasting was 76 and today reportedly was 241  However because of her low normal glucose yesterday morning she was told not to apply the pump during the night last night  Blood sugar after lunch was 186 but she does not have any other postprandial readings  Glucose without any breakfast today at noon was 216  She had been reportedly taking her boluses with meals as directed        Side effects from medications have been: Nausea and  diarrhea from  Hi dose metformin  Glucose monitoring:  About 1 times a day         Glucometer:  Accu-Chek .      Blood Glucose readings by time of day from review of monitor as above  AVERAGE for the last 4 weeks 267  Self-care:  Typical meal intake: Breakfast is   bagel, scrambled egg/oatmeal, juice    Lunches sandwich or leftovers Dinner is variable, sometimes only cereal Snacks will be yogurt or chips            Dietician visit, most recent: 08/2017               Exercise:  some walking at times  Weight history:  Wt Readings from Last 3 Encounters:  05/01/18 273 lb (123.8 kg)  03/12/18 270 lb (122.5 kg)  01/14/18 275 lb (124.7 kg)    `Glycemic control:   Lab Results  Component Value Date   HGBA1C 8.9 (H) 01/09/2018   HGBA1C 9.2 08/02/2017   HGBA1C 10.9 (H) 01/10/2017   Lab Results  Component Value Date   MICROALBUR 0.3 03/23/2016   LDLCALC 250 (H) 10/28/2015   CREATININE 0.95 02/12/2018   No results found for: [facility]  Lab Results  Component Value Date   FRUCTOSAMINE 312 (H) 03/06/2018   FRUCTOSAMINE 266 02/12/2018   FRUCTOSAMINE  Patient ID: Kimberly [name], female   DOB: [birth year], 47 y.o.   MRN: [id number]          Reason for Appointment: Follow-up for Type 2 Diabetes  Referring physician: Jenna Luo   History of Present Illness:          Date of diagnosis of type 2 diabetes mellitus: 12/2014   Background history:   She was initially tried on metformin but because of side effects as was stopped and she was tried on glipizide and subsequently [place].  She apparently took Jardiance for at least a couple of months but because of recurrent yeast infection she stopped it and she did not think it was helping her glucose also He was started on Lantus insulin in 12/2015 Subsequently Bydureon was added in 03/2016  Recent history:   INSULIN regimen is:  V-go pump with U-500 insulin, 20 units basal Boluses 6 units with breakfast and supper and 2 units for lunch   Non-insulin hypoglycemic drugs the patient is taking are: Bydureon 2 mg weekly, metformin ER 500 mg twice daily  Most recent A1c is 8.9, previously 9.2  Current management, blood sugar patterns and problems identified:   She has started the V-go pump 2 days ago  Previously was taking 110 units of Lantus along with 80 units of the U-500 insulin  Prior to her starting the pump her blood sugars were ranging between 199 and 433 at home  As before however she has not checked her sugars as often as directed  The first night after starting the pump blood sugar was 191 at bedtime and last night was 347  Yesterday fasting was 76 and today reportedly was 241  However because of her low normal glucose yesterday morning she was told not to apply the pump during the night last night  Blood sugar after lunch was 186 but she does not have any other postprandial readings  Glucose without any breakfast today at noon was 216  She had been reportedly taking her boluses with meals as directed        Side effects from medications have been: Nausea and  diarrhea from  Hi dose metformin  Glucose monitoring:  About 1 times a day         Glucometer:  Accu-Chek .      Blood Glucose readings by time of day from review of monitor as above  AVERAGE for the last 4 weeks 267  Self-care:  Typical meal intake: Breakfast is   bagel, scrambled egg/oatmeal, juice    Lunches sandwich or leftovers Dinner is variable, sometimes only cereal Snacks will be yogurt or chips            Dietician visit, most recent: 08/2017               Exercise:  some walking at times  Weight history:  Wt Readings from Last 3 Encounters:  05/01/18 273 lb (123.8 kg)  03/12/18 270 lb (122.5 kg)  01/14/18 275 lb (124.7 kg)    `Glycemic control:   Lab Results  Component Value Date   HGBA1C 8.9 (H) 01/09/2018   HGBA1C 9.2 08/02/2017   HGBA1C 10.9 (H) 01/10/2017   Lab Results  Component Value Date   MICROALBUR 0.3 03/23/2016   LDLCALC 250 (H) 10/28/2015   CREATININE 0.95 02/12/2018   No results found for: [facility]  Lab Results  Component Value Date   FRUCTOSAMINE 312 (H) 03/06/2018   FRUCTOSAMINE 266 02/12/2018   FRUCTOSAMINE  Patient ID: Kimberly [name], female   DOB: [birth year], 47 y.o.   MRN: [id number]          Reason for Appointment: Follow-up for Type 2 Diabetes  Referring physician: Jenna Luo   History of Present Illness:          Date of diagnosis of type 2 diabetes mellitus: 12/2014   Background history:   She was initially tried on metformin but because of side effects as was stopped and she was tried on glipizide and subsequently [place].  She apparently took Jardiance for at least a couple of months but because of recurrent yeast infection she stopped it and she did not think it was helping her glucose also He was started on Lantus insulin in 12/2015 Subsequently Bydureon was added in 03/2016  Recent history:   INSULIN regimen is:  V-go pump with U-500 insulin, 20 units basal Boluses 6 units with breakfast and supper and 2 units for lunch   Non-insulin hypoglycemic drugs the patient is taking are: Bydureon 2 mg weekly, metformin ER 500 mg twice daily  Most recent A1c is 8.9, previously 9.2  Current management, blood sugar patterns and problems identified:   She has started the V-go pump 2 days ago  Previously was taking 110 units of Lantus along with 80 units of the U-500 insulin  Prior to her starting the pump her blood sugars were ranging between 199 and 433 at home  As before however she has not checked her sugars as often as directed  The first night after starting the pump blood sugar was 191 at bedtime and last night was 347  Yesterday fasting was 76 and today reportedly was 241  However because of her low normal glucose yesterday morning she was told not to apply the pump during the night last night  Blood sugar after lunch was 186 but she does not have any other postprandial readings  Glucose without any breakfast today at noon was 216  She had been reportedly taking her boluses with meals as directed        Side effects from medications have been: Nausea and  diarrhea from  Hi dose metformin  Glucose monitoring:  About 1 times a day         Glucometer:  Accu-Chek .      Blood Glucose readings by time of day from review of monitor as above  AVERAGE for the last 4 weeks 267  Self-care:  Typical meal intake: Breakfast is   bagel, scrambled egg/oatmeal, juice    Lunches sandwich or leftovers Dinner is variable, sometimes only cereal Snacks will be yogurt or chips            Dietician visit, most recent: 08/2017               Exercise:  some walking at times  Weight history:  Wt Readings from Last 3 Encounters:  05/01/18 273 lb (123.8 kg)  03/12/18 270 lb (122.5 kg)  01/14/18 275 lb (124.7 kg)    `Glycemic control:   Lab Results  Component Value Date   HGBA1C 8.9 (H) 01/09/2018   HGBA1C 9.2 08/02/2017   HGBA1C 10.9 (H) 01/10/2017   Lab Results  Component Value Date   MICROALBUR 0.3 03/23/2016   LDLCALC 250 (H) 10/28/2015   CREATININE 0.95 02/12/2018   No results found for: [facility]  Lab Results  Component Value Date   FRUCTOSAMINE 312 (H) 03/06/2018   FRUCTOSAMINE 266 02/12/2018   FRUCTOSAMINE  for high readings she will take 2 units for readings over 204 units if over 400 except at bedtime  Detailed instructions given  She will follow-up at the end of the month  Continue Bydureon and Metformin  Patient Instructions  Keep V-go pump on for 24 hours  Check blood sugars before and 2 hours after each meal and call readings tomorrow  If blood sugar is over 200 before a meal add 1 more click and if over 300 add 2 more clicks  Do not do any extra clicks at bedtime  Call if having low blood sugars overnight  Check with the company about where  the prescription will be sent    Counseling time on subjects discussed in assessment and plan sections is over 50% of today's 25 minute visit    Reather Littler 05/01/2018, 1:38 PM   Note: This office note was prepared with Dragon voice recognition system technology. Any transcriptional errors that result from this process are unintentional.

## 2018-04-30 NOTE — Telephone Encounter (Signed)
Patient cancelled appointment for today because she could not get off work. Patient was told to call in her blood sugar num,bers for yesterday (04/29/18). Blood sugar #'s 04/29/18 Lunch=281 Dinner=218 Before bed=191 11/6- morning=76 Please call patient to advise on if she needs to adjust medication or continue with this dosage at ph# [phone]

## 2018-04-30 NOTE — Telephone Encounter (Signed)
Called pt to get her back on the schedule and relay the information below.    Spoke with her yesterday regarding booking and put her on at 8 am today, she said at that time it would not interfere with her work. Called at 822 today and cancelled the appt due to work.

## 2018-04-30 NOTE — Telephone Encounter (Signed)
Please advise 

## 2018-04-30 NOTE — Telephone Encounter (Signed)
Need to know what her basal rate settings are and will need to reduce her midnight basal rate However no other changes as yet.  She needs to be seen later today or tomorrow preferably for follow-up

## 2018-05-01 ENCOUNTER — Ambulatory Visit (INDEPENDENT_AMBULATORY_CARE_PROVIDER_SITE_OTHER): Payer: Medicaid Other | Admitting: Endocrinology

## 2018-05-01 ENCOUNTER — Encounter: Payer: Self-pay | Admitting: Endocrinology

## 2018-05-01 VITALS — BP 122/76 | HR 72 | Temp 97.7°F | Ht 61.0 in | Wt 273.0 lb

## 2018-05-01 DIAGNOSIS — E1165 Type 2 diabetes mellitus with hyperglycemia: Secondary | ICD-10-CM

## 2018-05-01 DIAGNOSIS — Z794 Long term (current) use of insulin: Secondary | ICD-10-CM | POA: Diagnosis not present

## 2018-05-01 NOTE — Patient Instructions (Signed)
Keep V-go pump on for 24 hours  Check blood sugars before and 2 hours after each meal and call readings tomorrow  If blood sugar is over 200 before a meal add 1 more click and if over 300 add 2 more clicks  Do not do any extra clicks at bedtime  Call if having low blood sugars overnight  Check with the company about where the prescription will be sent

## 2018-05-02 ENCOUNTER — Telehealth: Payer: Self-pay | Admitting: Endocrinology

## 2018-05-02 MED ORDER — V-GO 20 KIT: 1.0000 | PACK | Freq: Every day | 0 refills | Status: DC

## 2018-05-02 NOTE — Addendum Note (Signed)
Addended by: Zenovia Jordan B on: 05/02/2018 04:25 PM   Modules accepted: Orders

## 2018-05-02 NOTE — Telephone Encounter (Signed)
Per Patient calling to report blood sugars  05/01/18 After dinner BS 280 05/01/18 Before bed BS 219  05/02/18 AM 141  Call if she needs to do anything different.  Still waiting on information regarding her medicine

## 2018-05-02 NOTE — Telephone Encounter (Addendum)
After lunch today 212 After breakfast 217  Before dinner yesterday 188  For pump supplies [facility]  [phone]

## 2018-05-02 NOTE — Telephone Encounter (Signed)
Please advise 

## 2018-05-02 NOTE — Telephone Encounter (Signed)
She will need to increase her bolus clicks by 1 click at each meal.  Confirm that she is taking an extra clicks when the blood sugar is over 200.  Please print and fax the V-go 20 pump prescription on her chart to [facility], thanks

## 2018-05-02 NOTE — Telephone Encounter (Signed)
Need to know what her blood sugars were after lunch, before dinner yesterday and after breakfast and before or after lunch today.  Has she found out where her pump supplies will need to be ordered from?

## 2018-05-02 NOTE — Telephone Encounter (Signed)
Spoke with pt to inform. RX faxed for insulin pump.

## 2018-05-02 NOTE — Telephone Encounter (Signed)
Opened in error

## 2018-05-08 ENCOUNTER — Ambulatory Visit (HOSPITAL_COMMUNITY)
Admission: EM | Admit: 2018-05-08 | Discharge: 2018-05-08 | Disposition: A | Discharge status: Home / Self Care | Payer: Medicaid Other | Attending: Family Medicine | Admitting: Family Medicine

## 2018-05-08 ENCOUNTER — Encounter (HOSPITAL_COMMUNITY): Payer: Self-pay | Admitting: Emergency Medicine

## 2018-05-08 ENCOUNTER — Ambulatory Visit (INDEPENDENT_AMBULATORY_CARE_PROVIDER_SITE_OTHER): Payer: Medicaid Other

## 2018-05-08 DIAGNOSIS — J181 Lobar pneumonia, unspecified organism: Secondary | ICD-10-CM

## 2018-05-08 DIAGNOSIS — J189 Pneumonia, unspecified organism: Secondary | ICD-10-CM

## 2018-05-08 DIAGNOSIS — R05 Cough: Secondary | ICD-10-CM | POA: Diagnosis not present

## 2018-05-08 MED ORDER — BENZONATATE 100 MG PO CAPS: 100.0000 mg | ORAL_CAPSULE | Freq: Three times a day (TID) | ORAL | 0 refills | Status: DC

## 2018-05-08 MED ORDER — AZITHROMYCIN 250 MG PO TABS: 250.0000 mg | ORAL_TABLET | Freq: Every day | ORAL | 0 refills | Status: DC

## 2018-05-08 NOTE — Discharge Instructions (Signed)
We will go ahead and treat the pneumonia with azithromycin You can continue the Mucinex Tessalon Perles for cough Tylenol or ibuprofen for pain/fever If you develop any worsening cough, shortness of breath despite treatment please follow-up

## 2018-05-08 NOTE — ED Provider Notes (Signed)
[facility]    CSN: [id number] Arrival date & time: 05/08/18  1047     History   Chief Complaint Chief Complaint  Patient presents with  . Cough    HPI Kimberly [name] is a 47 y.o. female.   Patient is a 47 year old female who presents with approximately 2 weeks of cough.  The cough is been constant and worsening.  Reports that when the cough started she is coughing up mucus and now it is more of a dry cough.  She is having some chest discomfort with coughing and breathing.  She denies any current associated nasal congestion, ear pain, sore throat, fever, chills.  She has been using over-the-counter Mucinex without much relief.  She denies any recent traveling or recent sick contacts.  ROS per HPI      Past Medical History:  Diagnosis Date  . Abnormal vaginal Pap smear    tx with cryotherapy  . Anxiety   . Depression   . Diabetes mellitus without complication ([place])   . History of kidney stones    passed stone - no surgery  . Hypertension   . Migraine    otc med prn  . Obesity   . Pseudotumor cerebri   . SVD (spontaneous vaginal delivery)    x 3  . Tension headache     Patient Active Problem List   Diagnosis Date Noted  . Diabetes ([place]) 04/14/2018  . 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 Abnormal uterine bleeding unrelated to menstrual cycle 04/21/2014  . Anxiety   . Depression   . Pseudotumor cerebri   . Hypertension   . Obesity     Past Surgical History:  Procedure Laterality Date  . ABDOMINAL HYSTERECTOMY    . ANTERIOR CERVICAL DECOMP/DISCECTOMY FUSION N/A 01/03/2016   Procedure: ANTERIOR CERVICAL DECOMPRESSION FUSION CERVICAL FIVE-SIX,CERVICAL SIX-SEVEN.;  Surgeon: Consuella Lose, MD;  Location: [facility];  Service: Neurosurgery;  Laterality: N/A;  right side approach  . CRYOTHERAPY    . LAPAROSCOPIC ASSISTED VAGINAL HYSTERECTOMY Bilateral 04/21/2014   Procedure: LAPAROSCOPIC ASSISTED VAGINAL HYSTERECTOMY,  BILATERAL SALPINGECTOMY;  Surgeon: Cheri Fowler, MD;  Location: [facility];  Service: Gynecology;  Laterality: Bilateral;  . MANDIBLE FRACTURE SURGERY  2002   x 1  . TUBAL LIGATION  11/2003  . WISDOM TOOTH EXTRACTION      OB History    Gravida  3   Para  3   Term  1   Preterm  2   AB      Living  3     SAB      TAB      Ectopic      Multiple      Live Births               Home Medications    Prior to Admission medications   Medication Sig Start Date End Date Taking? Authorizing Provider  acetaZOLAMIDE (DIAMOX) 250 MG tablet TAKE 1 TABLET (250 MG TOTAL) BY MOUTH 2 (TWO) TIMES DAILY. 02/10/18   Susy Frizzle, MD  ALPRAZolam Duanne Moron) 1 MG tablet TAKE 1 TABLET BY MOUTH THREE TIMES A DAY AS NEEDED 03/20/18   Susy Frizzle, MD  atenolol (TENORMIN) 100 MG tablet TAKE ONE TABLET BY MOUTH ONCE DAILY 06/27/17   Susy Frizzle, MD  azithromycin (ZITHROMAX) 250 MG tablet Take 1 tablet (250 mg total) by mouth daily. Take first 2 tablets together, then 1 every day until finished. 05/08/18   Rozanna Box,  Lillionna Nabi A, NP  benzonatate (TESSALON) 100 MG capsule Take 1 capsule (100 mg total) by mouth every 8 (eight) hours. 05/08/18   Loura Halt A, NP  Blood Glucose Monitoring Suppl (ACCU-CHEK GUIDE ME) w/Device KIT 1 each by Does not apply route daily. 10/10/17   Elayne Snare, MD  buPROPion [facility] SR) 150 MG 12 hr tablet TAKE 1 TABLET BY MOUTH EVERY DAY 01/23/18   Susy Frizzle, MD  cetirizine (ZYRTEC) 10 MG tablet Take 1 tablet (10 mg total) by mouth daily. 07/25/17   Susy Frizzle, MD  Exenatide ER (BYDUREON) 2 MG PEN INJECT 2 MG INTO THE SKIN ONCE A WEEK. 08/15/17   Elayne Snare, MD  fluticasone [facility]) 50 MCG/ACT nasal spray Place 2 sprays into both nostrils daily. 08/09/17   Susy Frizzle, MD  glucose blood (ACCU-CHEK GUIDE) test strip Use as instructed to test blood sugar 5 times daily. 10/10/17   Elayne Snare, MD  Insulin Disposable Pump (V-GO 20) KIT Inject 1 kit into the  skin daily. 05/02/18   Elayne Snare, MD  Insulin Glargine (LANTUS SOLOSTAR) 100 UNIT/ML Solostar Pen Inject 60 units in the am and 50 units in the pm 03/21/18   Elayne Snare, MD  Insulin Pen Needle 31G X 8 MM MISC Use with pen QD 04/10/16   Susy Frizzle, MD  insulin regular human CONCENTRATED (HUMULIN R) 500 UNIT/ML injection INJECT 15 UNITS B4 BREAKFAST+DINNER+10 UNITS BEFORE LUNCH ADJUST AS DIRECTED. DISCARD AFTER 40 DAYS 03/17/18   Elayne Snare, MD  Insulin Syringe/Needle U-500 31G X 6MM 0.5 ML MISC 1 each by Does not apply route daily. Use to inject insulin 3 times daily. 01/22/18   Elayne Snare, MD  JARDIANCE 10 MG TABS tablet TAKE 1 TABLET BY MOUTH DAILY WITH BREAKFAST. 03/17/18   Elayne Snare, MD  Lancets Misc. (ACCU-CHEK FASTCLIX LANCET) KIT 1 each by Does not apply route daily. Use to check blood sugar 5 times daily. 10/10/17   Elayne Snare, MD  metFORMIN (GLUCOPHAGE-XR) 500 MG 24 hr tablet Take 4 tablets (2,000 mg total) by mouth daily with supper. 01/14/18   Elayne Snare, MD  rosuvastatin (CRESTOR) 5 MG tablet TAKE 1 TABLET BY MOUTH EVERY DAY 02/28/18   Elayne Snare, MD  vortioxetine HBr (TRINTELLIX) 10 MG TABS tablet Take 1 tablet (10 mg total) by mouth daily. 02/19/18   Susy Frizzle, MD    Family History Family History  Problem Relation Age of Onset  . Hypertension Mother   . Diabetes Mother   . Diabetes Sister   . Hypertension Sister     Social History Social History   Tobacco Use  . Smoking status: Never Smoker  . Smokeless tobacco: Never Used  Substance Use Topics  . Alcohol use: Yes    Alcohol/week: 1.0 standard drinks    Types: 1 Glasses of wine per week    Comment: one glass of wine per week  . Drug use: No     Allergies   Patient has no known allergies.   Review of Systems Review of Systems   Physical Exam Triage Vital Signs ED Triage Vitals  Enc Vitals Group     BP 05/08/18 1151 118/79     Pulse Rate 05/08/18 1151 68     Resp 05/08/18 1151 20     Temp  05/08/18 1151 98.1 F (36.7 C)     Temp Source 05/08/18 1151 Oral     SpO2 05/08/18 1151 98 %  Weight --      Height --      Head Circumference --      Peak Flow --      Pain Score 05/08/18 1150 6     Pain Loc --      Pain Edu? --      Excl. in [place]? --    No data found.  Updated Vital Signs BP 118/79 (BP Location: Left Arm)   Pulse 68   Temp 98.1 F (36.7 C) (Oral)   Resp 20   LMP 09/23/2013   SpO2 98%   Visual Acuity Right Eye Distance:   Left Eye Distance:   Bilateral Distance:    Right Eye Near:   Left Eye Near:    Bilateral Near:     Physical Exam  Constitutional: She is oriented to person, place, and time. She appears well-developed and well-nourished.  HENT:  Head: Normocephalic and atraumatic.  Right Ear: External ear normal.  Left Ear: External ear normal.  Mouth/Throat: Oropharynx is clear and moist.  Eyes: Conjunctivae are normal.  Neck: Normal range of motion.  Cardiovascular: Normal rate, regular rhythm and normal heart sounds.  Pulmonary/Chest: Effort normal and breath sounds normal. No stridor. No respiratory distress. She has no wheezes. She has no rales. She exhibits no tenderness.  Musculoskeletal: Normal range of motion.  Neurological: She is alert and oriented to person, place, and time.  Skin: Skin is warm and dry.  Psychiatric: She has a normal mood and affect.  Nursing note and vitals reviewed.    UC Treatments / Results  Labs (all labs ordered are listed, but only abnormal results are displayed) Labs Reviewed - No data to display  EKG None  Radiology Dg Chest 2 View  Result Date: 05/08/2018 CLINICAL DATA:  Cough and chest tightness for 2 weeks. EXAM: CHEST - 2 VIEW COMPARISON:  10/26/2015 FINDINGS: Indistinct airspace opacity in the right upper lobe just above the minor fissure suspicious for early pneumonia. Lower cervical plate and screw fixator. Cardiac and mediastinal margins appear normal. The left lung appears clear. No  pleural effusion. IMPRESSION: 1. Indistinct airspace opacity in the right upper lobe suspicious for early pneumonia. Followup PA and lateral chest X-ray is recommended in 3-4 weeks following trial of antibiotic therapy to ensure resolution and exclude the unlikely possibility of underlying malignancy. Electronically Signed   By: Van Clines M.D.   On: 05/08/2018 13:05    Procedures Procedures (including critical care time)  Medications Ordered in UC Medications - No data to display  Initial Impression / Assessment and Plan / UC Course  I have reviewed the triage vital signs and the nursing notes.  Pertinent labs & imaging results that were available during my care of the patient were reviewed by me and considered in my medical decision making (see chart for details).     X-ray revealed early right upper lobe pneumonia We will treat with Z-Pak and Tessalon Perles for cough Continue Mucinex Follow up as needed for continued or worsening symptoms Work not given   Final Clinical Impressions(s) / UC Diagnoses   Final diagnoses:  Community acquired pneumonia of right upper lobe of lung ([place])     Discharge Instructions     We will go ahead and treat the pneumonia with azithromycin You can continue the Mucinex Tessalon Perles for cough Tylenol or ibuprofen for pain/fever If you develop any worsening cough, shortness of breath despite treatment please follow-up    ED Prescriptions  Medication Sig Dispense Auth. Provider   azithromycin (ZITHROMAX) 250 MG tablet Take 1 tablet (250 mg total) by mouth daily. Take first 2 tablets together, then 1 every day until finished. 6 tablet Bast, Traci A, NP   benzonatate (TESSALON) 100 MG capsule Take 1 capsule (100 mg total) by mouth every 8 (eight) hours. 21 capsule Loura Halt A, NP     Controlled Substance Prescriptions [place] Controlled Substance Registry consulted? Not Applicable   Orvan July, NP 05/08/18 1318

## 2018-05-08 NOTE — ED Triage Notes (Signed)
Pt c/o cough, chest congestion x2 weeks. Taking otc meds without relief.

## 2018-05-12 ENCOUNTER — Telehealth: Payer: Self-pay | Admitting: Endocrinology

## 2018-05-12 NOTE — Telephone Encounter (Signed)
LMTCB

## 2018-05-12 NOTE — Telephone Encounter (Signed)
Per [facility] "Caller states she is needing to speak with Judeth CornfieldStephanie about insulin pumps. Please call asap."vau

## 2018-05-13 ENCOUNTER — Telehealth: Payer: Self-pay | Admitting: Endocrinology

## 2018-05-13 ENCOUNTER — Other Ambulatory Visit: Payer: Self-pay

## 2018-05-13 ENCOUNTER — Other Ambulatory Visit: Payer: Self-pay | Admitting: Family Medicine

## 2018-05-13 MED ORDER — V-GO 20 KIT: 1.0000 | PACK | Freq: Every day | 0 refills | Status: DC

## 2018-05-13 NOTE — Telephone Encounter (Signed)
Spoke with pt, let her know we had some samples ready for her. Let her know I [facility] last week and they did not inform me at that time that they do not get vgo pods, Tashia has placed the form for the vgo supplies [facility] on Dr. Remus BlakeKumar's desk just awaiting signature.  Please call the pt once this is sent to the supply company

## 2018-05-13 NOTE — Telephone Encounter (Signed)
LMTCB  Kimberly [name] spoke with [facility] once they receive the signed order the vgo supplies will ship within 1-2 days and should get to her within 3-5. Will give her 5 vgo kits to hold her until this shipment is received

## 2018-05-13 NOTE — Telephone Encounter (Signed)
"  Caller states she needs to speak with Judeth CornfieldStephanie the Print production planneroffice manager. Caller states she is calling about an insulin pump that the office is supposed to have at the front desk for the patient. Caller states she needs the pump samples as soon as possible. Alternate number [phone]"

## 2018-05-15 DIAGNOSIS — E1165 Type 2 diabetes mellitus with hyperglycemia: Secondary | ICD-10-CM | POA: Diagnosis not present

## 2018-05-19 ENCOUNTER — Other Ambulatory Visit: Payer: Medicaid Other

## 2018-05-20 ENCOUNTER — Other Ambulatory Visit (INDEPENDENT_AMBULATORY_CARE_PROVIDER_SITE_OTHER): Payer: Medicaid Other

## 2018-05-20 DIAGNOSIS — E1165 Type 2 diabetes mellitus with hyperglycemia: Secondary | ICD-10-CM

## 2018-05-20 DIAGNOSIS — Z794 Long term (current) use of insulin: Secondary | ICD-10-CM | POA: Diagnosis not present

## 2018-05-20 LAB — COMPREHENSIVE METABOLIC PANEL
ALT: 54 U/L — ABNORMAL HIGH (ref 0–35)
AST: 44 U/L — ABNORMAL HIGH (ref 0–37)
Albumin: 3.8 g/dL (ref 3.5–5.2)
Alkaline Phosphatase: 105 U/L (ref 39–117)
BUN: 15 mg/dL (ref 6–23)
CO2: 23 mEq/L (ref 19–32)
Calcium: 9 mg/dL (ref 8.4–10.5)
Chloride: 105 mEq/L (ref 96–112)
Creatinine, Ser: 0.93 mg/dL (ref 0.40–1.20)
GFR: 68.53 mL/min (ref >60.00)
Glucose, Bld: 256 mg/dL — ABNORMAL HIGH (ref 70–99)
Potassium: 3.7 mEq/L (ref 3.5–5.1)
Sodium: 137 mEq/L (ref 135–145)
Total Bilirubin: 0.5 mg/dL (ref 0.2–1.2)
Total Protein: 6.5 g/dL (ref 6.0–8.3)

## 2018-05-20 LAB — LIPID PANEL
Cholesterol: 168 mg/dL (ref 0–200)
HDL: 29.3 mg/dL — ABNORMAL LOW (ref >39.00)
NonHDL: 139.12
Total CHOL/HDL Ratio: 6
Triglycerides: 308 mg/dL — ABNORMAL HIGH (ref 0.0–149.0)
VLDL: 61.6 mg/dL — ABNORMAL HIGH (ref 0.0–40.0)

## 2018-05-20 LAB — HEMOGLOBIN A1C: Hgb A1c MFr Bld: 9.8 % — ABNORMAL HIGH (ref 4.6–6.5)

## 2018-05-20 LAB — LDL CHOLESTEROL, DIRECT: Direct LDL: 105 mg/dL

## 2018-05-21 ENCOUNTER — Ambulatory Visit (INDEPENDENT_AMBULATORY_CARE_PROVIDER_SITE_OTHER): Payer: Medicaid Other | Admitting: Endocrinology

## 2018-05-21 ENCOUNTER — Encounter: Payer: Self-pay | Admitting: Endocrinology

## 2018-05-21 VITALS — BP 118/78 | HR 88 | Ht 61.0 in | Wt 273.0 lb

## 2018-05-21 DIAGNOSIS — E782 Mixed hyperlipidemia: Secondary | ICD-10-CM

## 2018-05-21 DIAGNOSIS — E1165 Type 2 diabetes mellitus with hyperglycemia: Secondary | ICD-10-CM

## 2018-05-21 DIAGNOSIS — Z794 Long term (current) use of insulin: Secondary | ICD-10-CM | POA: Diagnosis not present

## 2018-05-21 MED ORDER — FLUCONAZOLE 150 MG PO TABS: 150.0000 mg | ORAL_TABLET | Freq: Once | ORAL | 1 refills | Status: AC

## 2018-05-21 MED ORDER — ROSUVASTATIN CALCIUM 10 MG PO TABS: 10.0000 mg | ORAL_TABLET | Freq: Every day | ORAL | 3 refills | Status: DC

## 2018-05-21 NOTE — Progress Notes (Signed)
Patient ID: Kimberly [name], female   DOB: 09-27-1970, 47 y.o.   MRN: [id number]          Reason for Appointment: Follow-up for Type 2 Diabetes  Referring physician: Lynnea Ferrier   History of Present Illness:          Date of diagnosis of type 2 diabetes mellitus: 12/2014   Background history:   She was initially tried on metformin but because of side effects as was stopped and she was tried on glipizide and subsequently [place].  She apparently took Jardiance for at least a couple of months but because of recurrent yeast infection she stopped it and she did not think it was helping her glucose also He was started on Lantus insulin in 12/2015 Subsequently Bydureon was added in 03/2016  Recent history:   INSULIN regimen is:  V-go pump 20 with U-500 insulin, 20 units basal  Boluses 6 units with meals  Non-insulin hypoglycemic drugs the patient is taking are: Bydureon 2 mg weekly, metformin ER 500 mg twice daily  Most recent A1c is 9.8 and higher than before  Current management, blood sugar patterns and problems identified:  Although her blood sugars were improving significantly after starting the V-go pump earlier this month they are significantly higher now and consistently over 200 and as high as 401  She has not been motivated to watch her diet at all and she thinks she is eating high carbohydrate foods throughout the day  Also sometimes eating more fruits; although she usually drinks water at work she is sometimes drinking sweet tea grape juice in the evenings or during the night  High sugars on waking up may be related to her drinking sweet tea or juice during the night  She does not know to increase her bolus amounts when she is eating larger meals or when the blood sugars are high  She thinks she is changing her V-go pump every day consistently when she gets to work  However cannot explain why even at noon her blood sugar was 400        Side effects from  medications have been: Nausea and diarrhea from high dose metformin, recurrent yeast infections from Jardiance  Glucose monitoring:  About 1 times a day         Glucometer:  Accu-Chek .      Blood Glucose readings by time of day from review of monitor as above   PRE-MEAL Fasting Lunch Dinner Bedtime Overall  Glucose range:  76-395  195-401  158-400  191-347   Mean/median:  205    275 254   POST-MEAL PC Breakfast PC Lunch PC Dinner  Glucose range:   234-352   Mean/median:      Self-care:  Typical meal intake: Breakfast is   bagel, scrambled egg/oatmeal, juice    Lunches sandwich or leftovers Dinner is variable, sometimes only cereal Snacks will be yogurt or chips            Dietician visit, most recent: 08/2017               Exercise:  some walking at times  Weight history:  Wt Readings from Last 3 Encounters:  05/21/18 273 lb (123.8 kg)  05/01/18 273 lb (123.8 kg)  03/12/18 270 lb (122.5 kg)    `Glycemic control:   Lab Results  Component Value Date   HGBA1C 9.8 (H) 05/20/2018   HGBA1C 8.9 (H) 01/09/2018   HGBA1C 9.2 08/02/2017   Lab Results  Component Value Date   MICROALBUR 0.3 03/23/2016   LDLCALC 250 (H) 10/28/2015   CREATININE 0.93 05/20/2018   No results found for: [facility]  Lab Results  Component Value Date   FRUCTOSAMINE 312 (H) 03/06/2018   FRUCTOSAMINE 266 02/12/2018   FRUCTOSAMINE 266 09/02/2017      Allergies as of 05/21/2018   No Known Allergies     Medication List        Accurate as of 05/21/18  2:01 PM. Always use your most recent med list.          ACCU-CHEK FASTCLIX LANCET Kit 1 each by Does not apply route daily. Use to check blood sugar 5 times daily.   ACCU-CHEK GUIDE ME w/Device Kit 1 each by Does not apply route daily.   acetaZOLAMIDE 250 MG tablet Commonly known as:  DIAMOX TAKE 1 TABLET (250 MG TOTAL) BY MOUTH 2 (TWO) TIMES DAILY.   ALPRAZolam 1 MG tablet Commonly known as:  XANAX TAKE 1 TABLET BY MOUTH THREE  TIMES A DAY AS NEEDED   atenolol 100 MG tablet Commonly known as:  TENORMIN TAKE ONE TABLET BY MOUTH ONCE DAILY   buPROPion 150 MG 12 hr tablet Commonly known as:  WELLBUTRIN SR TAKE 1 TABLET BY MOUTH EVERY DAY   cetirizine 10 MG tablet Commonly known as:  ZYRTEC Take 1 tablet (10 mg total) by mouth daily.   Exenatide ER 2 MG Pen INJECT 2 MG INTO THE SKIN ONCE A WEEK.   fluticasone 50 MCG/ACT nasal spray Commonly known as:  FLONASE SPRAY 2 SPRAYS INTO EACH NOSTRIL EVERY DAY   glucose blood test strip Use as instructed to test blood sugar 5 times daily.   Insulin Pen Needle 31G X 8 MM Misc Use with pen QD   insulin regular human CONCENTRATED 500 UNIT/ML injection Commonly known as:  HUMULIN R INJECT 15 UNITS B4 BREAKFAST+DINNER+10 UNITS BEFORE LUNCH ADJUST AS DIRECTED. DISCARD AFTER 40 DAYS   Insulin Syringe/Needle U-500 31G X 0.5 ML Misc 1 each by Does not apply route daily. Use to inject insulin 3 times daily.   metFORMIN 500 MG 24 hr tablet Commonly known as:  GLUCOPHAGE-XR Take 4 tablets (2,000 mg total) by mouth daily with supper.   rosuvastatin 5 MG tablet Commonly known as:  CRESTOR TAKE 1 TABLET BY MOUTH EVERY DAY   V-GO 20 Kit Inject 1 kit into the skin daily.   vortioxetine HBr 10 MG Tabs tablet Commonly known as:  TRINTELLIX Take 1 tablet (10 mg total) by mouth daily.       Allergies: No Known Allergies  Past Medical History:  Diagnosis Date  . Abnormal vaginal Pap smear    tx with cryotherapy  . Anxiety   . Depression   . Diabetes mellitus without complication (HCC)   . History of kidney stones    passed stone - no surgery  . Hypertension   . Migraine    otc med prn  . Obesity   . Pseudotumor cerebri   . SVD (spontaneous vaginal delivery)    x 3  . Tension headache     Past Surgical History:  Procedure Laterality Date  . ABDOMINAL HYSTERECTOMY    . ANTERIOR CERVICAL DECOMP/DISCECTOMY FUSION N/A 01/03/2016   Procedure: ANTERIOR  CERVICAL DECOMPRESSION FUSION CERVICAL FIVE-SIX,CERVICAL SIX-SEVEN.;  Surgeon: Lisbeth Renshaw, MD;  Location: [facility];  Service: Neurosurgery;  Laterality: N/A;  right side approach  . CRYOTHERAPY    . LAPAROSCOPIC ASSISTED VAGINAL HYSTERECTOMY  Bilateral 04/21/2014   Procedure: LAPAROSCOPIC ASSISTED VAGINAL HYSTERECTOMY, BILATERAL SALPINGECTOMY;  Surgeon: Lavina Hamman, MD;  Location: [facility];  Service: Gynecology;  Laterality: Bilateral;  . MANDIBLE FRACTURE SURGERY  2002   x 1  . TUBAL LIGATION  11/2003  . WISDOM TOOTH EXTRACTION      Family History  Problem Relation Age of Onset  . Hypertension Mother   . Diabetes Mother   . Diabetes Sister   . Hypertension Sister     Social History:  reports that she has never smoked. She has never used smokeless tobacco. She reports that she drinks about 1.0 standard drinks of alcohol per week. She reports that she does not use drugs.   Review of Systems   Lipid history: Taking Crestor 5 mg daily since 9/19  Triglycerides have been consistently high, LDL 105 and only slightly better She is not really watching her diet lately   Lab Results  Component Value Date   CHOL 168 05/20/2018   CHOL 188 01/09/2018   CHOL 340 (H) 10/28/2015   Lab Results  Component Value Date   HDL 29.30 (L) 05/20/2018   HDL 29.90 (L) 01/09/2018   HDL 28 (L) 10/28/2015   Lab Results  Component Value Date   LDLCALC 250 (H) 10/28/2015   LDLCALC 110 (H) 12/31/2012   Lab Results  Component Value Date   TRIG 308.0 (H) 05/20/2018   TRIG (H) 01/09/2018    454.0 Triglyceride is over 400; calculations on Lipids are invalid.   TRIG 310 (H) 10/28/2015   Lab Results  Component Value Date   CHOLHDL 6 05/20/2018   CHOLHDL 6 01/09/2018   CHOLHDL 12.1 (H) 10/28/2015   Lab Results  Component Value Date   LDLDIRECT 105.0 05/20/2018   LDLDIRECT 107.0 01/09/2018   LDLDIRECT 122 07/27/2016        Has mild increase in ALT, chronic stable     Lab Results    Component Value Date   ALT 54 (H) 05/20/2018   Hypertension: This is being treated with atenolol 100 mg by her PCP  BP Readings from Last 3 Encounters:  05/21/18 118/78  05/08/18 118/79  05/01/18 122/76      Most recent foot exam:2/19     LABS:  Lab on 05/20/2018  Component Date Value Ref Range Status  . Cholesterol 05/20/2018 168  0 - 200 mg/dL Final   ATP III Classification       Desirable:  < 200 mg/dL               Borderline High:  200 - 239 mg/dL          High:  > = 161 mg/dL  . Triglycerides 05/20/2018 308.0* 0.0 - 149.0 mg/dL Final   Normal:  <096 mg/dLBorderline High:  150 - 199 mg/dL  . HDL 05/20/2018 29.30* >39.00 mg/dL Final  . VLDL 04/54/0981 61.6* 0.0 - 40.0 mg/dL Final  . Total CHOL/HDL Ratio 05/20/2018 6   Final                  Men          Women1/2 Average Risk     3.4          3.3Average Risk          5.0          4.42X Average Risk          9.6          7.13X Average  Risk          15.0          11.0                      . NonHDL 05/20/2018 139.12   Final   NOTE:  Non-HDL goal should be 30 mg/dL higher than patient's LDL goal (i.e. LDL goal of < 70 mg/dL, would have non-HDL goal of < 100 mg/dL)  . Sodium 05/20/2018 137  135 - 145 mEq/L Final  . Potassium 05/20/2018 3.7  3.5 - 5.1 mEq/L Final  . Chloride 05/20/2018 105  96 - 112 mEq/L Final  . CO2 05/20/2018 23  19 - 32 mEq/L Final  . Glucose, Bld 05/20/2018 256* 70 - 99 mg/dL Final  . BUN 45/40/9811 15  6 - 23 mg/dL Final  . Creatinine, Ser 05/20/2018 0.93  0.40 - 1.20 mg/dL Final  . Total Bilirubin 05/20/2018 0.5  0.2 - 1.2 mg/dL Final  . Alkaline Phosphatase 05/20/2018 105  39 - 117 U/L Final  . AST 05/20/2018 44* 0 - 37 U/L Final  . ALT 05/20/2018 54* 0 - 35 U/L Final  . Total Protein 05/20/2018 6.5  6.0 - 8.3 g/dL Final  . Albumin 91/47/8295 3.8  3.5 - 5.2 g/dL Final  . Calcium 62/13/0865 9.0  8.4 - 10.5 mg/dL Final  . GFR 78/46/9629 68.53  >60.00 mL/min Final  . Hgb A1c MFr Bld 05/20/2018 9.8*  4.6 - 6.5 % Final   Glycemic Control Guidelines for People with Diabetes:Non Diabetic:  <6%Goal of Therapy: <7%Additional Action Suggested:  >8%   . Direct LDL 05/20/2018 105.0  mg/dL Final   Optimal:  <528 mg/dLNear or Above Optimal:  100-129 mg/dLBorderline High:  130-159 mg/dLHigh:  160-189 mg/dLVery High:  >190 mg/dL    Physical Examination:  BP 118/78 (BP Location: Left Arm, Patient Position: Sitting, Cuff Size: Normal)   Pulse 88   Ht 5\' 1"  (1.549 m)   Wt 273 lb (123.8 kg)   LMP 09/23/2013   SpO2 98%   BMI 51.58 kg/m        ASSESSMENT:  Diabetes type 2, uncontrolled with morbid obesity     She has had persistently high A1c since her diagnosis in 2016, last 8.9  Currently on a regimen of basal bolus insulin with the V-go pump using U-500 insulin, Bydureon and metformin low-dose  Although her blood sugars are better with starting the pump they are poorly controlled again Sugars are the same or higher than when she was put on the pump She think this is mostly from her poor diet with eating a lot of carbohydrates and not sweets and sweet drinks She also thinks she is stressed and also not exercising at all  LIPIDS: Although she thinks she is taking her Crestor her LDL is almost the same, triglycerides only slightly better  Abnormal liver function: Likely to be from fatty liver  PLAN:    She will be given a trial of the 30 unit pump for the next 3 days and she will call back on Monday if this is working better  Stop drinking sweet drinks completely, discussed that she can use artificial sweeteners like Stevia  Cut back on carbohydrates and high-fat foods  Most likely she needs to bolus at least 2 to 4 units more for her meals  She will check blood sugars been consistently and at least 2-3 times a day including after meals  Encouraged her to  start walking program  Consider follow-up with dietitian also, last seen in 3/19  To take extra 2 to 4 units for high blood  sugars with a correction factor of 2: 100  Continue Bydureon and Metformin  She agrees to a trial of Jardiance again which she has at home, prescription for Diflucan has been sent  Given her information on SGLT2 drugs and how to potentially avoid yeast infections and increased fluid intake  Increase Crestor to 10 mg  Patient Instructions  Click 1 extra for each 100 mg hi sugar over 100  Counseling time on subjects discussed in assessment and plan sections is over 50% of today's 25 minute visit     Reather Littler 05/21/2018, 2:01 PM   Note: This office note was prepared with Dragon voice recognition system technology. Any transcriptional errors that result from this process are unintentional.

## 2018-05-21 NOTE — Patient Instructions (Addendum)
Click 1 extra for each 100 mg hi sugar over 100  Jardiance in am  Check blood sugars on waking up  7 days a week  Also check blood sugars about 2 hours after meals and do this after different meals by rotation  Recommended blood sugar levels on waking up are 90-130 and about 2 hours after meal is 130-160  Please bring your blood sugar monitor to each visit, thank you  Crestor 10mg    Jardiance 10mg 

## 2018-05-24 ENCOUNTER — Other Ambulatory Visit: Payer: Self-pay | Admitting: Family Medicine

## 2018-05-24 ENCOUNTER — Other Ambulatory Visit: Payer: Self-pay | Admitting: Endocrinology

## 2018-05-28 ENCOUNTER — Other Ambulatory Visit: Payer: Self-pay

## 2018-05-28 ENCOUNTER — Telehealth: Payer: Self-pay | Admitting: Endocrinology

## 2018-05-28 MED ORDER — FREESTYLE LIBRE 14 DAY READER DEVI: 1.0000 | Freq: Every day | 0 refills | Status: DC

## 2018-05-28 MED ORDER — FREESTYLE LIBRE 14 DAY SENSOR MISC: 1.0000 | Freq: Every day | 3 refills | Status: DC

## 2018-05-28 MED ORDER — OMNIPOD DASH PDM (GEN 4) KIT: 1.0000 | PACK | Freq: Every day | 0 refills | Status: DC

## 2018-05-28 NOTE — Telephone Encounter (Signed)
Rx sent 

## 2018-05-28 NOTE — Telephone Encounter (Signed)
Okay, she needs to be set up with Bonita QuinLinda and myself for training as a new pump start for the OmniPod, currently she is on the V-go

## 2018-05-28 NOTE — Telephone Encounter (Signed)
Spoke with the patient and let her know that we will get her scheduled for this asap- patient has started a new job so this may be difficult

## 2018-05-28 NOTE — Telephone Encounter (Signed)
Pharmacy has requested an electronic fill for patients Omni Pod Dash due to insurance.  [facility] - [phone] [place] Ph # [phone] Fax # [phone]

## 2018-05-28 NOTE — Telephone Encounter (Signed)
Is this okay with you?   

## 2018-05-28 NOTE — Telephone Encounter (Signed)
LMTCB

## 2018-05-29 ENCOUNTER — Other Ambulatory Visit: Payer: Self-pay

## 2018-05-29 MED ORDER — OMNIPOD DASH PODS (GEN 4) MISC: 1.0000 | 11 refills | Status: DC

## 2018-05-30 DIAGNOSIS — E1165 Type 2 diabetes mellitus with hyperglycemia: Secondary | ICD-10-CM | POA: Diagnosis not present

## 2018-06-01 ENCOUNTER — Other Ambulatory Visit: Payer: Self-pay | Admitting: Family Medicine

## 2018-06-06 NOTE — Telephone Encounter (Signed)
Pt is scheduled °

## 2018-06-11 ENCOUNTER — Other Ambulatory Visit: Payer: Self-pay | Admitting: Family Medicine

## 2018-06-12 NOTE — Telephone Encounter (Signed)
Ok to refill??  Last office visit 01/14/2018.  Last refill 03/20/2018, #2 refills

## 2018-06-27 ENCOUNTER — Other Ambulatory Visit: Payer: Medicaid Other

## 2018-06-30 ENCOUNTER — Encounter: Payer: Medicaid Other | Attending: Endocrinology | Admitting: Nutrition

## 2018-06-30 ENCOUNTER — Other Ambulatory Visit: Payer: Self-pay

## 2018-06-30 DIAGNOSIS — E1165 Type 2 diabetes mellitus with hyperglycemia: Secondary | ICD-10-CM | POA: Insufficient documentation

## 2018-06-30 DIAGNOSIS — Z794 Long term (current) use of insulin: Secondary | ICD-10-CM | POA: Insufficient documentation

## 2018-06-30 DIAGNOSIS — E119 Type 2 diabetes mellitus without complications: Secondary | ICD-10-CM

## 2018-06-30 DIAGNOSIS — Z713 Dietary counseling and surveillance: Secondary | ICD-10-CM | POA: Insufficient documentation

## 2018-06-30 MED ORDER — GLUCOSE BLOOD VI STRP: 1.0000 | ORAL_STRIP | Freq: Every day | 3 refills | Status: DC

## 2018-06-30 NOTE — Progress Notes (Signed)
Patient forgot to bring her U-500R insulin with her.  She also did not charge the PDM or read over the manual.   We discussed how this pump is different from her V-go and she put in the insulin settings per Dr. Remus Blake orders.  Basal rate:  MN: 1.0, 5AM: 1.2, 5PM: 1.1 I/C ratio 10, target 140 with corrections over 140, ISF: 40, and timing: 6 hours. She was shown how to fill a Pod using saline, and she re deomonstrate how to do this correctly She promised to fill a pod and attach it when she gets to work, where she has some insulin.  We discussed how to give a correction dose and she agreed to do this immediately, when she puts the pod on today. We reviewed how to give a bolus and she re demonstrated this correctly.  We linked her meter to her pump and she was shown how to use the meter.   She had no final questions.

## 2018-07-01 ENCOUNTER — Telehealth: Payer: Self-pay | Admitting: Nutrition

## 2018-07-01 ENCOUNTER — Encounter: Payer: Medicaid Other | Admitting: Nutrition

## 2018-07-01 ENCOUNTER — Encounter: Payer: Self-pay | Admitting: Endocrinology

## 2018-07-01 ENCOUNTER — Other Ambulatory Visit: Payer: Self-pay

## 2018-07-01 ENCOUNTER — Ambulatory Visit (INDEPENDENT_AMBULATORY_CARE_PROVIDER_SITE_OTHER): Payer: Medicaid Other | Admitting: Endocrinology

## 2018-07-01 DIAGNOSIS — E1165 Type 2 diabetes mellitus with hyperglycemia: Secondary | ICD-10-CM | POA: Diagnosis not present

## 2018-07-01 DIAGNOSIS — E119 Type 2 diabetes mellitus without complications: Secondary | ICD-10-CM

## 2018-07-01 DIAGNOSIS — Z794 Long term (current) use of insulin: Secondary | ICD-10-CM | POA: Diagnosis not present

## 2018-07-01 LAB — GLUCOSE, POCT (MANUAL RESULT ENTRY): POC Glucose: 178 mg/dl — AB (ref 70–99)

## 2018-07-01 MED ORDER — GLUCOSE BLOOD VI STRP: 1.0000 | ORAL_STRIP | 11 refills | Status: DC | PRN

## 2018-07-01 NOTE — Telephone Encounter (Signed)
No answer X 2 message left on machine to call me with blood sugar readings, until 9PM, and to call office if blood sugars are still high.

## 2018-07-01 NOTE — Progress Notes (Signed)
Patient ID: Kimberly [name], female   DOB: 07-23-1970, 48 y.o.   MRN: [id number]          Reason for Appointment: Follow-up for Type 2 Diabetes    History of Present Illness:          Date of diagnosis of type 2 diabetes mellitus: 12/2014   Background history:   She was initially tried on metformin but because of side effects as was stopped and she was tried on glipizide and subsequently [place].  She apparently took Jardiance for at least a couple of months but because of recurrent yeast infection she stopped it and she did not think it was helping her glucose also He was started on Lantus insulin in 12/2015 Subsequently Bydureon was added in 03/2016  Recent history:   INSULIN regimen is:  Basal rate:  MN: 1.0, 5 AM = 1.2 and 5 PM = 1.1.  Total basal insulin 27 units I/C ratio 10, target 140 with corrections over 140, ISF: 40, and timing: 6 hours. Boluses 12 units with meals  Previously using V-go pump 20 with U-500 insulin, 20 units basal     Non-insulin hypoglycemic drugs the patient is taking are: Bydureon 2 mg weekly, metformin ER 500 mg twice daily  Most recent A1c is 9.8 and higher than before  Current management, blood sugar patterns and problems identified:  She is just starting the OmniPod insulin pump on 06/30/2017 around midday  Because of her respiratory infection her basal rates were increased 20% until this morning  Apparently her blood sugars have been over 200 and higher for about 2 weeks possibly from a respiratory infection  Also yesterday her blood sugar had gone up to 462 while starting the pump even though she was using her V-go pump consistently as before  Also her boluses have been increased 100%, previously bolusing 6 units for meals  Blood sugars yesterday came down gradually around 5 PM was 237  However blood sugar was back up to 300 late evening around 9-pM  She did not do a correction for this but took 12 units bolus for her bedtime snack of  her cereal bowl even with her blood sugar being 140  She woke up around 4 this morning feeling sweaty and blood sugar was 53  Blood sugar at breakfast time was 93 and in the office about 2 hours later is 178        Side effects from medications have been: Nausea and diarrhea from high dose metformin, recurrent yeast infections from Jardiance  Glucose monitoring:  About 4 times a day         Glucometer:  Accu-Chek and contour .      Blood Glucose readings as above  Previous readings  PRE-MEAL Fasting Lunch Dinner Bedtime Overall  Glucose range:  76-395  195-401  158-400  191-347   Mean/median:  205    275 254   POST-MEAL PC Breakfast PC Lunch PC Dinner  Glucose range:   234-352   Mean/median:      Self-care:  Typical meal intake: Breakfast is   bagel, scrambled egg/oatmeal, juice    Lunches sandwich or leftovers Dinner is variable, sometimes only cereal Snacks will be yogurt or chips            Dietician visit, most recent: 08/2017               Exercise:  some walking at times  Weight history:  Wt Readings from Last 3  Encounters:  05/21/18 273 lb (123.8 kg)  05/01/18 273 lb (123.8 kg)  03/12/18 270 lb (122.5 kg)    `Glycemic control:   Lab Results  Component Value Date   HGBA1C 9.8 (H) 05/20/2018   HGBA1C 8.9 (H) 01/09/2018   HGBA1C 9.2 08/02/2017   Lab Results  Component Value Date   MICROALBUR 0.3 03/23/2016   LDLCALC 250 (H) 10/28/2015   CREATININE 0.93 05/20/2018   No results found for: MICRALBCREAT  Lab Results  Component Value Date   FRUCTOSAMINE 312 (H) 03/06/2018   FRUCTOSAMINE 266 02/12/2018   FRUCTOSAMINE 266 09/02/2017      Allergies as of 07/01/2018   No Known Allergies     Medication List       Accurate as of July 01, 2018 12:36 PM. Always use your most recent med list.        ACCU-CHEK FASTCLIX LANCET Kit 1 each by Does not apply route daily. Use to check blood sugar 5 times daily.   ACCU-CHEK GUIDE ME w/Device Kit 1 each  by Does not apply route daily.   acetaZOLAMIDE 250 MG tablet Commonly known as:  DIAMOX TAKE 1 TABLET (250 MG TOTAL) BY MOUTH 2 (TWO) TIMES DAILY.   ALPRAZolam 1 MG tablet Commonly known as:  XANAX TAKE 1 TABLET BY MOUTH THREE TIMES A DAY AS NEEDED   atenolol 100 MG tablet Commonly known as:  TENORMIN TAKE ONE TABLET BY MOUTH ONCE DAILY   buPROPion 150 MG 12 hr tablet Commonly known as:  WELLBUTRIN SR TAKE 1 TABLET BY MOUTH EVERY DAY   cetirizine 10 MG tablet Commonly known as:  ZYRTEC Take 1 tablet (10 mg total) by mouth daily.   Exenatide ER 2 MG Pen Commonly known as:  BYDUREON INJECT 2 MG INTO THE SKIN ONCE A WEEK.   fluticasone 50 MCG/ACT nasal spray Commonly known as:  FLONASE SPRAY 2 SPRAYS INTO EACH NOSTRIL EVERY DAY   glucose blood test strip 1 each by Other route daily. Use as instructed to check blood sugar before meals, 2 hours after meals, and before bed. DX:E11.65   glucose blood test strip 1 each by Other route as needed for other. Use as instructed to check blood sugar 4 times daily. DX:E11.65   Insulin Pen Needle 31G X 8 MM Misc Use with pen QD   insulin regular human CONCENTRATED 500 UNIT/ML injection Commonly known as:  HUMULIN R INJECT 15 UNITS SUBCUTANEOUSLY BEFORE BREAKFAST+DINNER+10 UNITS BEFORE LUNCH ADJUST AS DIRECTED.   Insulin Syringe/Needle U-500 31G X 0.5 ML Misc 1 each by Does not apply route daily. Use to inject insulin 3 times daily.   OMNIPOD DASH 5 PACK Misc 1 each by Does not apply route every 3 (three) days. APPLY ONE POD TO BODY EVERY 3 DAYS FOR INSULIN DELIVERY.   rosuvastatin 10 MG tablet Commonly known as:  CRESTOR Take 1 tablet (10 mg total) by mouth daily.   vortioxetine HBr 10 MG Tabs tablet Commonly known as:  TRINTELLIX Take 1 tablet (10 mg total) by mouth daily.       Allergies: No Known Allergies  Past Medical History:  Diagnosis Date  . Abnormal vaginal Pap smear    tx with cryotherapy  . Anxiety     . Depression   . Diabetes mellitus without complication (HCC)   . History of kidney stones    passed stone - no surgery  . Hypertension   . Migraine    otc med prn  .  Obesity   . Pseudotumor cerebri   . SVD (spontaneous vaginal delivery)    x 3  . Tension headache     Past Surgical History:  Procedure Laterality Date  . ABDOMINAL HYSTERECTOMY    . ANTERIOR CERVICAL DECOMP/DISCECTOMY FUSION N/A 01/03/2016   Procedure: ANTERIOR CERVICAL DECOMPRESSION FUSION CERVICAL FIVE-SIX,CERVICAL SIX-SEVEN.;  Surgeon: Lisbeth Renshaw, MD;  Location: [facility];  Service: Neurosurgery;  Laterality: N/A;  right side approach  . CRYOTHERAPY    . LAPAROSCOPIC ASSISTED VAGINAL HYSTERECTOMY Bilateral 04/21/2014   Procedure: LAPAROSCOPIC ASSISTED VAGINAL HYSTERECTOMY, BILATERAL SALPINGECTOMY;  Surgeon: Lavina Hamman, MD;  Location: [facility];  Service: Gynecology;  Laterality: Bilateral;  . MANDIBLE FRACTURE SURGERY  2002   x 1  . TUBAL LIGATION  11/2003  . WISDOM TOOTH EXTRACTION      Family History  Problem Relation Age of Onset  . Hypertension Mother   . Diabetes Mother   . Diabetes Sister   . Hypertension Sister     Social History:  reports that she has never smoked. She has never used smokeless tobacco. She reports current alcohol use of about 1.0 standard drinks of alcohol per week. She reports that she does not use drugs.   Review of Systems   Lipid history: Taking Crestor for treatment and this was increased in 11/19 up to 10 mg  Triglycerides have been consistently high, LDL 105, needs follow-up   Lab Results  Component Value Date   CHOL 168 05/20/2018   CHOL 188 01/09/2018   CHOL 340 (H) 10/28/2015   Lab Results  Component Value Date   HDL 29.30 (L) 05/20/2018   HDL 29.90 (L) 01/09/2018   HDL 28 (L) 10/28/2015   Lab Results  Component Value Date   LDLCALC 250 (H) 10/28/2015   LDLCALC 110 (H) 12/31/2012   Lab Results  Component Value Date   TRIG 308.0 (H)  05/20/2018   TRIG (H) 01/09/2018    454.0 Triglyceride is over 400; calculations on Lipids are invalid.   TRIG 310 (H) 10/28/2015   Lab Results  Component Value Date   CHOLHDL 6 05/20/2018   CHOLHDL 6 01/09/2018   CHOLHDL 12.1 (H) 10/28/2015   Lab Results  Component Value Date   LDLDIRECT 105.0 05/20/2018   LDLDIRECT 107.0 01/09/2018   LDLDIRECT 122 07/27/2016        Has mild increase in ALT, chronic stable     Lab Results  Component Value Date   ALT 54 (H) 05/20/2018   Hypertension: This is being treated with atenolol 100 mg by her PCP  BP Readings from Last 3 Encounters:  05/21/18 118/78  05/08/18 118/79  05/01/18 122/76      Most recent foot exam:2/19     LABS:  Office Visit on 07/01/2018  Component Date Value Ref Range Status  . POC Glucose 07/01/2018 178* 70 - 99 mg/dl Final    Physical Examination:  LMP 09/23/2013        ASSESSMENT:  Diabetes type 2, uncontrolled with morbid obesity     She has had persistently high A1c since her diagnosis in 2016, last over 9%  Currently on a regimen of insulin pump using U-500 insulin, Bydureon and metformin low-dose  She is just starting the OmniPod insulin pump instead of the V-go to allow better stability and hopefully better control  Recently blood sugars have been higher for unknown reasons but she thinks it was from sinusitis However with starting the pump yesterday when her blood sugars  were over 400 the blood sugars have come down significantly overnight She did get a low sugar during the night partly because of over bolusing for taking 12 units for cereal bowl without any protein Also was getting a temporarily higher basal rate yesterday for acute illness  Although it is unclear what her true basal requirement is she probably needs a little less during the night and a little more during the day with sugar rising now after breakfast up to 178 Her insulin requirement is still relatively large and  currently not on Jardiance   PLAN:    She will continue to work with the diabetes educator to learn how to use the new pump  She will continue to use the U-500 insulin  Increase basal rate by 0.1 at 5 AM and reduce five 0.1 at midnight  She will use only 6 units to bolus for snacks and 12 units for meals  She will avoid high carbohydrate foods like cereal  To call if she is having unusually high or low readings today and follow-up again tomorrow  Reminded her to time her boluses 30 minutes before eating  Continue Bydureon and discuss use of metformin and Jardiance next visit  We will also need follow-up of lipids with her next lab work  There are no Patient Instructions on file for this visit.  Counseling time on subjects discussed in assessment and plan sections is over 50% of today's 25 minute visit      Reather Littler 07/01/2018, 12:36 PM   Note: This office note was prepared with Dragon voice recognition system technology. Any transcriptional errors that result from this process are unintentional.

## 2018-07-01 NOTE — Telephone Encounter (Signed)
Spoke with patient.  She reports that she filled and started her OmniPod pump without any difficulty.  She reports giving a correction bolus at that time, because blood sugar was 450.  Denies nausea.  Says ate lunch 1 hour ago and bolused 12u plus correction and blood sugar now is 421.   She was talked through doing a temp basal rate incrase of 20% for 12 hours (max.), per Dr. Remus Blake order.   She was told to test blood sugar at 4PM and call office if blood sugar is still high.  She agreed to do this.

## 2018-07-02 ENCOUNTER — Encounter: Payer: Medicaid Other | Admitting: Nutrition

## 2018-07-02 ENCOUNTER — Ambulatory Visit (INDEPENDENT_AMBULATORY_CARE_PROVIDER_SITE_OTHER): Payer: Medicaid Other | Admitting: Endocrinology

## 2018-07-02 ENCOUNTER — Encounter: Payer: Self-pay | Admitting: Endocrinology

## 2018-07-02 VITALS — Ht 61.0 in

## 2018-07-02 DIAGNOSIS — E1165 Type 2 diabetes mellitus with hyperglycemia: Secondary | ICD-10-CM | POA: Diagnosis not present

## 2018-07-02 DIAGNOSIS — Z794 Long term (current) use of insulin: Secondary | ICD-10-CM | POA: Diagnosis not present

## 2018-07-02 DIAGNOSIS — E119 Type 2 diabetes mellitus without complications: Secondary | ICD-10-CM

## 2018-07-02 NOTE — Progress Notes (Signed)
Patient reported no difficulty giving boluses of 12u yesterday or this morning.  We reviewed temp basal rates, and alerts and alarms.  She changed her basal rates per Dr. Remus Blake order: MN: 0.9, 5AM: 1.3, and 5PM: 1.1 .  She agreed to read over the Resource guide and manual tonight.

## 2018-07-02 NOTE — Patient Instructions (Signed)
REad over resource guide and manual. Give 12u for meals and 6u if eating a snack

## 2018-07-02 NOTE — Patient Instructions (Signed)
Test blood sugars before meals and at bedtime Fill and attach the pod as soon as you get to work today. Test blood sugar and do a correction dose when you attach the pod Read over the resource booklet and the manual

## 2018-07-02 NOTE — Progress Notes (Signed)
Patient ID: Kimberly [name], female   DOB: Aug 25, 1970, 48 y.o.   MRN: [id number]          Reason for Appointment: Follow-up for Type 2 Diabetes    History of Present Illness:          Date of diagnosis of type 2 diabetes mellitus: 12/2014   Background history:   She was initially tried on metformin but because of side effects as was stopped and she was tried on glipizide and subsequently [place].  She apparently took Jardiance for at least a couple of months but because of recurrent yeast infection she stopped it and she did not think it was helping her glucose also He was started on Lantus insulin in 12/2015 Subsequently Bydureon was added in 03/2016  Recent history:   INSULIN regimen is:  Basal rate:  MN: 0.9, 5 AM = 1.3 and 5 PM = 1.1.  Total basal insulin 27 units I/C ratio 10, target 140 with corrections over 140, ISF: 40, and timing: 6 hours. Boluses 12 units with meals   Non-insulin hypoglycemic drugs the patient is taking are: Bydureon 2 mg weekly, metformin ER 500 mg twice daily  Most recent A1c is 9.8 and higher than before  Current management, blood sugar patterns and problems identified:  She is starting the OmniPod insulin pump on 06/30/2017 around midday  Because of sugar being low during the night her basal rate was reduced down to 0.9 at midnight  Also 5 AM basal rate was increased  She is checking her blood sugars mostly with her Accu-Chek meter and not entering all of them in the pump  Last night she had relatively low-fat chicken tacos at home and blood sugar was 135 after eating along with a reading of 138 before eating  However around 7 PM before dinner her blood sugar was down to 69  She did not check her sugar after lunch yesterday when it was 178  Today she felt shaky and blood sugar was 72 around 7 AM before getting up.  She did not have any low sugar during the night otherwise  However with drinking 8 ounces of juice her blood sugar has been rising  and up to 214 in the office without any morning bolus and was 179 when she woke up  She has no difficulty with using the pump and doing the boluses        Side effects from medications have been: Nausea and diarrhea from high dose metformin, recurrent yeast infections from Jardiance  Glucose monitoring:  About 4 times a day         Glucometer:  Accu-Chek and contour .      Blood Glucose readings as above   Self-care:  Typical meal intake: Breakfast is   bagel, scrambled egg/oatmeal, juice    Lunches sandwich or leftovers Dinner is variable, sometimes only cereal Snacks will be yogurt or chips            Dietician visit, most recent: 08/2017               Exercise:  some walking at times  Weight history:  Wt Readings from Last 3 Encounters:  05/21/18 273 lb (123.8 kg)  05/01/18 273 lb (123.8 kg)  03/12/18 270 lb (122.5 kg)    `Glycemic control:   Lab Results  Component Value Date   HGBA1C 9.8 (H) 05/20/2018   HGBA1C 8.9 (H) 01/09/2018   HGBA1C 9.2 08/02/2017   Lab Results  Component Value Date   MICROALBUR 0.3 03/23/2016   LDLCALC 250 (H) 10/28/2015   CREATININE 0.93 05/20/2018   No results found for: [facility]  Lab Results  Component Value Date   FRUCTOSAMINE 312 (H) 03/06/2018   FRUCTOSAMINE 266 02/12/2018   FRUCTOSAMINE 266 09/02/2017      Allergies as of 07/02/2018   No Known Allergies     Medication List       Accurate as of July 02, 2018  9:12 AM. Always use your most recent med list.        ACCU-CHEK FASTCLIX LANCET Kit 1 each by Does not apply route daily. Use to check blood sugar 5 times daily.   ACCU-CHEK GUIDE ME w/Device Kit 1 each by Does not apply route daily.   acetaZOLAMIDE 250 MG tablet Commonly known as:  DIAMOX TAKE 1 TABLET (250 MG TOTAL) BY MOUTH 2 (TWO) TIMES DAILY.   ALPRAZolam 1 MG tablet Commonly known as:  XANAX TAKE 1 TABLET BY MOUTH THREE TIMES A DAY AS NEEDED   atenolol 100 MG tablet Commonly known as:   TENORMIN TAKE ONE TABLET BY MOUTH ONCE DAILY   buPROPion 150 MG 12 hr tablet Commonly known as:  WELLBUTRIN SR TAKE 1 TABLET BY MOUTH EVERY DAY   cetirizine 10 MG tablet Commonly known as:  ZYRTEC Take 1 tablet (10 mg total) by mouth daily.   Exenatide ER 2 MG Pen Commonly known as:  BYDUREON INJECT 2 MG INTO THE SKIN ONCE A WEEK.   fluticasone 50 MCG/ACT nasal spray Commonly known as:  FLONASE SPRAY 2 SPRAYS INTO EACH NOSTRIL EVERY DAY   glucose blood test strip 1 each by Other route daily. Use as instructed to check blood sugar before meals, 2 hours after meals, and before bed. DX:E11.65   glucose blood test strip 1 each by Other route as needed for other. Use as instructed to check blood sugar 4 times daily. DX:E11.65   Insulin Pen Needle 31G X 8 MM Misc Use with pen QD   insulin regular human CONCENTRATED 500 UNIT/ML injection Commonly known as:  HUMULIN R INJECT 15 UNITS SUBCUTANEOUSLY BEFORE BREAKFAST+DINNER+10 UNITS BEFORE LUNCH ADJUST AS DIRECTED.   Insulin Syringe/Needle U-500 31G X 0.5 ML Misc 1 each by Does not apply route daily. Use to inject insulin 3 times daily.   OMNIPOD DASH 5 PACK Misc 1 each by Does not apply route every 3 (three) days. APPLY ONE POD TO BODY EVERY 3 DAYS FOR INSULIN DELIVERY.   rosuvastatin 10 MG tablet Commonly known as:  CRESTOR Take 1 tablet (10 mg total) by mouth daily.   vortioxetine HBr 10 MG Tabs tablet Commonly known as:  TRINTELLIX Take 1 tablet (10 mg total) by mouth daily.       Allergies: No Known Allergies  Past Medical History:  Diagnosis Date  . Abnormal vaginal Pap smear    tx with cryotherapy  . Anxiety   . Depression   . Diabetes mellitus without complication (HCC)   . History of kidney stones    passed stone - no surgery  . Hypertension   . Migraine    otc med prn  . Obesity   . Pseudotumor cerebri   . SVD (spontaneous vaginal delivery)    x 3  . Tension headache     Past Surgical History:   Procedure Laterality Date  . ABDOMINAL HYSTERECTOMY    . ANTERIOR CERVICAL DECOMP/DISCECTOMY FUSION N/A 01/03/2016   Procedure: ANTERIOR CERVICAL DECOMPRESSION  FUSION CERVICAL FIVE-SIX,CERVICAL SIX-SEVEN.;  Surgeon: Lisbeth Renshaw, MD;  Location: [facility];  Service: Neurosurgery;  Laterality: N/A;  right side approach  . CRYOTHERAPY    . LAPAROSCOPIC ASSISTED VAGINAL HYSTERECTOMY Bilateral 04/21/2014   Procedure: LAPAROSCOPIC ASSISTED VAGINAL HYSTERECTOMY, BILATERAL SALPINGECTOMY;  Surgeon: Lavina Hamman, MD;  Location: [facility];  Service: Gynecology;  Laterality: Bilateral;  . MANDIBLE FRACTURE SURGERY  2002   x 1  . TUBAL LIGATION  11/2003  . WISDOM TOOTH EXTRACTION      Family History  Problem Relation Age of Onset  . Hypertension Mother   . Diabetes Mother   . Diabetes Sister   . Hypertension Sister     Social History:  reports that she has never smoked. She has never used smokeless tobacco. She reports current alcohol use of about 1.0 standard drinks of alcohol per week. She reports that she does not use drugs.   Review of Systems  The following is a copy of the previous note:  Lipid history: Taking Crestor for treatment and this was increased in 11/19 up to 10 mg  Triglycerides have been consistently high, LDL 105, needs follow-up   Lab Results  Component Value Date   CHOL 168 05/20/2018   CHOL 188 01/09/2018   CHOL 340 (H) 10/28/2015   Lab Results  Component Value Date   HDL 29.30 (L) 05/20/2018   HDL 29.90 (L) 01/09/2018   HDL 28 (L) 10/28/2015   Lab Results  Component Value Date   LDLCALC 250 (H) 10/28/2015   LDLCALC 110 (H) 12/31/2012   Lab Results  Component Value Date   TRIG 308.0 (H) 05/20/2018   TRIG (H) 01/09/2018    454.0 Triglyceride is over 400; calculations on Lipids are invalid.   TRIG 310 (H) 10/28/2015   Lab Results  Component Value Date   CHOLHDL 6 05/20/2018   CHOLHDL 6 01/09/2018   CHOLHDL 12.1 (H) 10/28/2015   Lab Results    Component Value Date   LDLDIRECT 105.0 05/20/2018   LDLDIRECT 107.0 01/09/2018   LDLDIRECT 122 07/27/2016        Has mild increase in ALT, chronic stable     Lab Results  Component Value Date   ALT 54 (H) 05/20/2018   Hypertension: This is being treated with atenolol 100 mg by her PCP  BP Readings from Last 3 Encounters:  05/21/18 118/78  05/08/18 118/79  05/01/18 122/76      Most recent foot exam:2/19     LABS:  Office Visit on 07/01/2018  Component Date Value Ref Range Status  . POC Glucose 07/01/2018 178* 70 - 99 mg/dl Final    Physical Examination:  Ht 5\' 1"  (1.549 m)   LMP 09/23/2013   BMI 51.58 kg/m        ASSESSMENT:  Diabetes type 2, with morbid obesity     Her last A1c was 9.8  Currently on a regimen of insulin pump using U-500 insulin, Bydureon and metformin low-dose  She is having much better blood sugar control since starting the OmniPod pump especially yesterday Although her blood sugar is high this morning and rising this may be partly related to a rebound from a mild hypoglycemic episode earlier this morning and also from not doing her morning bolus which may help cover her morning rise in blood sugar also Blood sugars were low normal yesterday at dinnertime otherwise fairly good She is trying to adjust her boluses a little based on her carbohydrate intake and meal size  also   PLAN:    She we will change her basal rates as follows:  Midnight = 0.8 and 5 PM = 1.0, she is able to do this on her own  No change in boluses but adjust them based on portions and carbohydrate intake  Continue improving diet  To call if blood sugars are not consistently controlled in the next few days otherwise follow-up in 3 weeks  We will also need repeat lipids at some point  There are no Patient Instructions on file for this visit.        Reather Littler 07/02/2018, 9:12 AM   Note: This office note was prepared with Dragon voice recognition system  technology. Any transcriptional errors that result from this process are unintentional.

## 2018-07-04 DIAGNOSIS — E1165 Type 2 diabetes mellitus with hyperglycemia: Secondary | ICD-10-CM | POA: Diagnosis not present

## 2018-07-05 ENCOUNTER — Other Ambulatory Visit: Payer: Self-pay | Admitting: Endocrinology

## 2018-07-05 ENCOUNTER — Other Ambulatory Visit: Payer: Self-pay | Admitting: Family Medicine

## 2018-07-08 ENCOUNTER — Telehealth: Payer: Self-pay | Admitting: Family Medicine

## 2018-07-08 NOTE — Telephone Encounter (Signed)
ok 

## 2018-07-08 NOTE — Telephone Encounter (Signed)
Pt called and states she is going out of town on Thursday evening and would like to know if she can get her Xanax refilled early?? If so we need to call the pharmacy to give ok to refill early.  (Only 4 days early)

## 2018-07-08 NOTE — Patient Instructions (Signed)
Continue to test before meals and at bedtime Call if blood sugars drop low or remain high.

## 2018-07-08 NOTE — Progress Notes (Signed)
Patient reported no difficulty filling, applying and using pods and pdm.  She reports rotating pump sites appropriately.  We reviewed each item on the checklist and she reported good understanding of all topics and not final questions.  She signed off the checklist ans understanding all topics.

## 2018-07-09 NOTE — Telephone Encounter (Signed)
Called [facility] giving permission to refill xanax early also lmov[facility] for pt to be aware of early refill.

## 2018-07-23 ENCOUNTER — Ambulatory Visit: Payer: Medicaid Other | Admitting: Endocrinology

## 2018-07-24 ENCOUNTER — Ambulatory Visit: Payer: Medicaid Other | Admitting: Endocrinology

## 2018-07-28 ENCOUNTER — Telehealth: Payer: Self-pay | Admitting: Endocrinology

## 2018-07-28 NOTE — Telephone Encounter (Signed)
Patient stated that [facility] she has called to see of other pharmacies have them but none of them she contacted do. She would like to see if we have samples or if there is another way that she could get these. The patient is completely out   Please advise

## 2018-07-28 NOTE — Telephone Encounter (Signed)
Called pt and informed her that we have prescribed her OmniPods to a [facility], not her [facility] and exercise psychologist. Pt stated that at the time of her call, she did not realize this, but she does now. She has already contacted the mail order pharmacy and they will be overnighting her shipment and she will receive her pods by 10am tomorrow. Pt was advised that she can come to our office and get a sample pod to get her through until her shipment arrives. Pt verbalized understanding.

## 2018-08-01 DIAGNOSIS — E1165 Type 2 diabetes mellitus with hyperglycemia: Secondary | ICD-10-CM | POA: Diagnosis not present

## 2018-08-04 ENCOUNTER — Other Ambulatory Visit: Payer: Self-pay | Admitting: Family Medicine

## 2018-08-19 ENCOUNTER — Ambulatory Visit: Payer: Medicaid Other | Admitting: Endocrinology

## 2018-08-22 ENCOUNTER — Other Ambulatory Visit: Payer: Self-pay | Admitting: *Deleted

## 2018-08-22 MED ORDER — BUPROPION HCL ER (SR) 150 MG PO TB12: 150.0000 mg | ORAL_TABLET | Freq: Every day | ORAL | 5 refills | Status: DC

## 2018-08-22 MED ORDER — CETIRIZINE HCL 10 MG PO TABS: 10.0000 mg | ORAL_TABLET | Freq: Every day | ORAL | 11 refills | Status: DC

## 2018-08-25 ENCOUNTER — Encounter: Payer: Self-pay | Admitting: Family Medicine

## 2018-08-25 ENCOUNTER — Ambulatory Visit: Payer: Medicaid Other | Admitting: Family Medicine

## 2018-08-25 VITALS — BP 112/64 | HR 66 | Temp 98.2°F | Resp 18 | Ht 61.0 in | Wt 274.0 lb

## 2018-08-25 DIAGNOSIS — H1131 Conjunctival hemorrhage, right eye: Secondary | ICD-10-CM

## 2018-08-25 DIAGNOSIS — J019 Acute sinusitis, unspecified: Secondary | ICD-10-CM | POA: Diagnosis not present

## 2018-08-25 DIAGNOSIS — B9689 Other specified bacterial agents as the cause of diseases classified elsewhere: Secondary | ICD-10-CM | POA: Diagnosis not present

## 2018-08-25 MED ORDER — AMOXICILLIN-POT CLAVULANATE 875-125 MG PO TABS: 1.0000 | ORAL_TABLET | Freq: Two times a day (BID) | ORAL | 0 refills | Status: DC

## 2018-08-25 MED ORDER — FLUCONAZOLE 150 MG PO TABS: 150.0000 mg | ORAL_TABLET | Freq: Once | ORAL | 0 refills | Status: AC

## 2018-08-25 NOTE — Progress Notes (Signed)
Subjective:    Patient ID: Kimberly [name], female    DOB: Nov 22, 1970, 48 y.o.   MRN: [id number]  Patient has a past medical history of pseudotumor cerebri.  This was diagnosed when she was 27.  She is on Aceta Sulamyd for that.  She has not had any headaches from that and almost 20 years.  2-1/2 weeks ago, she developed a head cold.  Symptoms are characterized by sinus symptoms such as pressure in her maxillary sinuses, rhinorrhea, and head congestion.  Thursday of last week she developed significant pain between her eyes.  She also has pain in both maxillary sinuses.  She has pain in her teeth.  Friday night, her eyes were itching.  She was rubbing them vigorously.  Saturday morning she woke up with subconjunctival hemorrhage in her right eye.  She denies any blurry vision or vision loss on the right.  She has normal extraocular motion.  Pupils are equal round reactive to light.  There is no papilledema in the right eye.  Quite honestly I am having a difficult time seeing the optic disc in the left eye to assess for papilledema.  She denies any photophobia or phonophobia.  She denies any nausea or vomiting.  Headache is a constant pressure-like pain between the eyeballs more akin to a sinus infection Past Medical History:  Diagnosis Date  . Abnormal vaginal Pap smear    tx with cryotherapy  . Anxiety   . Depression   . Diabetes mellitus without complication (HCC)   . History of kidney stones    passed stone - no surgery  . Hypertension   . Migraine    otc med prn  . Obesity   . Pseudotumor cerebri   . SVD (spontaneous vaginal delivery)    x 3  . Tension headache    Past Surgical History:  Procedure Laterality Date  . ABDOMINAL HYSTERECTOMY    . ANTERIOR CERVICAL DECOMP/DISCECTOMY FUSION N/A 01/03/2016   Procedure: ANTERIOR CERVICAL DECOMPRESSION FUSION CERVICAL FIVE-SIX,CERVICAL SIX-SEVEN.;  Surgeon: Lisbeth Renshaw, MD;  Location: [facility];  Service: Neurosurgery;  Laterality:  N/A;  right side approach  . CRYOTHERAPY    . LAPAROSCOPIC ASSISTED VAGINAL HYSTERECTOMY Bilateral 04/21/2014   Procedure: LAPAROSCOPIC ASSISTED VAGINAL HYSTERECTOMY, BILATERAL SALPINGECTOMY;  Surgeon: Lavina Hamman, MD;  Location: [facility];  Service: Gynecology;  Laterality: Bilateral;  . MANDIBLE FRACTURE SURGERY  2002   x 1  . TUBAL LIGATION  11/2003  . WISDOM TOOTH EXTRACTION     Current Outpatient Medications on File Prior to Visit  Medication Sig Dispense Refill  . acetaZOLAMIDE (DIAMOX) 250 MG tablet TAKE 1 TABLET BY MOUTH TWICE A DAY 30 tablet 1  . ALPRAZolam (XANAX) 1 MG tablet TAKE 1 TABLET BY MOUTH THREE TIMES A DAY AS NEEDED 90 tablet 2  . atenolol (TENORMIN) 100 MG tablet TAKE ONE TABLET BY MOUTH ONCE DAILY 30 tablet 5  . Blood Glucose Monitoring Suppl (ACCU-CHEK GUIDE ME) w/Device KIT 1 each by Does not apply route daily. 1 kit 0  . buPROPion (WELLBUTRIN SR) 150 MG 12 hr tablet Take 1 tablet (150 mg total) by mouth daily. 30 tablet 5  . cetirizine (ZYRTEC) 10 MG tablet Take 1 tablet (10 mg total) by mouth daily. 30 tablet 11  . Exenatide ER (BYDUREON) 2 MG PEN INJECT 2 MG INTO THE SKIN ONCE A WEEK. 4 each 2  . fluticasone (FLONASE) 50 MCG/ACT nasal spray SPRAY 2 SPRAYS INTO EACH NOSTRIL EVERY DAY 16  g 6  . glucose blood test strip 1 each by Other route daily. Use as instructed to check blood sugar before meals, 2 hours after meals, and before bed. DX:E11.65 250 each 3  . glucose blood test strip 1 each by Other route as needed for other. Use as instructed to check blood sugar 4 times daily. DX:E11.65 200 each 11  . Insulin Disposable Pump (OMNIPOD DASH 5 PACK) MISC 1 each by Does not apply route every 3 (three) days. APPLY ONE POD TO BODY EVERY 3 DAYS FOR INSULIN DELIVERY. 10 each 11  . Insulin Pen Needle 31G X 8 MM MISC Use with pen QD 100 each 3  . insulin regular human CONCENTRATED (HUMULIN R) 500 UNIT/ML injection INJECT 15 UNITS SUBCUTANEOUSLY BEFORE BREAKFAST+DINNER+10 UNITS  BEFORE LUNCH ADJUST AS DIRECTED. 20 mL 12  . Insulin Syringe/Needle U-500 31G X 0.5 ML MISC 1 each by Does not apply route daily. Use to inject insulin 3 times daily. 100 each 3  . Lancets Misc. (ACCU-CHEK FASTCLIX LANCET) KIT 1 each by Does not apply route daily. Use to check blood sugar 5 times daily. 1 kit 12  . rosuvastatin (CRESTOR) 10 MG tablet Take 1 tablet (10 mg total) by mouth daily. 90 tablet 3  . vortioxetine HBr (TRINTELLIX) 10 MG TABS tablet Take 1 tablet (10 mg total) by mouth daily. 30 tablet 5   No current facility-administered medications on file prior to visit.    No Known Allergies Social History   Socioeconomic History  . Marital status: Divorced    Spouse name: Not on file  . Number of children: Not on file  . Years of education: Not on file  . Highest education level: Not on file  Occupational History  . Not on file  Social Needs  . Financial resource strain: Not on file  . Food insecurity:    Worry: Not on file    Inability: Not on file  . Transportation needs:    Medical: Not on file    Non-medical: Not on file  Tobacco Use  . Smoking status: Never Smoker  . Smokeless tobacco: Never Used  Substance and Sexual Activity  . Alcohol use: Yes    Alcohol/week: 1.0 standard drinks    Types: 1 Glasses of wine per week    Comment: one glass of wine per week  . Drug use: No  . Sexual activity: Yes    Birth control/protection: Surgical  Lifestyle  . Physical activity:    Days per week: Not on file    Minutes per session: Not on file  . Stress: Not on file  Relationships  . Social connections:    Talks on phone: Not on file    Gets together: Not on file    Attends religious service: Not on file    Active member of club or organization: Not on file    Attends meetings of clubs or organizations: Not on file    Relationship status: Not on file  . Intimate partner violence:    Fear of current or ex partner: Not on file    Emotionally abused: Not on  file    Physically abused: Not on file    Forced sexual activity: Not on file  Other Topics Concern  . Not on file  Social History Narrative  . Not on file     Review of Systems  All other systems reviewed and are negative.      Objective:   Physical  Exam  Constitutional: She appears well-developed and well-nourished. No distress.  HENT:  Right Ear: External ear normal.  Left Ear: External ear normal.  Nose: Mucosal edema present. Right sinus exhibits maxillary sinus tenderness and frontal sinus tenderness. Left sinus exhibits maxillary sinus tenderness and frontal sinus tenderness.  Mouth/Throat: Oropharynx is clear and moist.  Eyes: Pupils are equal, round, and reactive to light. EOM and lids are normal. Lids are everted and swept, no foreign bodies found. Right eye exhibits no discharge and no exudate. Left eye exhibits no discharge and no exudate. Right conjunctiva is not injected. Right conjunctiva has a hemorrhage. Left conjunctiva is not injected. Left conjunctiva has no hemorrhage.  Fundoscopic exam:      The right eye shows no hemorrhage and no papilledema.       The left eye shows no hemorrhage.  Neck: Neck supple.  Cardiovascular: Normal rate, regular rhythm and normal heart sounds.  Pulmonary/Chest: Effort normal and breath sounds normal. No respiratory distress. She has no wheezes. She has no rales.  Lymphadenopathy:    She has no cervical adenopathy.  Skin: She is not diaphoretic.  Vitals reviewed.         Assessment & Plan:  I believe the patient has a sinus infection with a sinus headache as well as a capillary hemorrhage/subconjunctival hemorrhage in the right eye.  I see no evidence of increased intracranial pressure on her exam or on her history.  Treat the patient for sinusitis with Flonase and Augmentin 875 mg p.o. twice daily for 10 days.  She can use ibuprofen for the headache.  If the headache worsens, consider evaluation for increased intracranial  pressure although the patient has not had any recent weight gain that would trigger increased intracranial pressure.  I have recommended clinical monitoring for the capillary hemorrhage in the right eye.  If she develops any blurry vision or eye pain, she will need to see ophthalmology.

## 2018-08-26 ENCOUNTER — Telehealth: Payer: Self-pay | Admitting: Family Medicine

## 2018-08-26 DIAGNOSIS — H52229 Regular astigmatism, unspecified eye: Secondary | ICD-10-CM | POA: Diagnosis not present

## 2018-08-26 NOTE — Telephone Encounter (Signed)
Pt aware and will call to make the apt

## 2018-08-26 NOTE — Telephone Encounter (Signed)
Recommend eye doctor evaluation today.

## 2018-08-26 NOTE — Telephone Encounter (Signed)
Pt lvm on 08/26/2018 stating that her eye is worse than yesterday at her app. She wants to know if she will need to come back in, if we can call in something, or if she would need to go to eye dr.   Please advise.

## 2018-08-27 ENCOUNTER — Encounter: Payer: Self-pay | Admitting: Endocrinology

## 2018-08-27 ENCOUNTER — Other Ambulatory Visit: Payer: Self-pay

## 2018-08-27 ENCOUNTER — Ambulatory Visit: Payer: Medicaid Other | Admitting: Endocrinology

## 2018-08-27 VITALS — BP 118/76 | HR 98 | Ht 61.0 in | Wt 279.4 lb

## 2018-08-27 DIAGNOSIS — H1131 Conjunctival hemorrhage, right eye: Secondary | ICD-10-CM | POA: Diagnosis not present

## 2018-08-27 DIAGNOSIS — Z794 Long term (current) use of insulin: Secondary | ICD-10-CM | POA: Diagnosis not present

## 2018-08-27 DIAGNOSIS — G932 Benign intracranial hypertension: Secondary | ICD-10-CM | POA: Diagnosis not present

## 2018-08-27 DIAGNOSIS — E782 Mixed hyperlipidemia: Secondary | ICD-10-CM

## 2018-08-27 DIAGNOSIS — E1165 Type 2 diabetes mellitus with hyperglycemia: Secondary | ICD-10-CM | POA: Diagnosis not present

## 2018-08-27 LAB — GLUCOSE, POCT (MANUAL RESULT ENTRY)
POC Glucose: 61 mg/dl — AB (ref 70–99)
POC Glucose: 81 mg/dl (ref 70–99)

## 2018-08-27 LAB — POCT GLYCOSYLATED HEMOGLOBIN (HGB A1C): Hemoglobin A1C: 7.3 % — AB (ref 4.0–5.6)

## 2018-08-27 MED ORDER — OMNIPOD DASH PODS (GEN 4) MISC: 1.0000 | 11 refills | Status: DC

## 2018-08-27 NOTE — Progress Notes (Signed)
[email or web address]

## 2018-08-27 NOTE — Patient Instructions (Signed)
Check blood sugars on waking up 7 days a week  Also check blood sugars about 2 hours after meals and do this after different meals by rotation  Recommended blood sugar levels on waking up are 90-130 and about 2 hours after meal is 130-160  Please bring your blood sugar monitor to each visit, thank you  Bolus for all meals

## 2018-08-27 NOTE — Progress Notes (Signed)
Patient ID: Kimberly [name], female   DOB: Oct 29, 1970, 48 y.o.   MRN: [id number]          Reason for Appointment: Follow-up for Type 2 Diabetes    History of Present Illness:          Date of diagnosis of type 2 diabetes mellitus: 12/2014   Background history:   She was initially tried on metformin but because of side effects as was stopped and she was tried on glipizide and subsequently [place].  She apparently took Jardiance for at least a couple of months but because of recurrent yeast infection she stopped it and she did not think it was helping her glucose also He was started on Lantus insulin in 12/2015 Subsequently Bydureon was added in 03/2016  Recent history:   INSULIN regimen is:  Basal rate:  MN: 0.8, 5 AM = 1.3 and 5 PM = 1.1.  Total basal insulin 27 units I/C ratio 10, target 140 with corrections over 120, ISF: 60, and timing: 6 hours. Boluses 12 units with meals   Non-insulin hypoglycemic drugs the patient is taking are: Bydureon 2 mg weekly, metformin ER 500 mg twice daily  Most recent A1c is 7.3, previously 9.8  Current management, blood sugar patterns and problems identified:  She has continued using the Omni pod pump  However she is a says that she is occasionally having trouble with getting refills for the pods  She is checking her blood sugars mostly with her Accu-Chek meter and as before not entering all of them in the pump  She is not bolusing periodically and occasionally dysphagia from not taking the PDM with her to work  However in the last 2 weeks she has bolused only 2 times in the evening at dinnertime according to her pump download  She is mostly entering about 100 g of carbohydrates for her mealtime coverage  Today with eating a peanut butter sandwich, and entering 100 g of carbohydrates she has a low sugar in the afternoon today in the office at 61  However difficult to assess her control because of lack of adequate monitoring  She is mostly  having high readings in the mornings although has a few good readings also        Side effects from medications have been: Nausea and diarrhea from high dose metformin, recurrent yeast infections from Jardiance  Glucose monitoring:  About 1-2 times a day         Glucometer:  Accu-Chek mostly .      Blood Glucose readings   PRE-MEAL Fasting Lunch Dinner Bedtime Overall  Glucose range:  110-228  74-160  90-168  208, 212   Mean/median:  160   127   141   POST-MEAL PC Breakfast  afternoon PC Dinner  Glucose range:   84-199   Mean/median:         Self-care:  Typical meal intake: Breakfast is   bagel, scrambled egg/oatmeal, juice    Lunches sandwich or leftovers Dinner is variable, sometimes only cereal Snacks will be yogurt or chips            Dietician visit, most recent: 08/2017               Exercise:  some walking at times  Weight history:  Wt Readings from Last 3 Encounters:  08/27/18 279 lb 6.4 oz (126.7 kg)  08/25/18 274 lb (124.3 kg)  05/21/18 273 lb (123.8 kg)    `Glycemic control:  Lab Results  Component Value Date   HGBA1C 9.8 (H) 05/20/2018   HGBA1C 8.9 (H) 01/09/2018   HGBA1C 9.2 08/02/2017   Lab Results  Component Value Date   MICROALBUR 0.3 03/23/2016   LDLCALC 250 (H) 10/28/2015   CREATININE 0.93 05/20/2018   No results found for: [facility]  Lab Results  Component Value Date   FRUCTOSAMINE 312 (H) 03/06/2018   FRUCTOSAMINE 266 02/12/2018   FRUCTOSAMINE 266 09/02/2017      Allergies as of 08/27/2018   No Known Allergies     Medication List       Accurate as of August 27, 2018  4:22 PM. Always use your most recent med list.        ACCU-CHEK FASTCLIX LANCET Kit 1 each by Does not apply route daily. Use to check blood sugar 5 times daily.   ACCU-CHEK GUIDE ME w/Device Kit 1 each by Does not apply route daily.   acetaZOLAMIDE 250 MG tablet Commonly known as:  DIAMOX TAKE 1 TABLET BY MOUTH TWICE A DAY   ALPRAZolam 1 MG  tablet Commonly known as:  XANAX TAKE 1 TABLET BY MOUTH THREE TIMES A DAY AS NEEDED   amoxicillin-clavulanate 875-125 MG tablet Commonly known as:  AUGMENTIN Take 1 tablet by mouth 2 (two) times daily.   atenolol 100 MG tablet Commonly known as:  TENORMIN TAKE ONE TABLET BY MOUTH ONCE DAILY   buPROPion 150 MG 12 hr tablet Commonly known as:  WELLBUTRIN SR Take 1 tablet (150 mg total) by mouth daily.   cetirizine 10 MG tablet Commonly known as:  ZYRTEC Take 1 tablet (10 mg total) by mouth daily.   Exenatide ER 2 MG Pen Commonly known as:  BYDUREON INJECT 2 MG INTO THE SKIN ONCE A WEEK.   fluticasone 50 MCG/ACT nasal spray Commonly known as:  FLONASE SPRAY 2 SPRAYS INTO EACH NOSTRIL EVERY DAY   glucose blood test strip 1 each by Other route daily. Use as instructed to check blood sugar before meals, 2 hours after meals, and before bed. DX:E11.65   glucose blood test strip 1 each by Other route as needed for other. Use as instructed to check blood sugar 4 times daily. DX:E11.65   Insulin Pen Needle 31G X 8 MM Misc Use with pen QD   insulin regular human CONCENTRATED 500 UNIT/ML injection Commonly known as:  HUMULIN R INJECT 15 UNITS SUBCUTANEOUSLY BEFORE BREAKFAST+DINNER+10 UNITS BEFORE LUNCH ADJUST AS DIRECTED.   Insulin Syringe/Needle U-500 31G X 0.5 ML Misc 1 each by Does not apply route daily. Use to inject insulin 3 times daily.   OMNIPOD DASH 5 PACK Misc 1 each by Does not apply route every 3 (three) days. APPLY ONE POD TO BODY EVERY 3 DAYS FOR INSULIN DELIVERY.   rosuvastatin 10 MG tablet Commonly known as:  CRESTOR Take 1 tablet (10 mg total) by mouth daily.   vortioxetine HBr 10 MG Tabs tablet Commonly known as:  TRINTELLIX Take 1 tablet (10 mg total) by mouth daily.       Allergies: No Known Allergies  Past Medical History:  Diagnosis Date  . Abnormal vaginal Pap smear    tx with cryotherapy  . Anxiety   . Depression   . Diabetes mellitus  without complication (HCC)   . History of kidney stones    passed stone - no surgery  . Hypertension   . Migraine    otc med prn  . Obesity   . Pseudotumor cerebri   .  SVD (spontaneous vaginal delivery)    x 3  . Tension headache     Past Surgical History:  Procedure Laterality Date  . ABDOMINAL HYSTERECTOMY    . ANTERIOR CERVICAL DECOMP/DISCECTOMY FUSION N/A 01/03/2016   Procedure: ANTERIOR CERVICAL DECOMPRESSION FUSION CERVICAL FIVE-SIX,CERVICAL SIX-SEVEN.;  Surgeon: Lisbeth Renshaw, MD;  Location: [facility];  Service: Neurosurgery;  Laterality: N/A;  right side approach  . CRYOTHERAPY    . LAPAROSCOPIC ASSISTED VAGINAL HYSTERECTOMY Bilateral 04/21/2014   Procedure: LAPAROSCOPIC ASSISTED VAGINAL HYSTERECTOMY, BILATERAL SALPINGECTOMY;  Surgeon: Lavina Hamman, MD;  Location: [facility];  Service: Gynecology;  Laterality: Bilateral;  . MANDIBLE FRACTURE SURGERY  2002   x 1  . TUBAL LIGATION  11/2003  . WISDOM TOOTH EXTRACTION      Family History  Problem Relation Age of Onset  . Hypertension Mother   . Diabetes Mother   . Diabetes Sister   . Hypertension Sister     Social History:  reports that she has never smoked. She has never used smokeless tobacco. She reports current alcohol use of about 1.0 standard drinks of alcohol per week. She reports that she does not use drugs.   Review of Systems   Lipid history: Taking Crestor for treatment and this was increased in 11/19 up to 10 mg  Triglycerides have been consistently high, LDL 105, needs follow-up   Lab Results  Component Value Date   CHOL 168 05/20/2018   CHOL 188 01/09/2018   CHOL 340 (H) 10/28/2015   Lab Results  Component Value Date   HDL 29.30 (L) 05/20/2018   HDL 29.90 (L) 01/09/2018   HDL 28 (L) 10/28/2015   Lab Results  Component Value Date   LDLCALC 250 (H) 10/28/2015   LDLCALC 110 (H) 12/31/2012   Lab Results  Component Value Date   TRIG 308.0 (H) 05/20/2018   TRIG (H) 01/09/2018    454.0  Triglyceride is over 400; calculations on Lipids are invalid.   TRIG 310 (H) 10/28/2015   Lab Results  Component Value Date   CHOLHDL 6 05/20/2018   CHOLHDL 6 01/09/2018   CHOLHDL 12.1 (H) 10/28/2015   Lab Results  Component Value Date   LDLDIRECT 105.0 05/20/2018   LDLDIRECT 107.0 01/09/2018   LDLDIRECT 122 07/27/2016        Has mild increase in ALT, chronic stable     Lab Results  Component Value Date   ALT 54 (H) 05/20/2018   Hypertension: This is being treated with atenolol 100 mg by her PCP  BP Readings from Last 3 Encounters:  08/27/18 118/76  08/25/18 112/64  05/21/18 118/78    Most recent foot exam:2/19  She is on Trintellix for depression  LABS:  No visits with results within 1 Week(s) from this visit.  Latest known visit with results is:  Office Visit on 07/01/2018  Component Date Value Ref Range Status  . POC Glucose 07/01/2018 178* 70 - 99 mg/dl Final    Physical Examination:  BP 118/76 (BP Location: Left Arm, Patient Position: Sitting, Cuff Size: Large)   Pulse 98   Ht 5\' 1"  (1.549 m)   Wt 279 lb 6.4 oz (126.7 kg)   LMP 09/23/2013   SpO2 97%   BMI 52.79 kg/m        ASSESSMENT:  Diabetes type 2, with morbid obesity     Her last A1c was 9.8 and is much better now at 7.3  Currently on a regimen of insulin pump using U-500 insulin, Bydureon  and metformin low-dose  Although her blood sugars are better she has some variability Fasting readings are not consistent Most of her high readings in the mornings are likely to be from lack of coverage for her evening meal frequently She is also not motivated to check her sugars as much including after meals Currently not taking boluses for several of her meals and not taking her PDM with her to work consistently Blood sugars are tending to be lower in the afternoon Today appears to have had some hypoglycemia which is relatively new and may be related to a relatively small lunch Most likely does not  need to take more than 10 units bolus for her average meals including at first Difficult to assess her bolus requirement at dinnertime because of lack of postprandial monitoring  LIPIDS: Has had persistently high triglycerides partly from hyperglycemia   PLAN:    She we will change her basal rates as follows:  5 AM-12 noon = 1.3, 12 noon-5 PM = 1.1 otherwise the rest is the same  She will bolus only for 80 g of carbohydrate at lunch for average meals  Otherwise she will try not to bolus more than 10 units in the morning  Consistent day-to-day management with monitoring and boluses as discussed in detail today  To bolus 30-minute before eating  Low-fat meals  To call if having any tendency to low sugars  We will also need repeat lipids on the next visit fasting  New prescription sent for her insulin pods and also given her local contact number for representative for resolving issues with supplies  Counseling time on subjects discussed in assessment and plan sections is over 50% of today's 25 minute visit  There are no Patient Instructions on file for this visit.        Reather Littler 08/27/2018, 4:22 PM   Note: This office note was prepared with Dragon voice recognition system technology. Any transcriptional errors that result from this process are unintentional.

## 2018-08-29 DIAGNOSIS — E1165 Type 2 diabetes mellitus with hyperglycemia: Secondary | ICD-10-CM | POA: Diagnosis not present

## 2018-09-01 ENCOUNTER — Other Ambulatory Visit: Payer: Self-pay | Admitting: Family Medicine

## 2018-09-02 NOTE — Telephone Encounter (Signed)
Ok to refill Xanax??  Last office visit 08/25/2018.  Last refill 06/13/2018, #2 refills.

## 2018-09-12 ENCOUNTER — Other Ambulatory Visit: Payer: Self-pay | Admitting: Endocrinology

## 2018-09-17 ENCOUNTER — Other Ambulatory Visit: Payer: Self-pay | Admitting: *Deleted

## 2018-09-17 MED ORDER — ACETAZOLAMIDE 250 MG PO TABS: 250.0000 mg | ORAL_TABLET | Freq: Two times a day (BID) | ORAL | 1 refills | Status: DC

## 2018-09-26 ENCOUNTER — Ambulatory Visit: Payer: Medicaid Other | Admitting: Family Medicine

## 2018-10-03 DIAGNOSIS — E1165 Type 2 diabetes mellitus with hyperglycemia: Secondary | ICD-10-CM | POA: Diagnosis not present

## 2018-10-21 ENCOUNTER — Other Ambulatory Visit: Payer: Self-pay

## 2018-10-21 ENCOUNTER — Other Ambulatory Visit (INDEPENDENT_AMBULATORY_CARE_PROVIDER_SITE_OTHER): Payer: Medicaid Other

## 2018-10-21 DIAGNOSIS — E782 Mixed hyperlipidemia: Secondary | ICD-10-CM | POA: Diagnosis not present

## 2018-10-21 DIAGNOSIS — Z794 Long term (current) use of insulin: Secondary | ICD-10-CM | POA: Diagnosis not present

## 2018-10-21 DIAGNOSIS — E1165 Type 2 diabetes mellitus with hyperglycemia: Secondary | ICD-10-CM | POA: Diagnosis not present

## 2018-10-21 LAB — COMPREHENSIVE METABOLIC PANEL
ALT: 29 U/L (ref 0–35)
AST: 21 U/L (ref 0–37)
Albumin: 3.8 g/dL (ref 3.5–5.2)
Alkaline Phosphatase: 86 U/L (ref 39–117)
BUN: 18 mg/dL (ref 6–23)
CO2: 22 mEq/L (ref 19–32)
Calcium: 8.5 mg/dL (ref 8.4–10.5)
Chloride: 110 mEq/L (ref 96–112)
Creatinine, Ser: 0.89 mg/dL (ref 0.40–1.20)
GFR: 67.72 mL/min (ref >60.00)
Glucose, Bld: 102 mg/dL — ABNORMAL HIGH (ref 70–99)
Potassium: 3.9 mEq/L (ref 3.5–5.1)
Sodium: 139 mEq/L (ref 135–145)
Total Bilirubin: 0.6 mg/dL (ref 0.2–1.2)
Total Protein: 6.6 g/dL (ref 6.0–8.3)

## 2018-10-21 LAB — LIPID PANEL
Cholesterol: 154 mg/dL (ref 0–200)
HDL: 33.9 mg/dL — ABNORMAL LOW (ref >39.00)
LDL Cholesterol: 90 mg/dL (ref 0–99)
NonHDL: 120.05
Total CHOL/HDL Ratio: 5
Triglycerides: 149 mg/dL (ref 0.0–149.0)
VLDL: 29.8 mg/dL (ref 0.0–40.0)

## 2018-10-21 LAB — MICROALBUMIN / CREATININE URINE RATIO
Creatinine,U: 168.6 mg/dL
Microalb Creat Ratio: 1.1 mg/g (ref 0.0–30.0)
Microalb, Ur: 1.9 mg/dL (ref 0.0–1.9)

## 2018-10-22 LAB — FRUCTOSAMINE: Fructosamine: 218 umol/L (ref 0–285)

## 2018-10-24 ENCOUNTER — Ambulatory Visit (INDEPENDENT_AMBULATORY_CARE_PROVIDER_SITE_OTHER): Payer: Medicaid Other | Admitting: Endocrinology

## 2018-10-24 ENCOUNTER — Encounter: Payer: Self-pay | Admitting: Endocrinology

## 2018-10-24 DIAGNOSIS — E782 Mixed hyperlipidemia: Secondary | ICD-10-CM

## 2018-10-24 DIAGNOSIS — E1165 Type 2 diabetes mellitus with hyperglycemia: Secondary | ICD-10-CM

## 2018-10-24 DIAGNOSIS — Z794 Long term (current) use of insulin: Secondary | ICD-10-CM

## 2018-10-24 MED ORDER — EXENATIDE ER 2 MG ~~LOC~~ PEN: PEN_INJECTOR | SUBCUTANEOUS | 2 refills | Status: DC

## 2018-10-24 NOTE — Progress Notes (Signed)
Patient ID: Kimberly [name], female   DOB: 09-Aug-1970, 48 y.o.   MRN: [id number]          Reason for Appointment: Follow-up for Type 2 Diabetes   Today's office visit was provided via telemedicine using video technique Explained to the patient and the the limitations of evaluation and management by telemedicine and the availability of in person appointments.  The patient understood the limitations and agreed to proceed. Patient also understood that the telehealth visit is billable. . Location of the patient: Home . Location of the provider: Office Only the patient and myself were participating in the encounter  Patient's glucose meter and insulin pump were downloaded and these were reviewed in detail prior to her video session Relevant sections of her medical record and labs were also reviewed prior to contact  History of Present Illness:          Date of diagnosis of type 2 diabetes mellitus: 12/2014   Background history:   She was initially tried on metformin but because of side effects as was stopped and she was tried on glipizide and subsequently [place].  She apparently took Jardiance for at least a couple of months but because of recurrent yeast infection she stopped it and she did not think it was helping her glucose also He was started on Lantus insulin in 12/2015 Subsequently Bydureon was added in 03/2016  Recent history:   INSULIN regimen with Omni pod/Dash::  Basal rate:  MN: 0.8, 5 AM = 1.3, 12 PM = 1.1, 5 PM = 1.0.  Total basal insulin 25.6 units I/C ratio 10, target 140 with corrections over 120, ISF: 60, and timing: 6 hours. Boluses 8-12 units with meals   Non-insulin hypoglycemic drugs the patient is taking are: Bydureon 2 mg weekly, metformin ER 500 mg twice daily  Most recent A1c is 7.3, previously 9.8 Fructosamine is now 218  Current management, blood sugar patterns and problems identified:  She has not been checking her blood sugars very much as directed   Also she thinks that because of not being allowed to take her testing improvement [place] [employer] [place] she is not checking her lungs  However since she is likely using 2 different readings did not have time any blood sugars available  Also frequently not entering her blood sugars in the pump  FASTING blood sugars appear to be fairly consistently high  She thinks that this is partly related to having some snacks which may be either some crackers or chips  However she appears not to be BOLUSING much at all in the last 2 weeks of her pump download has only for boluses for breakfast and 1 each for lunch and dinner  However she now thinks that recently she feels her sugars getting low before dinnertime and yesterday she was in the 60s before dinnertime even without a bolus  She is now starting a weight watchers program for the last 2 weeks but not clear if she has lost any weight  She thinks she is taking Bydureon regularly  Blood sugars rest of the day appear to be relatively better  Exercise: Very little, occasionally walking        Side effects from medications have been: Nausea and diarrhea from high dose metformin, recurrent yeast infections from Jardiance  Glucose monitoring:  About 1-2 times a day         Glucometer:  Accu-Chek mostly .      Blood Glucose readings  PRE-MEAL Fasting Lunch Dinner Bedtime Overall  Glucose range:  166-277  96-229  77  170   Mean/median:     ?   POST-MEAL PC Breakfast PC Lunch PC Dinner  Glucose range:   79, 152   Mean/median:       PREVIOUS readings:  PRE-MEAL Fasting Lunch Dinner Bedtime Overall  Glucose range:  110-228  74-160  90-168  208, 212   Mean/median:  160   127   141   POST-MEAL PC Breakfast  afternoon PC Dinner  Glucose range:   84-199   Mean/median:        Self-care:  Typical meal intake: Breakfast is   bagel, scrambled egg/oatmeal, juice    Lunch may be sandwich or leftovers Dinner is variable, Snacks will  be yogurt or chips            Dietician visit, most recent: 08/2017              Weight history:  Wt Readings from Last 3 Encounters:  08/27/18 279 lb 6.4 oz (126.7 kg)  08/25/18 274 lb (124.3 kg)  05/21/18 273 lb (123.8 kg)    `Glycemic control:   Lab Results  Component Value Date   HGBA1C 7.3 (A) 08/27/2018   HGBA1C 9.8 (H) 05/20/2018   HGBA1C 8.9 (H) 01/09/2018   Lab Results  Component Value Date   MICROALBUR 1.9 10/21/2018   LDLCALC 90 10/21/2018   CREATININE 0.89 10/21/2018   Lab Results  Component Value Date   MICRALBCREAT 1.1 10/21/2018    Lab Results  Component Value Date   FRUCTOSAMINE 218 10/21/2018   FRUCTOSAMINE 312 (H) 03/06/2018   FRUCTOSAMINE 266 02/12/2018      Allergies as of 10/24/2018   No Known Allergies     Medication List       Accurate as of Oct 24, 2018  4:04 PM. Always use your most recent med list.        Accu-Chek FastClix Lancet Kit 1 each by Does not apply route daily. Use to check blood sugar 5 times daily.   Accu-Chek Guide Me w/Device Kit 1 each by Does not apply route daily.   acetaZOLAMIDE 250 MG tablet Commonly known as:  DIAMOX Take 1 tablet (250 mg total) by mouth 2 (two) times daily.   ALPRAZolam 1 MG tablet Commonly known as:  XANAX TAKE 1 TABLET BY MOUTH THREE TIMES A DAY AS NEEDED   amoxicillin-clavulanate 875-125 MG tablet Commonly known as:  AUGMENTIN Take 1 tablet by mouth 2 (two) times daily.   atenolol 100 MG tablet Commonly known as:  TENORMIN TAKE ONE TABLET BY MOUTH ONCE DAILY   buPROPion 150 MG 12 hr tablet Commonly known as:  WELLBUTRIN SR Take 1 tablet (150 mg total) by mouth daily.   cetirizine 10 MG tablet Commonly known as:  ZYRTEC Take 1 tablet (10 mg total) by mouth daily.   Exenatide ER 2 MG Pen Commonly known as:  Bydureon INJECT 2 MG INTO THE SKIN ONCE A WEEK.   fluticasone 50 MCG/ACT nasal spray Commonly known as:  FLONASE SPRAY 2 SPRAYS INTO EACH NOSTRIL EVERY DAY    glucose blood test strip 1 each by Other route daily. Use as instructed to check blood sugar before meals, 2 hours after meals, and before bed. DX:E11.65   glucose blood test strip 1 each by Other route as needed for other. Use as instructed to check blood sugar 4 times daily. DX:E11.65   Accu-Chek Guide  test strip Generic drug:  glucose blood USE AS INSTRUCTED TO TEST BLOOD SUGAR 5 TIMES DAILY.   Insulin Pen Needle 31G X 8 MM Misc Use with pen QD   insulin regular human CONCENTRATED 500 UNIT/ML injection Commonly known as:  HUMULIN R INJECT 15 UNITS SUBCUTANEOUSLY BEFORE BREAKFAST+DINNER+10 UNITS BEFORE LUNCH ADJUST AS DIRECTED.   Insulin Syringe/Needle U-500 31G X 0.5 ML Misc 1 each by Does not apply route daily. Use to inject insulin 3 times daily.   OmniPod Dash 5 Pack Misc 1 each by Does not apply route every 3 (three) days. APPLY ONE POD TO BODY EVERY 3 DAYS FOR INSULIN DELIVERY.   rosuvastatin 10 MG tablet Commonly known as:  Crestor Take 1 tablet (10 mg total) by mouth daily.   vortioxetine HBr 10 MG Tabs tablet Commonly known as:  TRINTELLIX Take 1 tablet (10 mg total) by mouth daily.       Allergies: No Known Allergies  Past Medical History:  Diagnosis Date  . Abnormal vaginal Pap smear    tx with cryotherapy  . Anxiety   . Depression   . Diabetes mellitus without complication (HCC)   . History of kidney stones    passed stone - no surgery  . Hypertension   . Migraine    otc med prn  . Obesity   . Pseudotumor cerebri   . SVD (spontaneous vaginal delivery)    x 3  . Tension headache     Past Surgical History:  Procedure Laterality Date  . ABDOMINAL HYSTERECTOMY    . ANTERIOR CERVICAL DECOMP/DISCECTOMY FUSION N/A 01/03/2016   Procedure: ANTERIOR CERVICAL DECOMPRESSION FUSION CERVICAL FIVE-SIX,CERVICAL SIX-SEVEN.;  Surgeon: Lisbeth Renshaw, MD;  Location: [facility];  Service: Neurosurgery;  Laterality: N/A;  right side approach  .  CRYOTHERAPY    . LAPAROSCOPIC ASSISTED VAGINAL HYSTERECTOMY Bilateral 04/21/2014   Procedure: LAPAROSCOPIC ASSISTED VAGINAL HYSTERECTOMY, BILATERAL SALPINGECTOMY;  Surgeon: Lavina Hamman, MD;  Location: [facility];  Service: Gynecology;  Laterality: Bilateral;  . MANDIBLE FRACTURE SURGERY  2002   x 1  . TUBAL LIGATION  11/2003  . WISDOM TOOTH EXTRACTION      Family History  Problem Relation Age of Onset  . Hypertension Mother   . Diabetes Mother   . Diabetes Sister   . Hypertension Sister     Social History:  reports that she has never smoked. She has never used smokeless tobacco. She reports current alcohol use of about 1.0 standard drinks of alcohol per week. She reports that she does not use drugs.   Review of Systems   Lipid history: Taking Crestor only for her hyperlipidemia  LDL is below 100 and triglycerides are now below 150, baseline for 54   Lab Results  Component Value Date   CHOL 154 10/21/2018   CHOL 168 05/20/2018   CHOL 188 01/09/2018   Lab Results  Component Value Date   HDL 33.90 (L) 10/21/2018   HDL 29.30 (L) 05/20/2018   HDL 29.90 (L) 01/09/2018   Lab Results  Component Value Date   LDLCALC 90 10/21/2018   LDLCALC 250 (H) 10/28/2015   LDLCALC 110 (H) 12/31/2012   Lab Results  Component Value Date   TRIG 149.0 10/21/2018   TRIG 308.0 (H) 05/20/2018   TRIG (H) 01/09/2018    454.0 Triglyceride is over 400; calculations on Lipids are invalid.   Lab Results  Component Value Date   CHOLHDL 5 10/21/2018   CHOLHDL 6 05/20/2018   CHOLHDL  6 01/09/2018   Lab Results  Component Value Date   LDLDIRECT 105.0 05/20/2018   LDLDIRECT 107.0 01/09/2018   LDLDIRECT 122 07/27/2016        Previous liver functions were high, now improved     Lab Results  Component Value Date   ALT 29 10/21/2018   Hypertension: This is being treated with atenolol 100 mg by her PCP  BP Readings from Last 3 Encounters:  08/27/18 118/76  08/25/18 112/64  05/21/18 118/78     Most recent foot exam:2/19  She is on Trintellix and bupropion for depression  LABS:  Lab on 10/21/2018  Component Date Value Ref Range Status  . Fructosamine 10/21/2018 218  0 - 285 umol/L Final   Comment: Published reference interval for apparently healthy subjects between age 16 and 31 is 2 - 285 umol/L and in a poorly controlled diabetic population is 228 - 563 umol/L with a mean of 396 umol/L.   Marland Kitchen Microalb, Ur 10/21/2018 1.9  0.0 - 1.9 mg/dL Final  . Creatinine,U 16/03/9603 168.6  mg/dL Final  . Microalb Creat Ratio 10/21/2018 1.1  0.0 - 30.0 mg/g Final  . Cholesterol 10/21/2018 154  0 - 200 mg/dL Final   ATP III Classification       Desirable:  < 200 mg/dL               Borderline High:  200 - 239 mg/dL          High:  > = 540 mg/dL  . Triglycerides 10/21/2018 149.0  0.0 - 149.0 mg/dL Final   Normal:  <981 mg/dLBorderline High:  150 - 199 mg/dL  . HDL 10/21/2018 33.90* >39.00 mg/dL Final  . VLDL 19/14/7829 29.8  0.0 - 40.0 mg/dL Final  . LDL Cholesterol 10/21/2018 90  0 - 99 mg/dL Final  . Total CHOL/HDL Ratio 10/21/2018 5   Final                  Men          Women1/2 Average Risk     3.4          3.3Average Risk          5.0          4.42X Average Risk          9.6          7.13X Average Risk          15.0          11.0                      . NonHDL 10/21/2018 120.05   Final   NOTE:  Non-HDL goal should be 30 mg/dL higher than patient's LDL goal (i.e. LDL goal of < 70 mg/dL, would have non-HDL goal of < 100 mg/dL)  . Sodium 10/21/2018 139  135 - 145 mEq/L Final  . Potassium 10/21/2018 3.9  3.5 - 5.1 mEq/L Final  . Chloride 10/21/2018 110  96 - 112 mEq/L Final  . CO2 10/21/2018 22  19 - 32 mEq/L Final  . Glucose, Bld 10/21/2018 102* 70 - 99 mg/dL Final  . BUN 56/21/3086 18  6 - 23 mg/dL Final  . Creatinine, Ser 10/21/2018 0.89  0.40 - 1.20 mg/dL Final  . Total Bilirubin 10/21/2018 0.6  0.2 - 1.2 mg/dL Final  . Alkaline Phosphatase 10/21/2018 86  39 - 117 U/L Final   . AST 10/21/2018 21  0 - 37 U/L Final  . ALT 10/21/2018 29  0 - 35 U/L Final  . Total Protein 10/21/2018 6.6  6.0 - 8.3 g/dL Final  . Albumin 62/37/6283 3.8  3.5 - 5.2 g/dL Final  . Calcium 15/17/6160 8.5  8.4 - 10.5 mg/dL Final  . GFR 73/71/0626 67.72  >60.00 mL/min Final    Physical Examination:  LMP 09/23/2013        ASSESSMENT:  Diabetes type 2, with morbid obesity     Her last A1c was 7.3  Fructosamine is excellent at 218  Currently on a regimen of insulin pump using U-500 insulin, Bydureon and metformin low-dose  She is not monitoring her blood sugars enough as discussed above Also not bolusing much to cover her meals Despite this and her fasting readings been mostly high her A1c and fructosamine are still excellent indicating benefit from the pump and the U-500 insulin Lowest blood sugars appear to be before dinnertime and she is concerned about mild hypoglycemia this week also  Probably doing better with diet while starting weight watchers program last month  She is not very motivated to check her sugars and is interested in the freestyle [place] system However explained to her that this is covered only if she is testing her blood sugars a minimum of 4 times a day   LIPIDS: Has had persistently high triglycerides previously from hyperglycemia and now below 150 with relatively better blood sugars and recently starting to improve diet Also LDL is below 100 now  Increased liver function likely to be from fatty liver in the past: ALT is now back to normal with better blood sugar control, may also be losing a little weight   PLAN:    She we will reduce her basal rate at 12 noon down to 0.9  With this she will have less tendency to low normal or low sugars before dinner  Explained to her that she needs to bolus at suppertime regardless of her blood sugar reading to cover her evening meal  Also if she is having more than 15 to 20 g of carbohydrate snacks during the  night she will take a bolus  More consistent glucose monitoring at various times as discussed  Continue Bydureon weekly and the new prescription was sent  Encourage her to walk and exercise regularly  Repeat A1c on the next visit Continue Crestor 10 mg daily  Total visit time for evaluation and management of above problems and counseling =25 minutes  There are no Patient Instructions on file for this visit.        Reather Littler 10/24/2018, 4:04 PM   Note: This office note was prepared with Dragon voice recognition system technology. Any transcriptional errors that result from this process are unintentional.

## 2018-10-30 DIAGNOSIS — E1165 Type 2 diabetes mellitus with hyperglycemia: Secondary | ICD-10-CM | POA: Diagnosis not present

## 2018-11-05 DIAGNOSIS — M5412 Radiculopathy, cervical region: Secondary | ICD-10-CM | POA: Diagnosis not present

## 2018-11-21 ENCOUNTER — Other Ambulatory Visit: Payer: Self-pay | Admitting: Family Medicine

## 2018-11-21 NOTE — Telephone Encounter (Signed)
Requested Prescriptions   Pending Prescriptions Disp Refills  . ALPRAZolam (XANAX) 1 MG tablet [Pharmacy Med Name: ALPRAZOLAM 1 MG TABLET] 90 tablet 2    Sig: TAKE 1 TABLET BY MOUTH THREE TIMES A DAY AS NEEDED   Last OV 08/25/2018 Last written 09/02/2018

## 2018-11-27 DIAGNOSIS — Z6841 Body Mass Index (BMI) 40.0 and over, adult: Secondary | ICD-10-CM | POA: Diagnosis not present

## 2018-11-27 DIAGNOSIS — M25561 Pain in right knee: Secondary | ICD-10-CM | POA: Diagnosis not present

## 2018-11-27 DIAGNOSIS — M1711 Unilateral primary osteoarthritis, right knee: Secondary | ICD-10-CM | POA: Diagnosis not present

## 2018-11-28 DIAGNOSIS — E1165 Type 2 diabetes mellitus with hyperglycemia: Secondary | ICD-10-CM | POA: Diagnosis not present

## 2018-12-01 IMAGING — US US ABDOMEN LIMITED
1 series · 14 of 25 positions shown · non-contrast
Comparison: 06/28/2006

CLINICAL DATA: Elevated LFTs

EXAM:
ULTRASOUND ABDOMEN LIMITED RIGHT UPPER QUADRANT

[Series 1: us abdomen limited · 0.25mm/px · 14 of 38 slices shown]
[im 1/38]
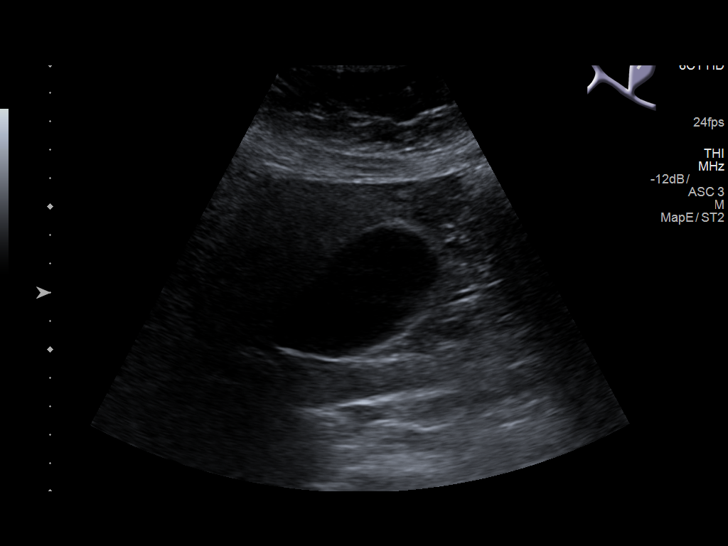
[im 4/38]
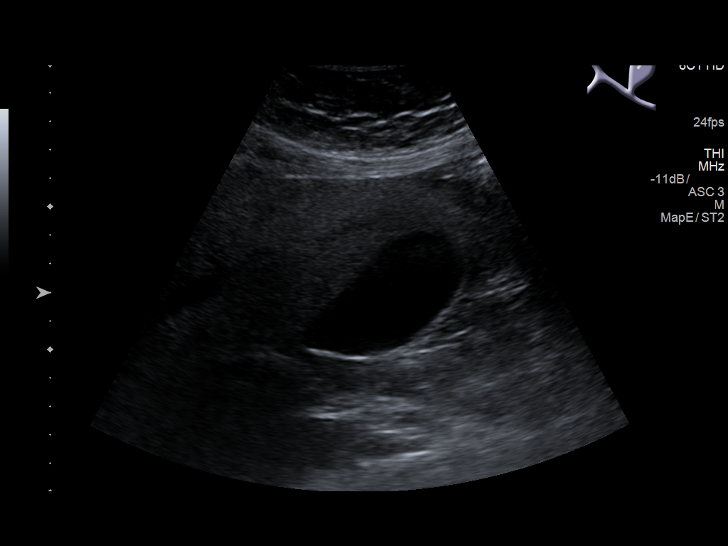
[im 7/38]
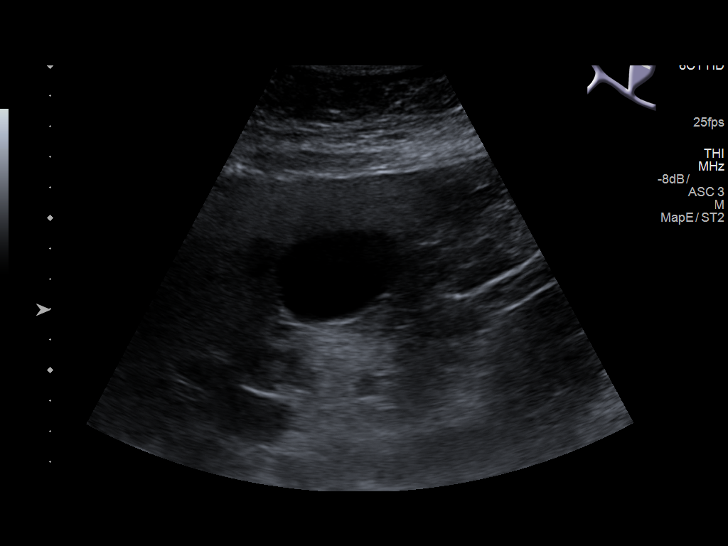
[im 10/38]
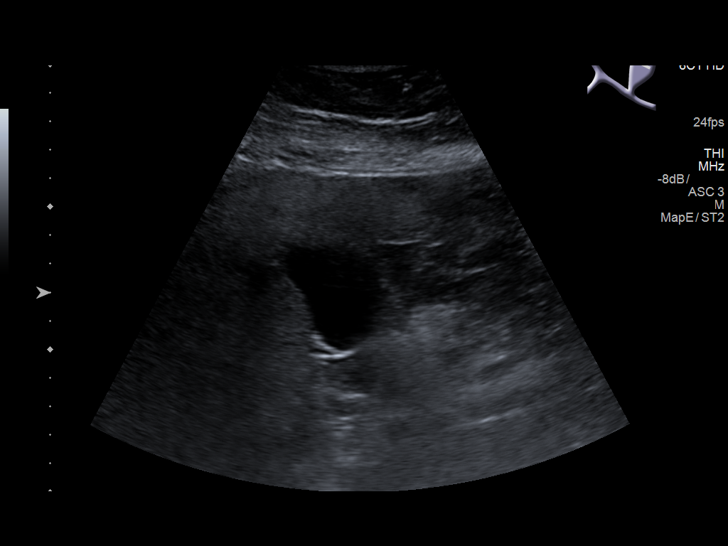
[im 13/38]
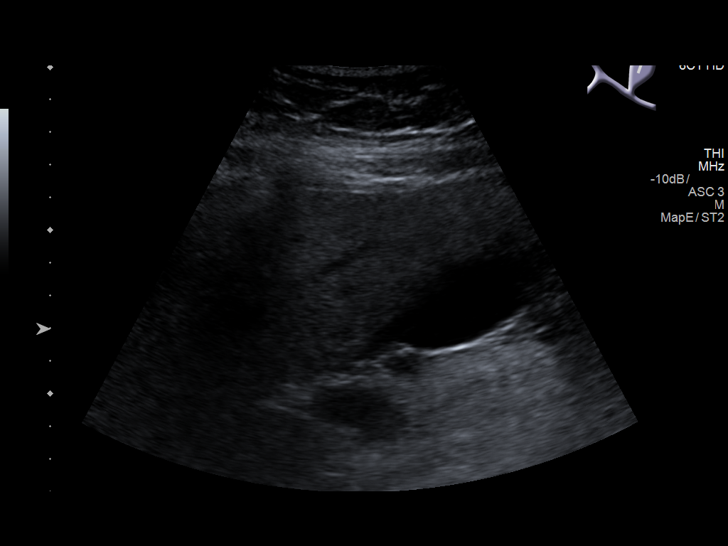
[im 14/38]
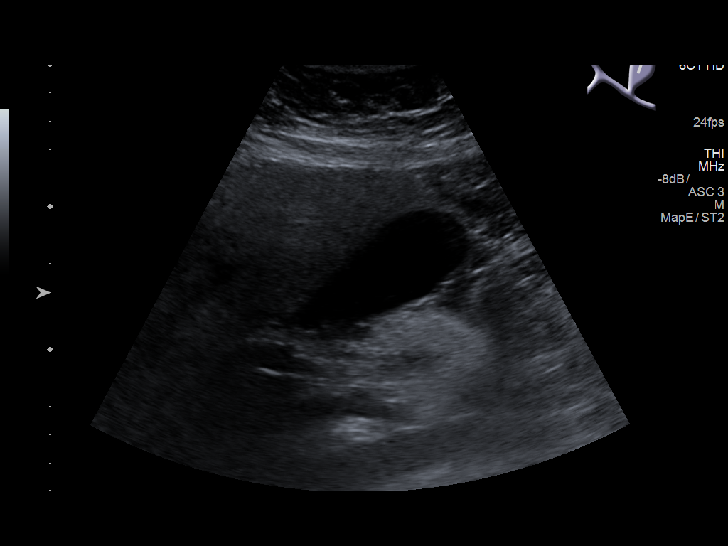
[im 17/38]
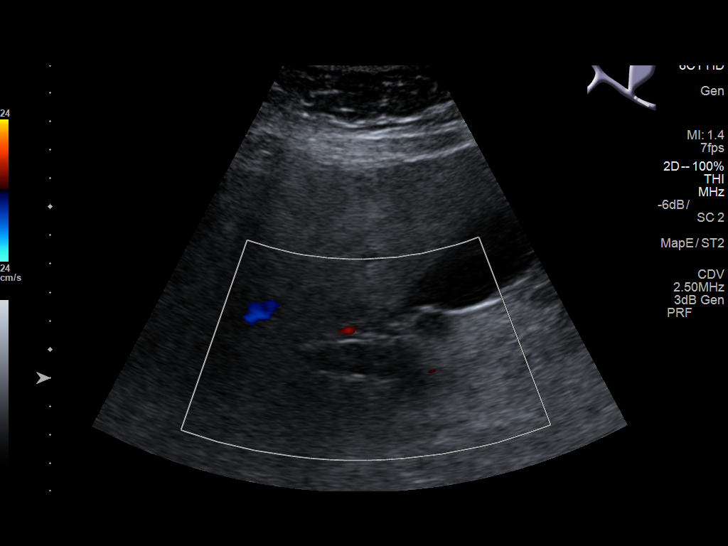
[im 21/38]
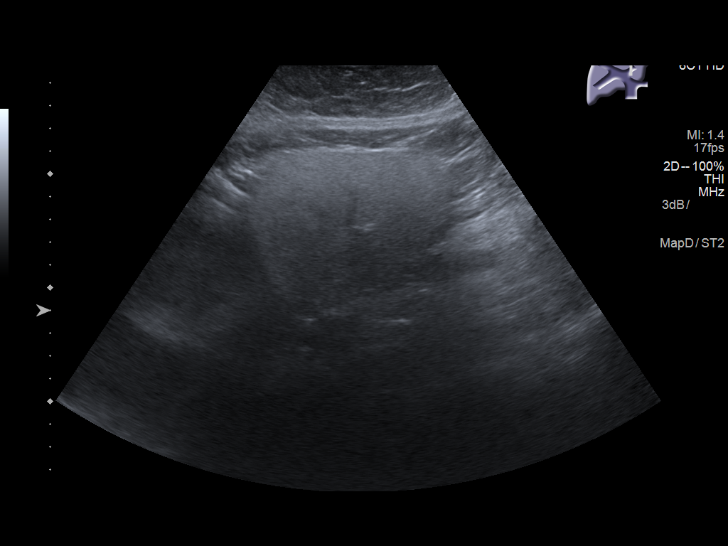
[im 24/38]
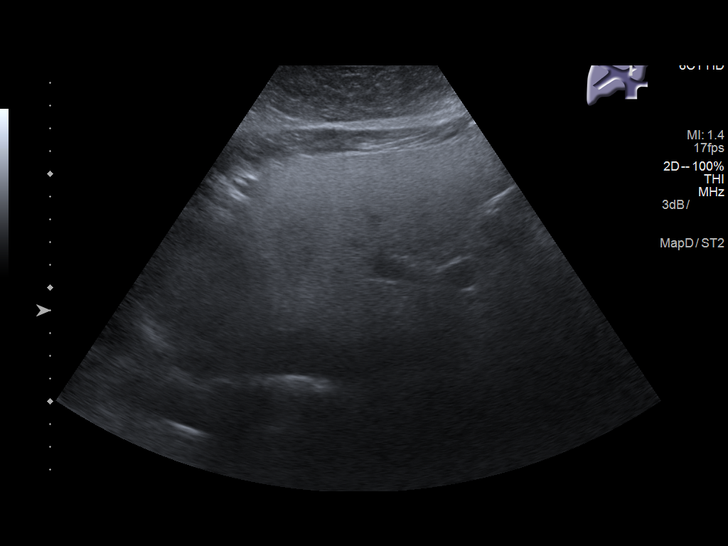
[im 25/38]
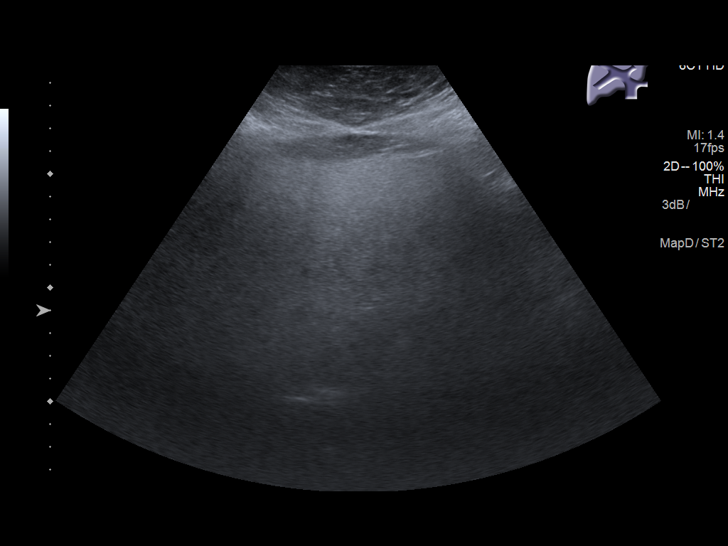
[im 28/38]
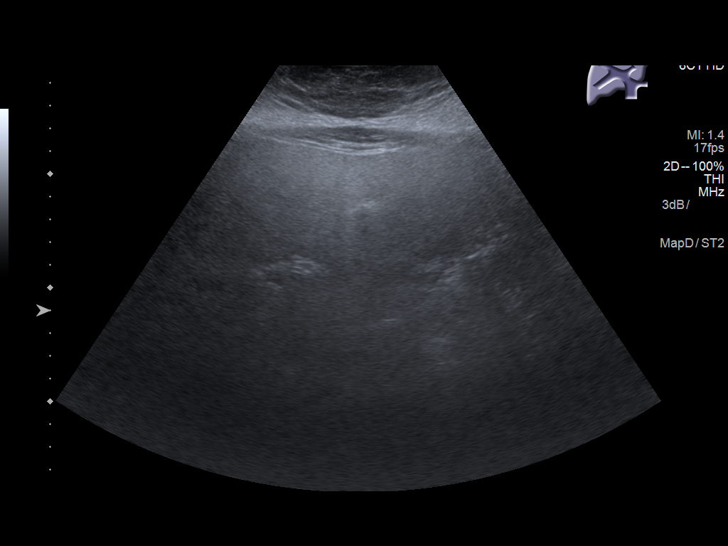
[im 31/38]
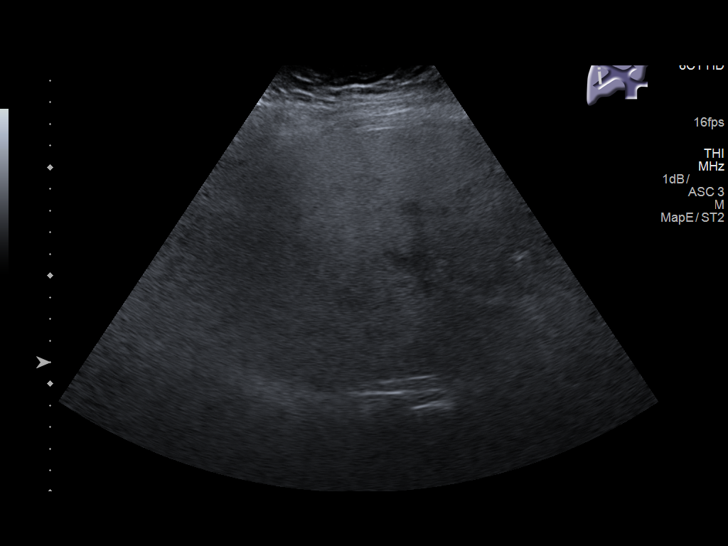
[im 34/38]
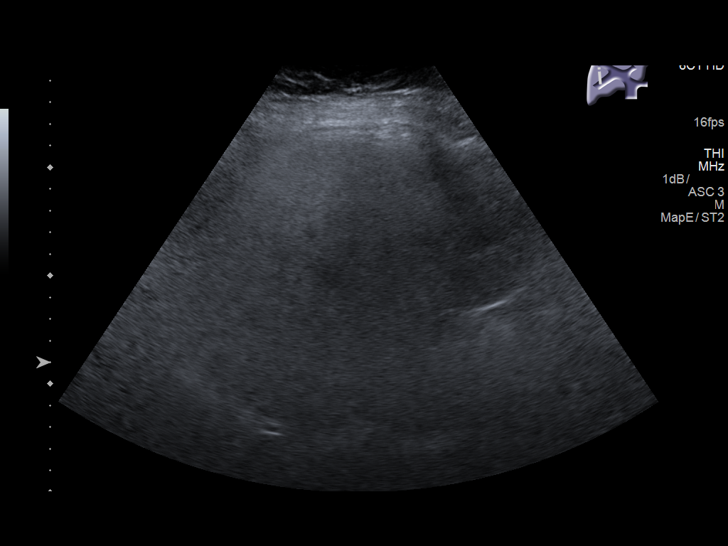
[im 38/38]
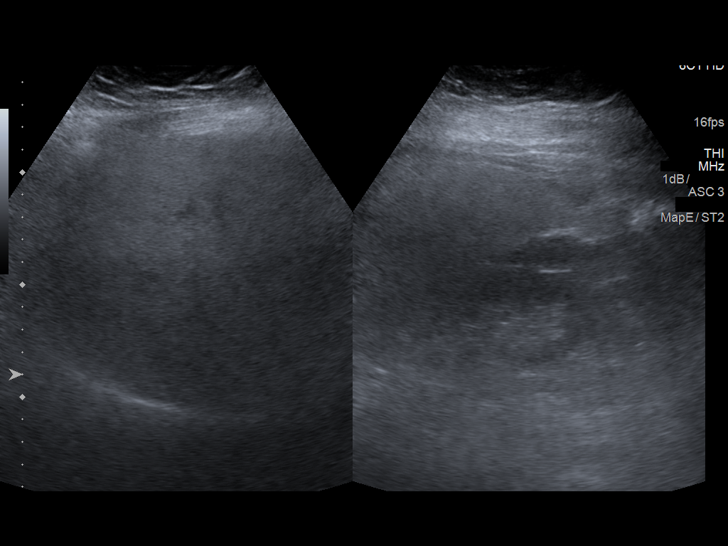

[14 of 25 positions shown; findings below may reference images not displayed]

FINDINGS: Gallbladder:

No gallstones or wall thickening visualized. No sonographic Murphy
sign noted by sonographer.

Common bile duct:

Diameter: 3 mm

Liver:

Heterogeneous increased echogenicity compatible with hepatic
steatosis or fatty infiltration. This limits evaluation for a focal
hepatic abnormality. No intrahepatic biliary dilatation. No upper
abdominal free fluid or ascites.

Included views of the right kidney demonstrate no hydronephrosis.
IMPRESSION: Hepatic steatosis.

## 2018-12-17 DIAGNOSIS — Z01419 Encounter for gynecological examination (general) (routine) without abnormal findings: Secondary | ICD-10-CM | POA: Diagnosis not present

## 2018-12-17 DIAGNOSIS — Z6841 Body Mass Index (BMI) 40.0 and over, adult: Secondary | ICD-10-CM | POA: Diagnosis not present

## 2018-12-17 DIAGNOSIS — Z1389 Encounter for screening for other disorder: Secondary | ICD-10-CM | POA: Diagnosis not present

## 2018-12-17 DIAGNOSIS — Z1231 Encounter for screening mammogram for malignant neoplasm of breast: Secondary | ICD-10-CM | POA: Diagnosis not present

## 2018-12-17 DIAGNOSIS — Z130 Encounter for screening for diseases of the blood and blood-forming organs and certain disorders involving the immune mechanism: Secondary | ICD-10-CM | POA: Diagnosis not present

## 2018-12-17 DIAGNOSIS — N393 Stress incontinence (female) (male): Secondary | ICD-10-CM | POA: Diagnosis not present

## 2018-12-23 DIAGNOSIS — M5412 Radiculopathy, cervical region: Secondary | ICD-10-CM | POA: Diagnosis not present

## 2018-12-24 DIAGNOSIS — M5412 Radiculopathy, cervical region: Secondary | ICD-10-CM | POA: Diagnosis not present

## 2018-12-24 DIAGNOSIS — M50221 Other cervical disc displacement at C4-C5 level: Secondary | ICD-10-CM | POA: Diagnosis not present

## 2018-12-28 DIAGNOSIS — E1165 Type 2 diabetes mellitus with hyperglycemia: Secondary | ICD-10-CM | POA: Diagnosis not present

## 2019-01-13 DIAGNOSIS — M25561 Pain in right knee: Secondary | ICD-10-CM | POA: Diagnosis not present

## 2019-01-13 DIAGNOSIS — Z6841 Body Mass Index (BMI) 40.0 and over, adult: Secondary | ICD-10-CM | POA: Diagnosis not present

## 2019-01-14 ENCOUNTER — Other Ambulatory Visit: Payer: Self-pay | Admitting: Endocrinology

## 2019-01-15 DIAGNOSIS — M549 Dorsalgia, unspecified: Secondary | ICD-10-CM | POA: Diagnosis not present

## 2019-01-15 DIAGNOSIS — M545 Low back pain: Secondary | ICD-10-CM | POA: Diagnosis not present

## 2019-01-15 DIAGNOSIS — M546 Pain in thoracic spine: Secondary | ICD-10-CM | POA: Diagnosis not present

## 2019-01-18 ENCOUNTER — Other Ambulatory Visit: Payer: Self-pay | Admitting: Family Medicine

## 2019-01-21 DIAGNOSIS — M25561 Pain in right knee: Secondary | ICD-10-CM | POA: Diagnosis not present

## 2019-01-26 DIAGNOSIS — G8929 Other chronic pain: Secondary | ICD-10-CM | POA: Diagnosis not present

## 2019-01-26 DIAGNOSIS — M545 Low back pain: Secondary | ICD-10-CM | POA: Diagnosis not present

## 2019-01-26 DIAGNOSIS — M546 Pain in thoracic spine: Secondary | ICD-10-CM | POA: Diagnosis not present

## 2019-01-28 ENCOUNTER — Other Ambulatory Visit: Payer: Medicaid Other

## 2019-01-28 DIAGNOSIS — E1165 Type 2 diabetes mellitus with hyperglycemia: Secondary | ICD-10-CM | POA: Diagnosis not present

## 2019-01-30 ENCOUNTER — Ambulatory Visit: Payer: Medicaid Other | Admitting: Endocrinology

## 2019-01-30 DIAGNOSIS — M25561 Pain in right knee: Secondary | ICD-10-CM | POA: Diagnosis not present

## 2019-01-30 DIAGNOSIS — M1711 Unilateral primary osteoarthritis, right knee: Secondary | ICD-10-CM | POA: Diagnosis not present

## 2019-02-08 ENCOUNTER — Other Ambulatory Visit: Payer: Self-pay | Admitting: Family Medicine

## 2019-02-12 ENCOUNTER — Other Ambulatory Visit: Payer: Self-pay | Admitting: Family Medicine

## 2019-02-12 NOTE — Telephone Encounter (Signed)
Ok to refill??  Last office visit 08/25/2018.  Last refill 11/21/2018, #2 refills.

## 2019-02-16 ENCOUNTER — Other Ambulatory Visit: Payer: Self-pay | Admitting: Family Medicine

## 2019-02-17 ENCOUNTER — Ambulatory Visit: Payer: Medicaid Other | Admitting: Endocrinology

## 2019-02-18 ENCOUNTER — Encounter: Payer: Self-pay | Admitting: Endocrinology

## 2019-02-18 ENCOUNTER — Ambulatory Visit (INDEPENDENT_AMBULATORY_CARE_PROVIDER_SITE_OTHER): Payer: Medicaid Other | Admitting: Endocrinology

## 2019-02-18 ENCOUNTER — Other Ambulatory Visit: Payer: Self-pay | Admitting: Family Medicine

## 2019-02-18 ENCOUNTER — Other Ambulatory Visit: Payer: Self-pay

## 2019-02-18 VITALS — BP 128/82 | HR 99 | Ht 61.0 in | Wt 285.6 lb

## 2019-02-18 DIAGNOSIS — Z794 Long term (current) use of insulin: Secondary | ICD-10-CM

## 2019-02-18 DIAGNOSIS — E1165 Type 2 diabetes mellitus with hyperglycemia: Secondary | ICD-10-CM

## 2019-02-18 DIAGNOSIS — I10 Essential (primary) hypertension: Secondary | ICD-10-CM | POA: Diagnosis not present

## 2019-02-18 LAB — POCT GLYCOSYLATED HEMOGLOBIN (HGB A1C): Hemoglobin A1C: 7.2 % — AB (ref 4.0–5.6)

## 2019-02-18 NOTE — Progress Notes (Signed)
Patient ID: Kimberly [name], female   DOB: [birth year], 48 y.o.   MRN: [id number]          Reason for Appointment: Follow-up for Type 2 Diabetes   Today's office visit was provided via telemedicine using video technique Explained to the patient and the the limitations of evaluation and management by telemedicine and the availability of in person appointments.  The patient understood the limitations and agreed to proceed. Patient also understood that the telehealth visit is billable. . Location of the patient: Home . Location of the provider: Office Only the patient and myself were participating in the encounter  Patient's glucose meter and insulin pump were downloaded and these were reviewed in detail prior to her video session Relevant sections of her medical record and labs were also reviewed prior to contact  History of Present Illness:          Date of diagnosis of type 2 diabetes mellitus: 12/2014   Background history:   She was initially tried on metformin but because of side effects as was stopped and she was tried on glipizide and subsequently [place].  She apparently took Jardiance for at least a couple of months but because of recurrent yeast infection she stopped it and she did not think it was helping her glucose also He was started on Lantus insulin in 12/2015 Subsequently Bydureon was added in 03/2016  Recent history:   INSULIN regimen with Omni pod/Dash::  Basal rate:  MN: 0.8, 5 AM = 1.3, 12 PM = 0.9, 5 PM = 1.0.  Total basal insulin 25.6 units I/C ratio 10, target 140 with corrections over 120, ISF: 60, and timing: 6 hours. Boluses 8-12 units with meals   Non-insulin hypoglycemic drugs the patient is taking are: Bydureon 2 mg weekly  Most recent A1c is 7.2, previously as high as 9.8   Current management, blood sugar patterns and problems identified:  She has not been using her insulin pump consistently recently partly because of having some difficulties with  the pods  However also at times is not changing the pods are starting back on the pump as directed  She has very few boluses even when her pump appears to be active  She now says that she is not bolusing when her blood sugars are near normal for fear of hypoglycemia  Also frequently blood sugar may be low normal before meals and she will not bolus  She is still not understanding that her blood sugars are going up every time she is eating and today was having toast and juice her blood sugar was up about 100 mg without a bolus  Most of her high sugars are when she is not using her pump or bolusing for her meals  As before is frequently not entering her blood sugars in the pump while bolusing  FASTING blood sugars are recently near normal and frequently below 100  Also has a few low sugars documented before lunch but not at dinnertime  She says that around 5 PM or so she can feel hypoglycemic and will consume some carbohydrates but not check her sugar at that time  Not clear if she has consistently high postprandial readings  She is concerned about difficulty losing weight even with recently trying to exercise and continuing Bydureon  She thinks she is taking Bydureon every week as directed  Exercise: Trying to swim up to 40 minutes, 4/7 days a week        Side effects from  medications : Nausea and diarrhea from any dose metformin, recurrent yeast infections from Jardiance  Glucose monitoring:  About 1-2 times a day         Glucometer:  Accu-Chek mostly .      Blood Glucose readings   PRE-MEAL Fasting Lunch Dinner Bedtime Overall  Glucose range:  69- 283  55-276  81-226  89-417   Mean/median:  114   136   152   Previous readings:   PRE-MEAL Fasting Lunch Dinner Bedtime Overall  Glucose range:  166-277  96-229  77  170   Mean/median:     ?   POST-MEAL PC Breakfast PC Lunch PC Dinner  Glucose range:   79, 152   Mean/median:         Self-care:  Typical meal intake:  Breakfast is   bagel, scrambled egg/oatmeal, juice.  Sometimes only toast and juice   Lunch may be sandwich or leftovers Dinner is variable, Snacks will be yogurt or chips            Dietician visit, most recent: 08/2017              Weight history:  Wt Readings from Last 3 Encounters:  02/18/19 285 lb 9.6 oz (129.5 kg)  08/27/18 279 lb 6.4 oz (126.7 kg)  08/25/18 274 lb (124.3 kg)    `Glycemic control:   Lab Results  Component Value Date   HGBA1C 7.2 (A) 02/18/2019   HGBA1C 7.3 (A) 08/27/2018   HGBA1C 9.8 (H) 05/20/2018   Lab Results  Component Value Date   MICROALBUR 1.9 10/21/2018   LDLCALC 90 10/21/2018   CREATININE 0.89 10/21/2018   Lab Results  Component Value Date   MICRALBCREAT 1.1 10/21/2018    Lab Results  Component Value Date   FRUCTOSAMINE 218 10/21/2018   FRUCTOSAMINE 312 (H) 03/06/2018   FRUCTOSAMINE 266 02/12/2018      Allergies as of 02/18/2019   No Known Allergies     Medication List       Accurate as of February 18, 2019  3:08 PM. If you have any questions, ask your nurse or doctor.        Accu-Chek [employer] Kit 1 each by Does not apply route daily. Use to check blood sugar 5 times daily.   Accu-Chek Guide Me w/Device Kit 1 each by Does not apply route daily.   acetaZOLAMIDE 250 MG tablet Commonly known as: DIAMOX TAKE 1 TABLET BY MOUTH TWICE A DAY   ALPRAZolam 1 MG tablet Commonly known as: XANAX TAKE 1 TABLET BY MOUTH THREE TIMES A DAY AS NEEDED   amoxicillin-clavulanate 875-125 MG tablet Commonly known as: AUGMENTIN Take 1 tablet by mouth 2 (two) times daily.   atenolol 100 MG tablet Commonly known as: TENORMIN TAKE ONE TABLET BY MOUTH ONCE DAILY   buPROPion 150 MG 12 hr tablet Commonly known as: WELLBUTRIN SR TAKE 1 TABLET BY MOUTH EVERY DAY   Bydureon 2 MG Pen Generic drug: Exenatide ER INJECT 2 MG INTO THE SKIN ONCE A WEEK.   cetirizine 10 MG tablet Commonly known as: ZYRTEC Take 1 tablet (10 mg total)  by mouth daily.   fluticasone 50 MCG/ACT nasal spray Commonly known as: FLONASE SPRAY 2 SPRAYS INTO EACH NOSTRIL EVERY DAY   glucose blood test strip 1 each by Other route daily. Use as instructed to check blood sugar before meals, 2 hours after meals, and before bed. DX:E11.65   glucose blood test strip 1 each by  Other route as needed for other. Use as instructed to check blood sugar 4 times daily. DX:E11.65   Accu-Chek Guide test strip Generic drug: glucose blood USE AS INSTRUCTED TO TEST BLOOD SUGAR 5 TIMES DAILY.   Insulin Pen Needle 31G X 8 MM Misc Use with pen QD   insulin regular human CONCENTRATED 500 UNIT/ML injection Commonly known as: HUMULIN R INJECT 15 UNITS SUBCUTANEOUSLY BEFORE BREAKFAST+DINNER+10 UNITS BEFORE LUNCH ADJUST AS DIRECTED.   Insulin Syringe/Needle U-500 31G X 0.5 ML Misc 1 each by Does not apply route daily. Use to inject insulin 3 times daily.   OmniPod Dash 5 Pack Pods Misc 1 each by Does not apply route every 3 (three) days. APPLY ONE POD TO BODY EVERY 3 DAYS FOR INSULIN DELIVERY.   rosuvastatin 10 MG tablet Commonly known as: Crestor Take 1 tablet (10 mg total) by mouth daily.   vortioxetine HBr 10 MG Tabs tablet Commonly known as: TRINTELLIX Take 1 tablet (10 mg total) by mouth daily.       Allergies: No Known Allergies  Past Medical History:  Diagnosis Date  . Abnormal vaginal Pap smear    tx with cryotherapy  . Anxiety   . Depression   . Diabetes mellitus without complication (HCC)   . History of kidney stones    passed stone - no surgery  . Hypertension   . Migraine    otc med prn  . Obesity   . Pseudotumor cerebri   . SVD (spontaneous vaginal delivery)    x 3  . Tension headache     Past Surgical History:  Procedure Laterality Date  . ABDOMINAL HYSTERECTOMY    . ANTERIOR CERVICAL DECOMP/DISCECTOMY FUSION N/A 01/03/2016   Procedure: ANTERIOR CERVICAL DECOMPRESSION FUSION CERVICAL FIVE-SIX,CERVICAL SIX-SEVEN.;   Surgeon: Lisbeth Renshaw, MD;  Location: [facility];  Service: Neurosurgery;  Laterality: N/A;  right side approach  . CRYOTHERAPY    . LAPAROSCOPIC ASSISTED VAGINAL HYSTERECTOMY Bilateral 04/21/2014   Procedure: LAPAROSCOPIC ASSISTED VAGINAL HYSTERECTOMY, BILATERAL SALPINGECTOMY;  Surgeon: Lavina Hamman, MD;  Location: [facility];  Service: Gynecology;  Laterality: Bilateral;  . MANDIBLE FRACTURE SURGERY  2002   x 1  . TUBAL LIGATION  11/2003  . WISDOM TOOTH EXTRACTION      Family History  Problem Relation Age of Onset  . Hypertension Mother   . Diabetes Mother   . Diabetes Sister   . Hypertension Sister     Social History:  reports that she has never smoked. She has never used smokeless tobacco. She reports current alcohol use of about 1.0 standard drinks of alcohol per week. She reports that she does not use drugs.   Review of Systems  Psychiatric/Behavioral: Positive for insomnia.     Lipid history: Taking Crestor only for her hyperlipidemia  LDL is below 100 and triglycerides are now below 150, baseline for 54   Lab Results  Component Value Date   CHOL 154 10/21/2018   CHOL 168 05/20/2018   CHOL 188 01/09/2018   Lab Results  Component Value Date   HDL 33.90 (L) 10/21/2018   HDL 29.30 (L) 05/20/2018   HDL 29.90 (L) 01/09/2018   Lab Results  Component Value Date   LDLCALC 90 10/21/2018   LDLCALC 250 (H) 10/28/2015   LDLCALC 110 (H) 12/31/2012   Lab Results  Component Value Date   TRIG 149.0 10/21/2018   TRIG 308.0 (H) 05/20/2018   TRIG (H) 01/09/2018    454.0 Triglyceride is over 400; calculations  on Lipids are invalid.   Lab Results  Component Value Date   CHOLHDL 5 10/21/2018   CHOLHDL 6 05/20/2018   CHOLHDL 6 01/09/2018   Lab Results  Component Value Date   LDLDIRECT 105.0 05/20/2018   LDLDIRECT 107.0 01/09/2018   LDLDIRECT 122 07/27/2016        Previous liver functions were high, now improved     Lab Results  Component Value Date   ALT 29  10/21/2018   Hypertension: This is being treated with atenolol 100 mg by her PCP and has not had a follow-up lately She has had more anxiety and blood pressure was high initially  BP Readings from Last 3 Encounters:  02/18/19 128/82  08/27/18 118/76  08/25/18 112/64    Most recent foot exam:2/19  She is on Trintellix and bupropion for depression However she thinks she is more anxious now and has not picked up her prescription for Xanax from PCP  LABS:  Office Visit on 02/18/2019  Component Date Value Ref Range Status  . Hemoglobin A1C 02/18/2019 7.2* 4.0 - 5.6 % Final    Physical Examination:  BP 128/82 (Cuff Size: Large)   Pulse 99   Ht 5\' 1"  (1.549 m)   Wt 285 lb 9.6 oz (129.5 kg)   LMP 09/23/2013   SpO2 98%   BMI 53.96 kg/m        ASSESSMENT:  Diabetes type 2, with morbid obesity     Her A1c is 7.2 and has been fairly good with the pump  Currently on a regimen of insulin pump using U-500 insulin, Bydureon weekly  She is not using her pump consistently and seems to be having more technical difficulties However even with the regular pump use and boluses her blood sugar recently at home is averaging around 150 She appears to be having low normal readings before meals with some documented low sugars before lunch and symptoms of low sugars before dinner at home  Currently she is not bolusing as directed and discussed in detail the need to bolus all the time for her carbohydrate intake at every meal regardless of what blood sugar is normal or high She can treat her low blood sugars and then bolus by putting her blood sugar reading in the pump Also needs to do correction boluses for all high readings  She will need lower basal rates to reduce tendency to low sugars before meals and these will be changed as follows Midnight = 0.70, 5 AM = 1.30, 12 PM = 0.8 and 5 PM = 1.0 She can reduce her bolus amount if eating smaller meals or less carbohydrate  She will discuss  any issues with her pump with the manufacturer Also given that the advantages of using the new controller which will help her upload her data automatically  Hypertension: Blood pressure is mildly increased but she is having more anxiety Also consider ACE inhibitor                                 PLAN:   As above  Follow-up in 3 months  Counseling time on subjects discussed in assessment and plan sections is over 50% of today's 25 minute visit  There are no Patient Instructions on file for this visit.        Reather Littler 02/18/2019, 3:08 PM   Note: This office note was prepared with Dragon voice recognition system technology. Any transcriptional  errors that result from this process are unintentional.

## 2019-02-26 DIAGNOSIS — E1165 Type 2 diabetes mellitus with hyperglycemia: Secondary | ICD-10-CM | POA: Diagnosis not present

## 2019-03-10 ENCOUNTER — Other Ambulatory Visit: Payer: Self-pay | Admitting: Family Medicine

## 2019-03-17 ENCOUNTER — Other Ambulatory Visit: Payer: Self-pay | Admitting: Endocrinology

## 2019-03-28 DIAGNOSIS — E1165 Type 2 diabetes mellitus with hyperglycemia: Secondary | ICD-10-CM | POA: Diagnosis not present

## 2019-04-02 DIAGNOSIS — Z20828 Contact with and (suspected) exposure to other viral communicable diseases: Secondary | ICD-10-CM | POA: Diagnosis not present

## 2019-04-22 ENCOUNTER — Telehealth: Payer: Self-pay

## 2019-04-22 NOTE — Telephone Encounter (Signed)
Patient called in stating her sugars have been in the hugh 400's for almost 2 days and given herself double the dosages still in the mid 300's high 200's. Patient doesn't feel well and thinks she's dehydrated    Please call and advise

## 2019-04-22 NOTE — Telephone Encounter (Signed)
How would you prefer this be handled?

## 2019-04-22 NOTE — Telephone Encounter (Signed)
Discussed her management with the patient.  She is having some dry mouth and headache but no other symptoms.  She says she is having a lot of stress and insomnia.  Blood sugars in the 260 range today with taking additional 3 or 4 units of boluses.  She has already changed her pod Advised her to increase her fluid intake She will take as much as 20 units of boluses for blood sugars over 200 Also temporarily increase her basal rate by 50% for up to 12 hours She will discuss treatment of her anxiety and insomnia with PCP

## 2019-04-28 DIAGNOSIS — Z6841 Body Mass Index (BMI) 40.0 and over, adult: Secondary | ICD-10-CM | POA: Diagnosis not present

## 2019-04-28 DIAGNOSIS — E1165 Type 2 diabetes mellitus with hyperglycemia: Secondary | ICD-10-CM | POA: Diagnosis not present

## 2019-04-28 DIAGNOSIS — M1711 Unilateral primary osteoarthritis, right knee: Secondary | ICD-10-CM | POA: Diagnosis not present

## 2019-04-28 DIAGNOSIS — M25561 Pain in right knee: Secondary | ICD-10-CM | POA: Diagnosis not present

## 2019-05-12 ENCOUNTER — Encounter: Payer: Self-pay | Admitting: Family Medicine

## 2019-05-12 ENCOUNTER — Other Ambulatory Visit: Payer: Self-pay | Admitting: Family Medicine

## 2019-05-12 NOTE — Telephone Encounter (Signed)
This encounter was created in error - please disregard.

## 2019-05-12 NOTE — Telephone Encounter (Signed)
Ok to refill??  Last office visit 08/25/2018.  Last refill 02/12/2019, #2 refills.

## 2019-05-26 ENCOUNTER — Other Ambulatory Visit: Payer: Medicaid Other

## 2019-05-28 DIAGNOSIS — E1165 Type 2 diabetes mellitus with hyperglycemia: Secondary | ICD-10-CM | POA: Diagnosis not present

## 2019-05-29 ENCOUNTER — Ambulatory Visit: Payer: Medicaid Other | Admitting: Endocrinology

## 2019-06-02 DIAGNOSIS — Z6841 Body Mass Index (BMI) 40.0 and over, adult: Secondary | ICD-10-CM | POA: Diagnosis not present

## 2019-06-02 DIAGNOSIS — M25561 Pain in right knee: Secondary | ICD-10-CM | POA: Diagnosis not present

## 2019-06-02 DIAGNOSIS — M1711 Unilateral primary osteoarthritis, right knee: Secondary | ICD-10-CM | POA: Diagnosis not present

## 2019-06-10 DIAGNOSIS — F5081 Binge eating disorder: Secondary | ICD-10-CM | POA: Diagnosis not present

## 2019-06-10 DIAGNOSIS — Z79891 Long term (current) use of opiate analgesic: Secondary | ICD-10-CM | POA: Diagnosis not present

## 2019-06-10 DIAGNOSIS — F331 Major depressive disorder, recurrent, moderate: Secondary | ICD-10-CM | POA: Diagnosis not present

## 2019-06-10 DIAGNOSIS — F411 Generalized anxiety disorder: Secondary | ICD-10-CM | POA: Diagnosis not present

## 2019-06-15 ENCOUNTER — Other Ambulatory Visit: Payer: Self-pay

## 2019-06-15 ENCOUNTER — Other Ambulatory Visit (INDEPENDENT_AMBULATORY_CARE_PROVIDER_SITE_OTHER): Payer: Medicaid Other

## 2019-06-15 DIAGNOSIS — E1165 Type 2 diabetes mellitus with hyperglycemia: Secondary | ICD-10-CM | POA: Diagnosis not present

## 2019-06-15 DIAGNOSIS — Z794 Long term (current) use of insulin: Secondary | ICD-10-CM | POA: Diagnosis not present

## 2019-06-15 LAB — COMPREHENSIVE METABOLIC PANEL
ALT: 92 U/L — ABNORMAL HIGH (ref 0–35)
AST: 54 U/L — ABNORMAL HIGH (ref 0–37)
Albumin: 4.1 g/dL (ref 3.5–5.2)
Alkaline Phosphatase: 111 U/L (ref 39–117)
BUN: 12 mg/dL (ref 6–23)
CO2: 23 mEq/L (ref 19–32)
Calcium: 9.2 mg/dL (ref 8.4–10.5)
Chloride: 104 mEq/L (ref 96–112)
Creatinine, Ser: 0.96 mg/dL (ref 0.40–1.20)
GFR: 61.88 mL/min (ref >60.00)
Glucose, Bld: 290 mg/dL — ABNORMAL HIGH (ref 70–99)
Potassium: 3.8 mEq/L (ref 3.5–5.1)
Sodium: 137 mEq/L (ref 135–145)
Total Bilirubin: 0.6 mg/dL (ref 0.2–1.2)
Total Protein: 6.9 g/dL (ref 6.0–8.3)

## 2019-06-15 LAB — HEMOGLOBIN A1C: Hgb A1c MFr Bld: 7.7 % — ABNORMAL HIGH (ref 4.6–6.5)

## 2019-06-23 ENCOUNTER — Other Ambulatory Visit: Payer: Self-pay

## 2019-06-25 ENCOUNTER — Encounter: Payer: Self-pay | Admitting: Endocrinology

## 2019-06-25 ENCOUNTER — Ambulatory Visit (INDEPENDENT_AMBULATORY_CARE_PROVIDER_SITE_OTHER): Payer: Medicaid Other | Admitting: Endocrinology

## 2019-06-25 ENCOUNTER — Other Ambulatory Visit: Payer: Self-pay

## 2019-06-25 VITALS — BP 124/86 | HR 86 | Ht 61.0 in | Wt 293.8 lb

## 2019-06-25 DIAGNOSIS — Z794 Long term (current) use of insulin: Secondary | ICD-10-CM | POA: Diagnosis not present

## 2019-06-25 DIAGNOSIS — E1165 Type 2 diabetes mellitus with hyperglycemia: Secondary | ICD-10-CM

## 2019-06-25 DIAGNOSIS — I10 Essential (primary) hypertension: Secondary | ICD-10-CM

## 2019-06-25 DIAGNOSIS — E782 Mixed hyperlipidemia: Secondary | ICD-10-CM | POA: Diagnosis not present

## 2019-06-25 LAB — GLUCOSE, POCT (MANUAL RESULT ENTRY): POC Glucose: 154 mg/dl — AB (ref 70–99)

## 2019-06-25 MED ORDER — OZEMPIC (0.25 OR 0.5 MG/DOSE) 2 MG/1.5ML ~~LOC~~ SOPN: 0.5000 mg | PEN_INJECTOR | SUBCUTANEOUS | 2 refills | Status: DC

## 2019-06-25 NOTE — Patient Instructions (Signed)
Bolus for ALL Carbs even if sugar not done

## 2019-06-25 NOTE — Progress Notes (Signed)
Patient ID: Kimberly [name], female   DOB: 1970-08-22, 48 y.o.   MRN: [id number]          Reason for Appointment: Follow-up for Type 2 Diabetes   History of Present Illness:          Date of diagnosis of type 2 diabetes mellitus: 12/2014   Background history:   She was initially tried on metformin but because of side effects as was stopped and she was tried on glipizide and subsequently [place].  She apparently took Jardiance for at least a couple of months but because of recurrent yeast infection she stopped it and she did not think it was helping her glucose also He was started on Lantus insulin in 12/2015 Subsequently Bydureon was added in 03/2016  Recent history:   INSULIN regimen with Omni pod/Dash::  Basal rate:  MN: 0.8, 5 AM = 1.3, 12 PM = 0.9, 5 PM = 1.0.  Total basal insulin 25.6 units I/C ratio 10, target 140 with corrections over 120, ISF: 60, and timing: 6 hours. Boluses 8-12 units with meals   Non-insulin hypoglycemic drugs the patient is taking are: Bydureon 2 mg weekly  Most recent A1c is 7.7, previously was 7.2, previously as high as 9.8   Current management, blood sugar patterns and problems identified:  She has overall higher readings  She thinks that because of stress and various issues she is not paying attention to her dietary  Also not planning meals and frequently eating fast food  She says she cannot check her sugar while at work and will not take a bolus also when eating  However even though she is at home at suppertime she is missing about half the boluses at that time  Frequently also not bolusing in the morning  Has a few days where she has no boluses at all; she still thinks that she is usually eating 3 meals a day  Occasionally will also bolus after eating when the sugar goes up  Even though she has been told to bolus empirically based on meal size without carbohydrate counting she is not sure how much to enter for her meals  Not clear  why her FASTING blood sugars are variable with a few good readings but mostly high readings  Has gained weight and is not exercising also        Side effects from medications : Nausea and diarrhea from any dose metformin, recurrent yeast infections from Jardiance  Glucose monitoring:  4 times a day       Glucometer:  Accu-Chek  .      Blood Glucose readings from Accu-Chek download   PRE-MEAL Fasting Lunch Dinner Bedtime Overall  Glucose range:  98-249  128-356  67-288  73-284   Mean/median:  170  235  163  155  183+/-74   POST-MEAL PC Breakfast PC Lunch PC Dinner  Glucose range:     Mean/median:   204    PREVIOUS readings:  PRE-MEAL Fasting Lunch Dinner Bedtime Overall  Glucose range:  69- 283  55-276  81-226  89-417   Mean/median:  114   136   152    Self-care:  Typical meal intake: Breakfast will be either  bagel, scrambled egg/oatmeal, juice.  Sometimes only toast and juice   Lunch may be sandwich or leftovers Dinner is variable, Snacks will be yogurt or chips            Dietician visit, most recent: 08/2017  Weight history:  Wt Readings from Last 3 Encounters:  06/25/19 293 lb 12.8 oz (133.3 kg)  02/18/19 285 lb 9.6 oz (129.5 kg)  08/27/18 279 lb 6.4 oz (126.7 kg)    `Glycemic control:   Lab Results  Component Value Date   HGBA1C 7.7 (H) 06/15/2019   HGBA1C 7.2 (A) 02/18/2019   HGBA1C 7.3 (A) 08/27/2018   Lab Results  Component Value Date   MICROALBUR 1.9 10/21/2018   LDLCALC 90 10/21/2018   CREATININE 0.96 06/15/2019   Lab Results  Component Value Date   MICRALBCREAT 1.1 10/21/2018    Lab Results  Component Value Date   FRUCTOSAMINE 218 10/21/2018   FRUCTOSAMINE 312 (H) 03/06/2018   FRUCTOSAMINE 266 02/12/2018      Allergies as of 06/25/2019   No Known Allergies     Medication List       Accurate as of June 25, 2019  1:04 PM. If you have any questions, ask your nurse or doctor.        Accu-Chek [employer] Kit  1 each by Does not apply route daily. Use to check blood sugar 5 times daily.   Accu-Chek Guide Me w/Device Kit 1 each by Does not apply route daily.   acetaZOLAMIDE 250 MG tablet Commonly known as: DIAMOX TAKE 1 TABLET BY MOUTH TWICE A DAY   ALPRAZolam 1 MG tablet Commonly known as: XANAX TAKE 1 TABLET BY MOUTH THREE TIMES A DAY AS NEEDED   amoxicillin-clavulanate 875-125 MG tablet Commonly known as: AUGMENTIN Take 1 tablet by mouth 2 (two) times daily.   atenolol 100 MG tablet Commonly known as: TENORMIN TAKE ONE TABLET BY MOUTH ONCE DAILY   buPROPion 150 MG 12 hr tablet Commonly known as: WELLBUTRIN SR TAKE 1 TABLET BY MOUTH EVERY DAY   Bydureon 2 MG Pen Generic drug: Exenatide ER INJECT 2 MG INTO THE SKIN ONCE A WEEK.   cetirizine 10 MG tablet Commonly known as: ZYRTEC Take 1 tablet (10 mg total) by mouth daily.   fluticasone 50 MCG/ACT nasal spray Commonly known as: FLONASE SPRAY 2 SPRAYS INTO EACH NOSTRIL EVERY DAY   glucose blood test strip 1 each by Other route daily. Use as instructed to check blood sugar before meals, 2 hours after meals, and before bed. DX:E11.65   glucose blood test strip 1 each by Other route as needed for other. Use as instructed to check blood sugar 4 times daily. DX:E11.65   Accu-Chek Guide test strip Generic drug: glucose blood USE AS INSTRUCTED TO TEST BLOOD SUGAR 5 TIMES DAILY.   Insulin Pen Needle 31G X 8 MM Misc Use with pen QD   insulin regular human CONCENTRATED 500 UNIT/ML injection Commonly known as: HUMULIN R INJECT 15 UNITS SUBCUTANEOUSLY BEFORE BREAKFAST+DINNER+10 UNITS BEFORE LUNCH ADJUST AS DIRECTED.   Insulin Syringe/Needle U-500 31G X 0.5 ML Misc 1 each by Does not apply route daily. Use to inject insulin 3 times daily.   OmniPod Dash 5 Pack Pods Misc 1 each by Does not apply route every 3 (three) days. APPLY ONE POD TO BODY EVERY 3 DAYS FOR INSULIN DELIVERY.   Ozempic (0.25 or 0.5 MG/DOSE) 2 MG/1.5ML  Sopn Generic drug: Semaglutide(0.25 or 0.5MG /DOS) Inject 0.5 mg into the skin once a week. Take 0.235mg  weekly for 1st 2 shots then 0.5 weekly Started by: Reather Littler, MD   rosuvastatin 10 MG tablet Commonly known as: CRESTOR TAKE 1 TABLET BY MOUTH EVERY DAY   Trintellix 10 MG Tabs tablet  Generic drug: vortioxetine HBr TAKE 1 TABLET BY MOUTH EVERY DAY       Allergies: No Known Allergies  Past Medical History:  Diagnosis Date  . Abnormal vaginal Pap smear    tx with cryotherapy  . Anxiety   . Depression   . Diabetes mellitus without complication (HCC)   . History of kidney stones    passed stone - no surgery  . Hypertension   . Migraine    otc med prn  . Obesity   . Pseudotumor cerebri   . SVD (spontaneous vaginal delivery)    x 3  . Tension headache     Past Surgical History:  Procedure Laterality Date  . ABDOMINAL HYSTERECTOMY    . ANTERIOR CERVICAL DECOMP/DISCECTOMY FUSION N/A 01/03/2016   Procedure: ANTERIOR CERVICAL DECOMPRESSION FUSION CERVICAL FIVE-SIX,CERVICAL SIX-SEVEN.;  Surgeon: Lisbeth Renshaw, MD;  Location: [facility];  Service: Neurosurgery;  Laterality: N/A;  right side approach  . CRYOTHERAPY    . LAPAROSCOPIC ASSISTED VAGINAL HYSTERECTOMY Bilateral 04/21/2014   Procedure: LAPAROSCOPIC ASSISTED VAGINAL HYSTERECTOMY, BILATERAL SALPINGECTOMY;  Surgeon: Lavina Hamman, MD;  Location: [facility];  Service: Gynecology;  Laterality: Bilateral;  . MANDIBLE FRACTURE SURGERY  2002   x 1  . TUBAL LIGATION  11/2003  . WISDOM TOOTH EXTRACTION      Family History  Problem Relation Age of Onset  . Hypertension Mother   . Diabetes Mother   . Diabetes Sister   . Hypertension Sister     Social History:  reports that she has never smoked. She has never used smokeless tobacco. She reports current alcohol use of about 1.0 standard drinks of alcohol per week. She reports that she does not use drugs.   Review of Systems     Lipid history: Taking Crestor only  for her hyperlipidemia  LDL is below 100 and triglycerides are last below 150, baseline for 54   Lab Results  Component Value Date   CHOL 154 10/21/2018   CHOL 168 05/20/2018   CHOL 188 01/09/2018   Lab Results  Component Value Date   HDL 33.90 (L) 10/21/2018   HDL 29.30 (L) 05/20/2018   HDL 29.90 (L) 01/09/2018   Lab Results  Component Value Date   LDLCALC 90 10/21/2018   LDLCALC 250 (H) 10/28/2015   LDLCALC 110 (H) 12/31/2012   Lab Results  Component Value Date   TRIG 149.0 10/21/2018   TRIG 308.0 (H) 05/20/2018   TRIG (H) 01/09/2018    454.0 Triglyceride is over 400; calculations on Lipids are invalid.   Lab Results  Component Value Date   CHOLHDL 5 10/21/2018   CHOLHDL 6 05/20/2018   CHOLHDL 6 01/09/2018   Lab Results  Component Value Date   LDLDIRECT 105.0 05/20/2018   LDLDIRECT 107.0 01/09/2018   LDLDIRECT 122 07/27/2016        Previous liver functions were high, now improved     Lab Results  Component Value Date   ALT 92 (H) 06/15/2019   Hypertension: This is being treated with atenolol 100 mg by her PCP    BP Readings from Last 3 Encounters:  06/25/19 124/86  02/18/19 128/82  08/27/18 118/76    Most recent foot exam:2/19  She is on Trintellix and bupropion for depression   LABS:  Office Visit on 06/25/2019  Component Date Value Ref Range Status  . POC Glucose 06/25/2019 154* 70 - 99 mg/dl Final    Physical Examination:  BP 124/86 (BP Location: Left Arm, Patient  Position: Sitting, Cuff Size: Large)   Pulse 86   Ht 5\' 1"  (1.549 m)   Wt 293 lb 12.8 oz (133.3 kg)   LMP 09/23/2013   SpO2 97%   BMI 55.51 kg/m        ASSESSMENT:  Diabetes type 2, with morbid obesity     Her A1c is 7.2 and has been fairly good with the pump  Currently on a regimen of insulin pump using U-500 insulin, Bydureon weekly  She is not bolusing with her pump consistently at mealtimes She does not appear to be motivated to control her blood sugars are  bolus for meals Also has poor understanding of need for bolusing consistently for all carbohydrate intake at meals She got the impression that she was only going to bolus if she can check her sugar which she cannot do that especially at work Has gained weight and is poorly managing her diet  Since most of her high readings are related to poor diet and lack of bolusing her pump settings do not need to be changed Also fasting readings are inconsistent and not clear why they are normal on some days Most likely high fasting readings are from inadequate bolusing in the evening and higher fat meals  Hypertension: Recommend that she have an ACE inhibitor or ARB included in her regimen and will defer to PCP                                 PLAN:   Given detailed instructions today on how to manage her pump, check blood sugars, bolus for all meals and snacks with carbohydrate She does bolus regardless of whether she can check her sugar or not This will be based on her meal size and can still use 8 to 12 units bolus amounts before starting to eat This will also likely help her overnight blood sugars since she is using U-500 insulin  She will be a good candidate for freestyle libre for blood sugar monitoring and will be to check her sugars while at work when she cannot do a fingerstick Encouraged her to get back into her water exercises Consultation with the dietitian is needed to help improve her understanding of better meals and how to enter carbohydrates in her pump  Trial of Ozempic instead of Bydureon since this will be overall more effective with blood sugar control, satiety and weight loss Showed her how to use the Ozempic pen and the titration schedule She will start when she finishes her Bydureon supply next month with 0.25 mg weekly for the first 2 shots and then 0.5 weekly  Counseling time on subjects discussed in assessment and plan sections is over 50% of today's 25 minute visit  Patient  Instructions  Bolus for ALL Carbs even if sugar not done         Reather Littler 06/25/2019, 1:04 PM   Note: This office note was prepared with Dragon voice recognition system technology. Any transcriptional errors that result from this process are unintentional.

## 2019-06-30 ENCOUNTER — Other Ambulatory Visit: Payer: Self-pay | Admitting: Endocrinology

## 2019-07-01 ENCOUNTER — Telehealth: Payer: Self-pay

## 2019-07-01 NOTE — Telephone Encounter (Signed)
-----   Message from Reather Littler, MD sent at 06/25/2019  1:09 PM EST ----- Regarding: appt. Needs to be seen in 6 weeks with labs

## 2019-07-01 NOTE — Telephone Encounter (Signed)
Patient scheduled for 08/19/2019

## 2019-07-03 DIAGNOSIS — E1165 Type 2 diabetes mellitus with hyperglycemia: Secondary | ICD-10-CM | POA: Diagnosis not present

## 2019-07-07 ENCOUNTER — Telehealth: Payer: Self-pay

## 2019-07-07 DIAGNOSIS — M1711 Unilateral primary osteoarthritis, right knee: Secondary | ICD-10-CM | POA: Diagnosis not present

## 2019-07-07 DIAGNOSIS — Z6841 Body Mass Index (BMI) 40.0 and over, adult: Secondary | ICD-10-CM | POA: Diagnosis not present

## 2019-07-07 DIAGNOSIS — M25561 Pain in right knee: Secondary | ICD-10-CM | POA: Diagnosis not present

## 2019-07-07 NOTE — Telephone Encounter (Signed)
PA initiated via [facility] [employer] for [employer].  PA was approved from 07/07/19 through 06/29/20. PA #: [id number] Interactive Call [id number]

## 2019-07-09 DIAGNOSIS — N393 Stress incontinence (female) (male): Secondary | ICD-10-CM | POA: Diagnosis not present

## 2019-07-09 DIAGNOSIS — N39498 Other specified urinary incontinence: Secondary | ICD-10-CM | POA: Diagnosis not present

## 2019-07-13 DIAGNOSIS — Z79899 Other long term (current) drug therapy: Secondary | ICD-10-CM | POA: Diagnosis not present

## 2019-07-21 ENCOUNTER — Telehealth: Payer: Self-pay | Admitting: Endocrinology

## 2019-07-21 DIAGNOSIS — F331 Major depressive disorder, recurrent, moderate: Secondary | ICD-10-CM | POA: Diagnosis not present

## 2019-07-21 DIAGNOSIS — F411 Generalized anxiety disorder: Secondary | ICD-10-CM | POA: Diagnosis not present

## 2019-07-21 DIAGNOSIS — F5081 Binge eating disorder: Secondary | ICD-10-CM | POA: Diagnosis not present

## 2019-07-21 NOTE — Telephone Encounter (Signed)
Previous chart notes indicate that PA has been initiated and approved. I will call pharmacy and notify them of this. If issues still exist, pharmacy should contact insurance directly.

## 2019-07-21 NOTE — Telephone Encounter (Signed)
Called pharmacy and informed them that the PA was approved on 07/07/19 and offered to give pharmacist PA approval number, to which she declined. Informed pharmacy that they should contact insurance directly because the problem seems to be with the pharmacy and insurance at this point, knowing that this office has completed all necessary prior authorization paperwork, etc. Pharmacist verbalized understanding and stated that she would contact the insurance company.

## 2019-07-21 NOTE — Telephone Encounter (Signed)
Called pt and left detailed voicemail informing her that I would be contacting the pharmacy and that her Ozempic has been approved since 07/07/19. Urged pt to return call if she should have any additional questions.

## 2019-07-21 NOTE — Telephone Encounter (Signed)
Patient called re: Ozempic PA. Patient's PHARM[facility] in [place] told patient that they had sent PA forms to Dr. Lucianne Muss twice but have not heard back from our office.  [facility] to [facility] Dr[facility] Laurena Slimmer to make Dr. Lucianne Muss aware of need for PA. Patient requests to be called at ph# [phone] to be advised on status of the above PA because patient has been without her medication (Ozempic) for 2 weeks.

## 2019-07-23 NOTE — Telephone Encounter (Signed)
Pharmacy called stating PA shows 1mg  but instructions show something different, pharmacy needs clarification. Patient has been in communication with pharmacy.

## 2019-07-23 NOTE — Telephone Encounter (Signed)
Attempted to call [place] [facility] but they have changed their menu options and no longer able to get a live representative. Completing new PA via fax from PA form obtained from [employer].

## 2019-07-23 NOTE — Telephone Encounter (Signed)
Faxed [place] [facility] PA request form. Waiting on PA fax confirmation stating that it went through successful.

## 2019-07-24 NOTE — Telephone Encounter (Signed)
Received a fax confirmation stating that the PA that was faxed did go through successfully.

## 2019-07-28 ENCOUNTER — Telehealth: Payer: Self-pay | Admitting: Endocrinology

## 2019-07-28 ENCOUNTER — Other Ambulatory Visit: Payer: Self-pay

## 2019-07-28 MED ORDER — OZEMPIC (1 MG/DOSE) 2 MG/1.5ML ~~LOC~~ SOPN: 0.5000 mg | PEN_INJECTOR | SUBCUTANEOUS | 1 refills | Status: DC

## 2019-07-28 NOTE — Telephone Encounter (Signed)
As previously documented, this PA was faxed into [facility] [facility] operations call center because PA can no longer be completed via phone. Currently awaiting a fax approval or denial of this medication.

## 2019-07-28 NOTE — Telephone Encounter (Signed)
Patient called to advise that she has still not be able to fill the RX for Ozempic. Pharmacy [facility] [place] states the dosage amount is incorrect.  Please contact pharmacy and/or patient

## 2019-07-28 NOTE — Telephone Encounter (Signed)
Called pt and made her aware of this and she verbalized understanding.

## 2019-07-29 DIAGNOSIS — F411 Generalized anxiety disorder: Secondary | ICD-10-CM | POA: Diagnosis not present

## 2019-07-29 DIAGNOSIS — F5081 Binge eating disorder: Secondary | ICD-10-CM | POA: Diagnosis not present

## 2019-07-29 DIAGNOSIS — F331 Major depressive disorder, recurrent, moderate: Secondary | ICD-10-CM | POA: Diagnosis not present

## 2019-07-31 ENCOUNTER — Encounter: Payer: Medicaid Other | Attending: Endocrinology | Admitting: Dietician

## 2019-07-31 DIAGNOSIS — E1165 Type 2 diabetes mellitus with hyperglycemia: Secondary | ICD-10-CM | POA: Diagnosis not present

## 2019-08-01 ENCOUNTER — Other Ambulatory Visit: Payer: Self-pay | Admitting: Family Medicine

## 2019-08-04 ENCOUNTER — Other Ambulatory Visit: Payer: Self-pay | Admitting: Family Medicine

## 2019-08-05 NOTE — Telephone Encounter (Signed)
Ok to refill??  Last office visit 08/25/2018.  Last refill 05/12/2019, #2 refills.

## 2019-08-10 ENCOUNTER — Other Ambulatory Visit: Payer: Self-pay | Admitting: Family Medicine

## 2019-08-12 ENCOUNTER — Other Ambulatory Visit: Payer: Self-pay

## 2019-08-12 ENCOUNTER — Other Ambulatory Visit (INDEPENDENT_AMBULATORY_CARE_PROVIDER_SITE_OTHER): Payer: Medicaid Other

## 2019-08-12 DIAGNOSIS — E1165 Type 2 diabetes mellitus with hyperglycemia: Secondary | ICD-10-CM | POA: Diagnosis not present

## 2019-08-12 DIAGNOSIS — Z794 Long term (current) use of insulin: Secondary | ICD-10-CM | POA: Diagnosis not present

## 2019-08-12 DIAGNOSIS — E782 Mixed hyperlipidemia: Secondary | ICD-10-CM

## 2019-08-12 LAB — LIPID PANEL
Cholesterol: 225 mg/dL — ABNORMAL HIGH (ref 0–200)
HDL: 31.4 mg/dL — ABNORMAL LOW (ref >39.00)
NonHDL: 193.35
Total CHOL/HDL Ratio: 7
Triglycerides: 272 mg/dL — ABNORMAL HIGH (ref 0.0–149.0)
VLDL: 54.4 mg/dL — ABNORMAL HIGH (ref 0.0–40.0)

## 2019-08-12 LAB — COMPREHENSIVE METABOLIC PANEL
ALT: 43 U/L — ABNORMAL HIGH (ref 0–35)
AST: 29 U/L (ref 0–37)
Albumin: 4.2 g/dL (ref 3.5–5.2)
Alkaline Phosphatase: 89 U/L (ref 39–117)
BUN: 19 mg/dL (ref 6–23)
CO2: 28 mEq/L (ref 19–32)
Calcium: 9.4 mg/dL (ref 8.4–10.5)
Chloride: 107 mEq/L (ref 96–112)
Creatinine, Ser: 1.09 mg/dL (ref 0.40–1.20)
GFR: 53.41 mL/min — ABNORMAL LOW (ref >60.00)
Glucose, Bld: 93 mg/dL (ref 70–99)
Potassium: 3.7 mEq/L (ref 3.5–5.1)
Sodium: 141 mEq/L (ref 135–145)
Total Bilirubin: 0.6 mg/dL (ref 0.2–1.2)
Total Protein: 7 g/dL (ref 6.0–8.3)

## 2019-08-12 LAB — LDL CHOLESTEROL, DIRECT: Direct LDL: 159 mg/dL

## 2019-08-13 ENCOUNTER — Other Ambulatory Visit: Payer: Medicaid Other

## 2019-08-13 DIAGNOSIS — F411 Generalized anxiety disorder: Secondary | ICD-10-CM | POA: Diagnosis not present

## 2019-08-13 DIAGNOSIS — F331 Major depressive disorder, recurrent, moderate: Secondary | ICD-10-CM | POA: Diagnosis not present

## 2019-08-13 DIAGNOSIS — F5081 Binge eating disorder: Secondary | ICD-10-CM | POA: Diagnosis not present

## 2019-08-13 LAB — FRUCTOSAMINE: Fructosamine: 225 umol/L (ref 0–285)

## 2019-08-19 ENCOUNTER — Other Ambulatory Visit: Payer: Self-pay

## 2019-08-19 ENCOUNTER — Encounter: Payer: Self-pay | Admitting: Endocrinology

## 2019-08-19 ENCOUNTER — Ambulatory Visit (INDEPENDENT_AMBULATORY_CARE_PROVIDER_SITE_OTHER): Payer: Medicaid Other | Admitting: Endocrinology

## 2019-08-19 VITALS — BP 122/74 | HR 69 | Ht 61.0 in | Wt 290.6 lb

## 2019-08-19 DIAGNOSIS — Z794 Long term (current) use of insulin: Secondary | ICD-10-CM

## 2019-08-19 DIAGNOSIS — E1165 Type 2 diabetes mellitus with hyperglycemia: Secondary | ICD-10-CM

## 2019-08-19 DIAGNOSIS — E782 Mixed hyperlipidemia: Secondary | ICD-10-CM

## 2019-08-19 NOTE — Progress Notes (Signed)
Patient ID: Kimberly [name], female   DOB: 09-Aug-1970, 49 y.o.   MRN: [id number]          Reason for Appointment: Follow-up for Type 2 Diabetes   History of Present Illness:          Date of diagnosis of type 2 diabetes mellitus: 12/2014   Background history:   She was initially tried on metformin but because of side effects as was stopped and she was tried on glipizide and subsequently [place].  She apparently took Jardiance for at least a couple of months but because of recurrent yeast infection she stopped it and she did not think it was helping her glucose also He was started on Lantus insulin in 12/2015 Subsequently Bydureon was added in 03/2016  Recent history:   INSULIN regimen with Omni pod/Dash::  Basal rate:  MN: 0.8, 5 AM = 1.3, 12 PM = 0.9, 5 PM = 1.0.  Total basal insulin 25.6 units I/C ratio 10, target 140 with corrections over 120, ISF: 60, and timing: 6 hours. Boluses 8-12 units with meals   Non-insulin hypoglycemic drugs the patient is taking are: None currently  Most recent A1c is 7.7, previously was 7.2, previously as high as 9.8 Fructosamine is 225  Current management, blo od sugar patterns and problems identified:  She has overall much better readings in the mornings and on an average blood sugars are better than the last visit  This is despite not taking any Bydureon and she has not been able to get Ozempic approved  She is trying to cut back on carbohydrates  However she has minimal boluses in her pump despite instructions on trying to bolus for meals containing carbohydrate  She said that she is cutting back on carbohydrate intake usually  On Sunday evening she had a salad and sweet tea and with 12 units bolus she had a low sugar down to 47 at 7 PM  She says she is trying to walk  Is down 3 pounds  Blood sugars are generally much better in the last 2 weeks but were mostly higher the previous 2 weeks  She said that sometimes she does not get her  supplies and may not wear her pump consistently  She is still not monitoring enough readings on an average daily to qualify for the CGM        Side effects from medications : Nausea and diarrhea from any dose metformin, recurrent yeast infections from Jardiance  Glucose monitoring:  3-4 times a day       Glucometer:  Accu-Chek  .      Blood Glucose readings from Accu-Chek download   PRE-MEAL  9-10 AM Lunch Dinner Bedtime Overall  Glucose range:  69-375  137-252  101-276  55-259   Mean/median:  151    145 +/-74   POST-MEAL PC Breakfast  afternoon PC Dinner  Glucose range:   66-283  47-233  Mean/median:   160  104    Previous readings:  PRE-MEAL Fasting Lunch Dinner Bedtime Overall  Glucose range:  98-249  128-356  67-288  73-284   Mean/median:  170  235  163  155  183+/-74   POST-MEAL PC Breakfast PC Lunch PC Dinner  Glucose range:     Mean/median:   204     Self-care:  Typical meal intake: Breakfast will be either  bagel, scrambled egg/oatmeal, juice.  Sometimes only toast and juice   Lunch may be sandwich or leftovers Dinner is  variable, Snacks will be yogurt or chips            Dietician visit, most recent: 08/2017              Weight history:  Wt Readings from Last 3 Encounters:  08/19/19 290 lb 9.6 oz (131.8 kg)  06/25/19 293 lb 12.8 oz (133.3 kg)  02/18/19 285 lb 9.6 oz (129.5 kg)    `Glycemic control:   Lab Results  Component Value Date   HGBA1C 7.7 (H) 06/15/2019   HGBA1C 7.2 (A) 02/18/2019   HGBA1C 7.3 (A) 08/27/2018   Lab Results  Component Value Date   MICROALBUR 1.9 10/21/2018   LDLCALC 90 10/21/2018   CREATININE 1.09 08/12/2019   Lab Results  Component Value Date   MICRALBCREAT 1.1 10/21/2018    Lab Results  Component Value Date   FRUCTOSAMINE 225 08/12/2019   FRUCTOSAMINE 218 10/21/2018   FRUCTOSAMINE 312 (H) 03/06/2018      Allergies as of 08/19/2019   No Known Allergies     Medication List       Accurate as of August 19, 2019  4:59 PM. If you have any questions, ask your nurse or doctor.        STOP taking these medications   amoxicillin-clavulanate 875-125 MG tablet Commonly known as: AUGMENTIN Stopped by: Reather Littler, MD   Bydureon 2 MG Pen Generic drug: Exenatide ER Stopped by: Reather Littler, MD   Ozempic (1 MG/DOSE) 2 MG/1.5ML Sopn Generic drug: Semaglutide (1 MG/DOSE) Stopped by: Reather Littler, MD     TAKE these medications   Accu-Chek FastClix Lancet Kit 1 each by Does not apply route daily. Use to check blood sugar 5 times daily.   Accu-Chek Guide Me w/Device Kit 1 each by Does not apply route daily.   acetaZOLAMIDE 250 MG tablet Commonly known as: DIAMOX TAKE 1 TABLET BY MOUTH TWICE A DAY   ALPRAZolam 1 MG tablet Commonly known as: XANAX TAKE 1 TABLET BY MOUTH THREE TIMES A DAY AS NEEDED   atenolol 100 MG tablet Commonly known as: TENORMIN TAKE 1 TABLET BY MOUTH EVERY DAY   buPROPion 150 MG 12 hr tablet Commonly known as: WELLBUTRIN SR TAKE 1 TABLET BY MOUTH EVERY DAY   cetirizine 10 MG tablet Commonly known as: ZYRTEC Take 1 tablet (10 mg total) by mouth daily.   fluticasone 50 MCG/ACT nasal spray Commonly known as: FLONASE SPRAY 2 SPRAYS INTO EACH NOSTRIL EVERY DAY   glucose blood test strip 1 each by Other route daily. Use as instructed to check blood sugar before meals, 2 hours after meals, and before bed. DX:E11.65   glucose blood test strip 1 each by Other route as needed for other. Use as instructed to check blood sugar 4 times daily. DX:E11.65   Accu-Chek Guide test strip Generic drug: glucose blood USE AS INSTRUCTED TO TEST BLOOD SUGAR 5 TIMES DAILY.   HUMULIN R 500 UNIT/ML injection Generic drug: insulin regular human CONCENTRATED INJECT 15 UNITS SUBCUTANEOUSLY BEFORE BREAKFAST+DINNER+10 UNITS BEFORE LUNCH ADJUST AS DIRECTED.   Insulin Pen Needle 31G X 8 MM Misc Use with pen QD   Insulin Syringe/Needle U-500 31G X 0.5 ML Misc 1 each by Does not  apply route daily. Use to inject insulin 3 times daily.   OmniPod Dash 5 Pack Pods Misc 1 each by Does not apply route every 3 (three) days. APPLY ONE POD TO BODY EVERY 3 DAYS FOR INSULIN DELIVERY.   rosuvastatin 10 MG  tablet Commonly known as: CRESTOR TAKE 1 TABLET BY MOUTH EVERY DAY   Trintellix 10 MG Tabs tablet Generic drug: vortioxetine HBr TAKE 1 TABLET BY MOUTH EVERY DAY       Allergies: No Known Allergies  Past Medical History:  Diagnosis Date  . Abnormal vaginal Pap smear    tx with cryotherapy  . Anxiety   . Depression   . Diabetes mellitus without complication (HCC)   . History of kidney stones    passed stone - no surgery  . Hypertension   . Migraine    otc med prn  . Obesity   . Pseudotumor cerebri   . SVD (spontaneous vaginal delivery)    x 3  . Tension headache     Past Surgical History:  Procedure Laterality Date  . ABDOMINAL HYSTERECTOMY    . ANTERIOR CERVICAL DECOMP/DISCECTOMY FUSION N/A 01/03/2016   Procedure: ANTERIOR CERVICAL DECOMPRESSION FUSION CERVICAL FIVE-SIX,CERVICAL SIX-SEVEN.;  Surgeon: Lisbeth Renshaw, MD;  Location: [facility];  Service: Neurosurgery;  Laterality: N/A;  right side approach  . CRYOTHERAPY    . LAPAROSCOPIC ASSISTED VAGINAL HYSTERECTOMY Bilateral 04/21/2014   Procedure: LAPAROSCOPIC ASSISTED VAGINAL HYSTERECTOMY, BILATERAL SALPINGECTOMY;  Surgeon: Lavina Hamman, MD;  Location: [facility];  Service: Gynecology;  Laterality: Bilateral;  . MANDIBLE FRACTURE SURGERY  2002   x 1  . TUBAL LIGATION  11/2003  . WISDOM TOOTH EXTRACTION      Family History  Problem Relation Age of Onset  . Hypertension Mother   . Diabetes Mother   . Diabetes Sister   . Hypertension Sister     Social History:  reports that she has never smoked. She has never used smokeless tobacco. She reports current alcohol use of about 1.0 standard drinks of alcohol per week. She reports that she does not use drugs.   Review of Systems   Lipid  history: Taking Crestor for her hyperlipidemia  LDL which was below 100 and triglycerides 149 are now both above target She is not sure if she is taking her Crestor regularly     Lab Results  Component Value Date   CHOL 225 (H) 08/12/2019   CHOL 154 10/21/2018   CHOL 168 05/20/2018   Lab Results  Component Value Date   HDL 31.40 (L) 08/12/2019   HDL 33.90 (L) 10/21/2018   HDL 29.30 (L) 05/20/2018   Lab Results  Component Value Date   LDLCALC 90 10/21/2018   LDLCALC 250 (H) 10/28/2015   LDLCALC 110 (H) 12/31/2012   Lab Results  Component Value Date   TRIG 272.0 (H) 08/12/2019   TRIG 149.0 10/21/2018   TRIG 308.0 (H) 05/20/2018   Lab Results  Component Value Date   CHOLHDL 7 08/12/2019   CHOLHDL 5 10/21/2018   CHOLHDL 6 05/20/2018   Lab Results  Component Value Date   LDLDIRECT 159.0 08/12/2019   LDLDIRECT 105.0 05/20/2018   LDLDIRECT 107.0 01/09/2018        Liver functions have been variable, mildly increased now     Lab Results  Component Value Date   ALT 43 (H) 08/12/2019   Hypertension: This is being treated with atenolol 100 mg by her PCP  Not on ACE inhibitor  BP Readings from Last 3 Encounters:  08/19/19 122/74  06/25/19 124/86  02/18/19 128/82    Most recent foot exam:2/19  She is on Trintellix and bupropion for depression   LABS:  No visits with results within 1 Week(s) from this visit.  Latest known  visit with results is:  Lab on 08/12/2019  Component Date Value Ref Range Status  . Cholesterol 08/12/2019 225* 0 - 200 mg/dL Final   ATP III Classification       Desirable:  < 200 mg/dL               Borderline High:  200 - 239 mg/dL          High:  > = 161 mg/dL  . Triglycerides 08/12/2019 272.0* 0.0 - 149.0 mg/dL Final   Normal:  <096 mg/dLBorderline High:  150 - 199 mg/dL  . HDL 08/12/2019 31.40* >39.00 mg/dL Final  . VLDL 04/54/0981 54.4* 0.0 - 40.0 mg/dL Final  . Total CHOL/HDL Ratio 08/12/2019 7   Final                  Men           Women1/2 Average Risk     3.4          3.3Average Risk          5.0          4.42X Average Risk          9.6          7.13X Average Risk          15.0          11.0                      . NonHDL 08/12/2019 193.35   Final   NOTE:  Non-HDL goal should be 30 mg/dL higher than patient's LDL goal (i.e. LDL goal of < 70 mg/dL, would have non-HDL goal of < 100 mg/dL)  . Fructosamine 08/12/2019 225  0 - 285 umol/L Final   Comment: Published reference interval for apparently healthy subjects between age 89 and 37 is 66 - 285 umol/L and in a poorly controlled diabetic population is 228 - 563 umol/L with a mean of 396 umol/L.   Marland Kitchen Sodium 08/12/2019 141  135 - 145 mEq/L Final  . Potassium 08/12/2019 3.7  3.5 - 5.1 mEq/L Final  . Chloride 08/12/2019 107  96 - 112 mEq/L Final  . CO2 08/12/2019 28  19 - 32 mEq/L Final  . Glucose, Bld 08/12/2019 93  70 - 99 mg/dL Final  . BUN 19/14/7829 19  6 - 23 mg/dL Final  . Creatinine, Ser 08/12/2019 1.09  0.40 - 1.20 mg/dL Final  . Total Bilirubin 08/12/2019 0.6  0.2 - 1.2 mg/dL Final  . Alkaline Phosphatase 08/12/2019 89  39 - 117 U/L Final  . AST 08/12/2019 29  0 - 37 U/L Final  . ALT 08/12/2019 43* 0 - 35 U/L Final  . Total Protein 08/12/2019 7.0  6.0 - 8.3 g/dL Final  . Albumin 56/21/3086 4.2  3.5 - 5.2 g/dL Final  . GFR 57/84/6962 53.41* >60.00 mL/min Final  . Calcium 08/12/2019 9.4  8.4 - 10.5 mg/dL Final  . Direct LDL 95/28/4132 159.0  mg/dL Final   Optimal:  <440 mg/dLNear or Above Optimal:  100-129 mg/dLBorderline High:  130-159 mg/dLHigh:  160-189 mg/dLVery High:  >190 mg/dL    Physical Examination:  BP 122/74   Pulse 69   Ht 5\' 1"  (1.549 m)   Wt 290 lb 9.6 oz (131.8 kg)   LMP 09/23/2013   SpO2 97%   BMI 54.91 kg/m        ASSESSMENT:  Diabetes type 2, with morbid obesity  Her A1c was 7.7 However fructosamine is 225 indicating recent improvement  Currently on a regimen of insulin pump using U-500 insulin  She appears to have done  better with her diet and with cutting back on carbohydrates at least recently has much better readings including low normal blood sugars at times With her usual boluses she is sometimes getting hypoglycemia and is mostly not doing any boluses now Previously likely had higher readings from not managing her diet well especially with eating out and high carbohydrate intake She also has much better readings overall fasting without overnight hypoglycemia  Even without doing her Bydureon injections she is having good control and has lost 3 pounds Also trying to walk more However still should benefit from a GLP-1 drug in the long run and also help with further reduction in insulin regimen and weight loss As before her Medicaid approval will take much more time and unable to contact the services  Hypertension: Controlled but not on ACE inhibitor/ARB, will defer to PCP                             LIPIDS: Likely she is not taking her rosuvastatin with significant increase in all her lipid parameters despite relatively better diet   PLAN:    She will reduce her basal rates except overnight by 0.1 across-the-board She will start bolusing more consistently for meals that have more than 25 g of carbohydrate She will need to bolus 30-minute before eating Again need to try and check blood sugar 4 times a day to enable her to get CGM Continue cutting back on portions Walk more regularly She will start Ozempic when approved by Medicaid  She needs to check and see if she is needing another prescription for the Crestor and let us know if she has any questions about this Follow-up in 3 months unless having a problem  Total visit time for evaluation and management of multiple problems, device download and interpretation of glucose and pump download and counseling = 30 minutes  Patient Instructions  Bolus 1/2 the amount and 30 min before meal, for >25g carbs  Only bolus for high sugar and not enter carbs  again after a meal  Get Rosuvastatin         Reather Littler 08/19/2019, 4:59 PM   Note: This office note was prepared with Dragon voice recognition system technology. Any transcriptional errors that result from this process are unintentional.

## 2019-08-19 NOTE — Patient Instructions (Signed)
Bolus 1/2 the amount and 30 min before meal, for >25g carbs  Only bolus for high sugar and not enter carbs again after a meal  Get Rosuvastatin

## 2019-08-27 DIAGNOSIS — N3941 Urge incontinence: Secondary | ICD-10-CM | POA: Diagnosis not present

## 2019-08-28 DIAGNOSIS — E1165 Type 2 diabetes mellitus with hyperglycemia: Secondary | ICD-10-CM | POA: Diagnosis not present

## 2019-08-31 ENCOUNTER — Other Ambulatory Visit: Payer: Self-pay | Admitting: Family Medicine

## 2019-09-02 DIAGNOSIS — Z794 Long term (current) use of insulin: Secondary | ICD-10-CM | POA: Diagnosis not present

## 2019-09-02 DIAGNOSIS — H47323 Drusen of optic disc, bilateral: Secondary | ICD-10-CM | POA: Diagnosis not present

## 2019-09-02 DIAGNOSIS — G932 Benign intracranial hypertension: Secondary | ICD-10-CM | POA: Diagnosis not present

## 2019-09-02 DIAGNOSIS — E119 Type 2 diabetes mellitus without complications: Secondary | ICD-10-CM | POA: Diagnosis not present

## 2019-09-02 DIAGNOSIS — H1045 Other chronic allergic conjunctivitis: Secondary | ICD-10-CM | POA: Diagnosis not present

## 2019-09-02 LAB — HM DIABETES EYE EXAM

## 2019-09-07 ENCOUNTER — Other Ambulatory Visit: Payer: Self-pay

## 2019-09-07 NOTE — Telephone Encounter (Signed)
Does patient need to start out at 0.25mg  weekly or 0.5mg ?

## 2019-09-07 NOTE — Telephone Encounter (Signed)
0.25 mg weekly for the first 4 weeks and then 0.5 weekly

## 2019-09-07 NOTE — Telephone Encounter (Signed)
Called [employer] call center and completed PA for Ozempic 0.5mg  once weekly injection.  PA was approved from 09/07/19 through 09/06/20. PA #:[id number] Ref #:[id number]

## 2019-09-08 ENCOUNTER — Other Ambulatory Visit: Payer: Self-pay

## 2019-09-08 MED ORDER — OZEMPIC (0.25 OR 0.5 MG/DOSE) 2 MG/1.5ML ~~LOC~~ SOPN: 0.2500 mg | PEN_INJECTOR | SUBCUTANEOUS | 1 refills | Status: DC

## 2019-09-08 NOTE — Telephone Encounter (Signed)
Rx sent to pharmacy and called pt and left detailed voicemail with MD instructions on dosing.

## 2019-09-12 ENCOUNTER — Other Ambulatory Visit: Payer: Self-pay | Admitting: Family Medicine

## 2019-09-25 DIAGNOSIS — E1165 Type 2 diabetes mellitus with hyperglycemia: Secondary | ICD-10-CM | POA: Diagnosis not present

## 2019-09-30 ENCOUNTER — Other Ambulatory Visit: Payer: Self-pay | Admitting: Family Medicine

## 2019-10-22 ENCOUNTER — Encounter: Payer: Self-pay | Admitting: *Deleted

## 2019-10-26 DIAGNOSIS — F5081 Binge eating disorder: Secondary | ICD-10-CM | POA: Diagnosis not present

## 2019-10-26 DIAGNOSIS — F331 Major depressive disorder, recurrent, moderate: Secondary | ICD-10-CM | POA: Diagnosis not present

## 2019-10-26 DIAGNOSIS — F411 Generalized anxiety disorder: Secondary | ICD-10-CM | POA: Diagnosis not present

## 2019-10-27 DIAGNOSIS — F5081 Binge eating disorder: Secondary | ICD-10-CM | POA: Diagnosis not present

## 2019-10-27 DIAGNOSIS — F411 Generalized anxiety disorder: Secondary | ICD-10-CM | POA: Diagnosis not present

## 2019-10-27 DIAGNOSIS — F331 Major depressive disorder, recurrent, moderate: Secondary | ICD-10-CM | POA: Diagnosis not present

## 2019-10-30 DIAGNOSIS — E1165 Type 2 diabetes mellitus with hyperglycemia: Secondary | ICD-10-CM | POA: Diagnosis not present

## 2019-11-17 NOTE — Progress Notes (Deleted)
Patient ID: Kimberly [name], female   DOB: June 30, 1970, 49 y.o.   MRN: [id number]          Reason for Appointment: Follow-up for Type 2 Diabetes   History of Present Illness:          Date of diagnosis of type 2 diabetes mellitus: 12/2014   Background history:   She was initially tried on metformin but because of side effects as was stopped and she was tried on glipizide and subsequently [place].  She apparently took Jardiance for at least a couple of months but because of recurrent yeast infection she stopped it and she did not think it was helping her glucose also He was started on Lantus insulin in 12/2015 Subsequently Bydureon was added in 03/2016  Recent history:   INSULIN regimen with Omni pod/Dash::  Basal rate:  MN: 0.8, 5 AM = 1.3, 12 PM = 0.9, 5 PM = 1.0.  Total basal insulin 25.6 units I/C ratio 10, target 140 with corrections over 120, ISF: 60, and timing: 6 hours. Boluses 8-12 units with meals   Non-insulin hypoglycemic drugs the patient is taking are: None currently  Most recent A1c is 7.7, previously was 7.2, previously as high as 9.8 Fructosamine is 225  Current management, blo od sugar patterns and problems identified:  She has overall much better readings in the mornings and on an average blood sugars are better than the last visit  This is despite not taking any Bydureon and she has not been able to get Ozempic approved  She is trying to cut back on carbohydrates  However she has minimal boluses in her pump despite instructions on trying to bolus for meals containing carbohydrate  She said that she is cutting back on carbohydrate intake usually  On Sunday evening she had a salad and sweet tea and with 12 units bolus she had a low sugar down to 47 at 7 PM  She says she is trying to walk  Is down 3 pounds  Blood sugars are generally much better in the last 2 weeks but were mostly higher the previous 2 weeks  She said that sometimes she does not get her  supplies and may not wear her pump consistently  She is still not monitoring enough readings on an average daily to qualify for the CGM        Side effects from medications : Nausea and diarrhea from any dose metformin, recurrent yeast infections from Jardiance  Glucose monitoring:  3-4 times a day       Glucometer:  Accu-Chek  .      Blood Glucose readings from Accu-Chek download   PRE-MEAL  9-10 AM Lunch Dinner Bedtime Overall  Glucose range:  69-375  137-252  101-276  55-259   Mean/median:  151    145 +/-74   POST-MEAL PC Breakfast  afternoon PC Dinner  Glucose range:   66-283  47-233  Mean/median:   160  104    Previous readings:  PRE-MEAL Fasting Lunch Dinner Bedtime Overall  Glucose range:  98-249  128-356  67-288  73-284   Mean/median:  170  235  163  155  183+/-74   POST-MEAL PC Breakfast PC Lunch PC Dinner  Glucose range:     Mean/median:   204     Self-care:  Typical meal intake: Breakfast will be either  bagel, scrambled egg/oatmeal, juice.  Sometimes only toast and juice   Lunch may be sandwich or leftovers Dinner is  variable, Snacks will be yogurt or chips            Dietician visit, most recent: 08/2017              Weight history:  Wt Readings from Last 3 Encounters:  08/19/19 290 lb 9.6 oz (131.8 kg)  06/25/19 293 lb 12.8 oz (133.3 kg)  02/18/19 285 lb 9.6 oz (129.5 kg)    `Glycemic control:   Lab Results  Component Value Date   HGBA1C 7.7 (H) 06/15/2019   HGBA1C 7.2 (A) 02/18/2019   HGBA1C 7.3 (A) 08/27/2018   Lab Results  Component Value Date   MICROALBUR 1.9 10/21/2018   LDLCALC 90 10/21/2018   CREATININE 1.09 08/12/2019   Lab Results  Component Value Date   MICRALBCREAT 1.1 10/21/2018    Lab Results  Component Value Date   FRUCTOSAMINE 225 08/12/2019   FRUCTOSAMINE 218 10/21/2018   FRUCTOSAMINE 312 (H) 03/06/2018      Allergies as of 11/18/2019   No Known Allergies     Medication List       Accurate as of Nov 17, 2019  9:18 PM. If you have any questions, ask your nurse or doctor.        Accu-Chek [employer] Kit 1 each by Does not apply route daily. Use to check blood sugar 5 times daily.   Accu-Chek Guide Me w/Device Kit 1 each by Does not apply route daily.   acetaZOLAMIDE 250 MG tablet Commonly known as: DIAMOX TAKE 1 TABLET BY MOUTH TWICE A DAY   ALPRAZolam 1 MG tablet Commonly known as: XANAX TAKE 1 TABLET BY MOUTH THREE TIMES A DAY AS NEEDED   atenolol 100 MG tablet Commonly known as: TENORMIN TAKE 1 TABLET BY MOUTH EVERY DAY   buPROPion 150 MG 12 hr tablet Commonly known as: WELLBUTRIN SR TAKE 1 TABLET BY MOUTH EVERY DAY   cetirizine 10 MG tablet Commonly known as: ZYRTEC TAKE 1 TABLET BY MOUTH EVERY DAY   fluticasone 50 MCG/ACT nasal spray Commonly known as: FLONASE SPRAY 2 SPRAYS INTO EACH NOSTRIL EVERY DAY   glucose blood test strip 1 each by Other route daily. Use as instructed to check blood sugar before meals, 2 hours after meals, and before bed. DX:E11.65   glucose blood test strip 1 each by Other route as needed for other. Use as instructed to check blood sugar 4 times daily. DX:E11.65   Accu-Chek Guide test strip Generic drug: glucose blood USE AS INSTRUCTED TO TEST BLOOD SUGAR 5 TIMES DAILY.   HUMULIN R 500 UNIT/ML injection Generic drug: insulin regular human CONCENTRATED INJECT 15 UNITS SUBCUTANEOUSLY BEFORE BREAKFAST+DINNER+10 UNITS BEFORE LUNCH ADJUST AS DIRECTED.   Insulin Pen Needle 31G X 8 MM Misc Use with pen QD   Insulin Syringe/Needle U-500 31G X 0.5 ML Misc 1 each by Does not apply route daily. Use to inject insulin 3 times daily.   OmniPod Dash 5 Pack Pods Misc 1 each by Does not apply route every 3 (three) days. APPLY ONE POD TO BODY EVERY 3 DAYS FOR INSULIN DELIVERY.   Ozempic (0.25 or 0.5 MG/DOSE) 2 MG/1.5ML Sopn Generic drug: Semaglutide(0.25 or 0.5MG /DOS) Inject 0.25 mg into the skin once a week. Inject 0.25mg  under the skin  once weekly for 4 weeks and then increase to 0.5mg  weekly.   rosuvastatin 10 MG tablet Commonly known as: CRESTOR TAKE 1 TABLET BY MOUTH EVERY DAY   Trintellix 10 MG Tabs tablet Generic drug: vortioxetine HBr  TAKE 1 TABLET BY MOUTH EVERY DAY       Allergies: No Known Allergies  Past Medical History:  Diagnosis Date  . Abnormal vaginal Pap smear    tx with cryotherapy  . Anxiety   . Depression   . Diabetes mellitus without complication (HCC)   . History of kidney stones    passed stone - no surgery  . Hypertension   . Migraine    otc med prn  . Obesity   . Pseudotumor cerebri   . SVD (spontaneous vaginal delivery)    x 3  . Tension headache     Past Surgical History:  Procedure Laterality Date  . ABDOMINAL HYSTERECTOMY    . ANTERIOR CERVICAL DECOMP/DISCECTOMY FUSION N/A 01/03/2016   Procedure: ANTERIOR CERVICAL DECOMPRESSION FUSION CERVICAL FIVE-SIX,CERVICAL SIX-SEVEN.;  Surgeon: Lisbeth Renshaw, MD;  Location: [facility];  Service: Neurosurgery;  Laterality: N/A;  right side approach  . CRYOTHERAPY    . LAPAROSCOPIC ASSISTED VAGINAL HYSTERECTOMY Bilateral 04/21/2014   Procedure: LAPAROSCOPIC ASSISTED VAGINAL HYSTERECTOMY, BILATERAL SALPINGECTOMY;  Surgeon: Lavina Hamman, MD;  Location: [facility];  Service: Gynecology;  Laterality: Bilateral;  . MANDIBLE FRACTURE SURGERY  2002   x 1  . TUBAL LIGATION  11/2003  . WISDOM TOOTH EXTRACTION      Family History  Problem Relation Age of Onset  . Hypertension Mother   . Diabetes Mother   . Diabetes Sister   . Hypertension Sister     Social History:  reports that she has never smoked. She has never used smokeless tobacco. She reports current alcohol use of about 1.0 standard drinks of alcohol per week. She reports that she does not use drugs.   Review of Systems   Lipid history: Taking Crestor for her hyperlipidemia  LDL which was below 100 and triglycerides 149 are now both above target She is not sure if she  is taking her Crestor regularly     Lab Results  Component Value Date   CHOL 225 (H) 08/12/2019   CHOL 154 10/21/2018   CHOL 168 05/20/2018   Lab Results  Component Value Date   HDL 31.40 (L) 08/12/2019   HDL 33.90 (L) 10/21/2018   HDL 29.30 (L) 05/20/2018   Lab Results  Component Value Date   LDLCALC 90 10/21/2018   LDLCALC 250 (H) 10/28/2015   LDLCALC 110 (H) 12/31/2012   Lab Results  Component Value Date   TRIG 272.0 (H) 08/12/2019   TRIG 149.0 10/21/2018   TRIG 308.0 (H) 05/20/2018   Lab Results  Component Value Date   CHOLHDL 7 08/12/2019   CHOLHDL 5 10/21/2018   CHOLHDL 6 05/20/2018   Lab Results  Component Value Date   LDLDIRECT 159.0 08/12/2019   LDLDIRECT 105.0 05/20/2018   LDLDIRECT 107.0 01/09/2018        Liver functions have been variable, mildly increased now     Lab Results  Component Value Date   ALT 43 (H) 08/12/2019   Hypertension: This is being treated with atenolol 100 mg by her PCP  Not on ACE inhibitor  BP Readings from Last 3 Encounters:  08/19/19 122/74  06/25/19 124/86  02/18/19 128/82    Most recent foot exam:2/19  She is on Trintellix and bupropion for depression   LABS:  No visits with results within 1 Week(s) from this visit.  Latest known visit with results is:  Abstract on 09/03/2019  Component Date Value Ref Range Status  . HM Diabetic Eye Exam 09/02/2019 No  Retinopathy  No Retinopathy Final    Physical Examination:  LMP 09/23/2013        ASSESSMENT:  Diabetes type 2, with morbid obesity     Her A1c was 7.7 However fructosamine is 225 indicating recent improvement  Currently on a regimen of insulin pump using U-500 insulin  She appears to have done better with her diet and with cutting back on carbohydrates at least recently has much better readings including low normal blood sugars at times With her usual boluses she is sometimes getting hypoglycemia and is mostly not doing any boluses  now Previously likely had higher readings from not managing her diet well especially with eating out and high carbohydrate intake She also has much better readings overall fasting without overnight hypoglycemia  Even without doing her Bydureon injections she is having good control and has lost 3 pounds Also trying to walk more However still should benefit from a GLP-1 drug in the long run and also help with further reduction in insulin regimen and weight loss As before her Medicaid approval will take much more time and unable to contact the services  Hypertension: Controlled but not on ACE inhibitor/ARB, will defer to PCP                             LIPIDS: Likely she is not taking her rosuvastatin with significant increase in all her lipid parameters despite relatively better diet   PLAN:    She will reduce her basal rates except overnight by 0.1 across-the-board She will start bolusing more consistently for meals that have more than 25 g of carbohydrate She will need to bolus 30-minute before eating Again need to try and check blood sugar 4 times a day to enable her to get CGM Continue cutting back on portions Walk more regularly She will start Ozempic when approved by Medicaid  She needs to check and see if she is needing another prescription for the Crestor and let us know if she has any questions about this Follow-up in 3 months unless having a problem  Total visit time for evaluation and management of multiple problems, device download and interpretation of glucose and pump download and counseling = 30 minutes  There are no Patient Instructions on file for this visit.        Reather Littler 11/17/2019, 9:18 PM   Note: This office note was prepared with Dragon voice recognition system technology. Any transcriptional errors that result from this process are unintentional.

## 2019-11-18 ENCOUNTER — Ambulatory Visit: Payer: Medicaid Other | Admitting: Endocrinology

## 2019-11-24 ENCOUNTER — Other Ambulatory Visit: Payer: Self-pay | Admitting: Family Medicine

## 2019-11-25 NOTE — Telephone Encounter (Signed)
Ok to refill??  Last office visit 08/25/2018.  Last refill 08/06/2019, #2 refills.   Of note, letter sent to patient to schedule OV.

## 2019-11-27 DIAGNOSIS — E1165 Type 2 diabetes mellitus with hyperglycemia: Secondary | ICD-10-CM | POA: Diagnosis not present

## 2019-11-30 ENCOUNTER — Other Ambulatory Visit: Payer: Self-pay

## 2019-11-30 MED ORDER — OMNIPOD DASH PODS (GEN 4) MISC: 0 refills | Status: DC

## 2019-12-07 ENCOUNTER — Ambulatory Visit: Payer: Medicaid Other | Admitting: Endocrinology

## 2019-12-21 ENCOUNTER — Other Ambulatory Visit: Payer: Self-pay | Admitting: Family Medicine

## 2019-12-21 NOTE — Telephone Encounter (Signed)
Ok to refill??  Last office visit 08/25/2018.  Last refill 11/26/2019.  Of note, patient has appointment on 01/26/2020.

## 2019-12-24 DIAGNOSIS — E1165 Type 2 diabetes mellitus with hyperglycemia: Secondary | ICD-10-CM | POA: Diagnosis not present

## 2020-01-01 ENCOUNTER — Ambulatory Visit: Payer: Medicaid Other | Admitting: Endocrinology

## 2020-01-14 ENCOUNTER — Other Ambulatory Visit: Payer: Self-pay | Admitting: Family Medicine

## 2020-01-14 DIAGNOSIS — Z1389 Encounter for screening for other disorder: Secondary | ICD-10-CM | POA: Diagnosis not present

## 2020-01-14 DIAGNOSIS — Z130 Encounter for screening for diseases of the blood and blood-forming organs and certain disorders involving the immune mechanism: Secondary | ICD-10-CM | POA: Diagnosis not present

## 2020-01-14 DIAGNOSIS — Z01419 Encounter for gynecological examination (general) (routine) without abnormal findings: Secondary | ICD-10-CM | POA: Diagnosis not present

## 2020-01-14 DIAGNOSIS — Z6841 Body Mass Index (BMI) 40.0 and over, adult: Secondary | ICD-10-CM | POA: Diagnosis not present

## 2020-01-14 DIAGNOSIS — N951 Menopausal and female climacteric states: Secondary | ICD-10-CM | POA: Diagnosis not present

## 2020-01-14 DIAGNOSIS — Z1231 Encounter for screening mammogram for malignant neoplasm of breast: Secondary | ICD-10-CM | POA: Diagnosis not present

## 2020-01-14 DIAGNOSIS — N393 Stress incontinence (female) (male): Secondary | ICD-10-CM | POA: Diagnosis not present

## 2020-01-23 ENCOUNTER — Other Ambulatory Visit: Payer: Self-pay | Admitting: Family Medicine

## 2020-01-24 DIAGNOSIS — E1165 Type 2 diabetes mellitus with hyperglycemia: Secondary | ICD-10-CM | POA: Diagnosis not present

## 2020-01-25 NOTE — Telephone Encounter (Signed)
Ok to refill??  Last office visit 08/26/2018.  Last refill 12/21/2019.  Of note, patient has appointment on 01/26/2020.

## 2020-01-26 ENCOUNTER — Other Ambulatory Visit: Payer: Self-pay

## 2020-01-26 ENCOUNTER — Ambulatory Visit: Payer: Medicaid Other | Admitting: Family Medicine

## 2020-01-26 VITALS — BP 108/80 | HR 63 | Temp 97.5°F | Ht 61.0 in | Wt 272.0 lb

## 2020-01-26 DIAGNOSIS — F321 Major depressive disorder, single episode, moderate: Secondary | ICD-10-CM | POA: Diagnosis not present

## 2020-01-26 MED ORDER — FUROSEMIDE 40 MG PO TABS: 40.0000 mg | ORAL_TABLET | Freq: Every day | ORAL | 1 refills | Status: DC | PRN

## 2020-01-26 MED ORDER — ALPRAZOLAM 1 MG PO TABS: 1.0000 mg | ORAL_TABLET | Freq: Three times a day (TID) | ORAL | 0 refills | Status: DC | PRN

## 2020-01-26 MED ORDER — BUPROPION HCL ER (XL) 300 MG PO TB24: 300.0000 mg | ORAL_TABLET | Freq: Every day | ORAL | 5 refills | Status: DC

## 2020-01-26 NOTE — Progress Notes (Signed)
Subjective:    Patient ID: Kimberly [name], female    DOB: 27-Oct-1970, 49 y.o.   MRN: [id number]  Anxiety     Patient is now seeing a endocrinologist to manage her diabetes.  Her concern today is uncontrolled depression.  I have not seen the patient in over a year.  Last fall, her ex-husband and the father of her children committed suicide.  All of her children are grieving terribly and in therapy.  The stress on the patient has her extremely anxious and depressed.  She is unable to afford counseling.  She is on the waiting list for counseling at hospice.  She states that twice a week she will "lie and tell her family that she is working late" she can simply drive to the park and cry in private.  She reports anhedonia.  She states that she needs the Xanax twice a day on average just to help make it through the day so she can work.  She also reports swelling in her legs that comes and goes.  She has pitchers where her legs are severely swollen.  Today she has trace pitting edema in both feet and both legs.  I believe the patient likely has an element of diastolic dysfunction due to obesity and also likely some venous insufficiency causing the swelling.  She denies any chest pain or shortness of breath Past Medical History:  Diagnosis Date  . Abnormal vaginal Pap smear    tx with cryotherapy  . Anxiety   . Depression   . Diabetes mellitus without complication (HCC)   . History of kidney stones    passed stone - no surgery  . Hypertension   . Migraine    otc med prn  . Obesity   . Pseudotumor cerebri   . SVD (spontaneous vaginal delivery)    x 3  . Tension headache    Past Surgical History:  Procedure Laterality Date  . ABDOMINAL HYSTERECTOMY    . ANTERIOR CERVICAL DECOMP/DISCECTOMY FUSION N/A 01/03/2016   Procedure: ANTERIOR CERVICAL DECOMPRESSION FUSION CERVICAL FIVE-SIX,CERVICAL SIX-SEVEN.;  Surgeon: Lisbeth Renshaw, MD;  Location: [facility];  Service: Neurosurgery;   Laterality: N/A;  right side approach  . CRYOTHERAPY    . LAPAROSCOPIC ASSISTED VAGINAL HYSTERECTOMY Bilateral 04/21/2014   Procedure: LAPAROSCOPIC ASSISTED VAGINAL HYSTERECTOMY, BILATERAL SALPINGECTOMY;  Surgeon: Lavina Hamman, MD;  Location: [facility];  Service: Gynecology;  Laterality: Bilateral;  . MANDIBLE FRACTURE SURGERY  2002   x 1  . TUBAL LIGATION  11/2003  . WISDOM TOOTH EXTRACTION     Current Outpatient Medications on File Prior to Visit  Medication Sig Dispense Refill  . ACCU-CHEK GUIDE test strip USE AS INSTRUCTED TO TEST BLOOD SUGAR 5 TIMES DAILY. 100 each 12  . acetaZOLAMIDE (DIAMOX) 250 MG tablet TAKE 1 TABLET BY MOUTH TWICE A DAY 180 tablet 0  . ALPRAZolam (XANAX) 1 MG tablet TAKE 1 TABLET BY MOUTH THREE TIMES A DAY AS NEEDED. 90 tablet 0  . atenolol (TENORMIN) 100 MG tablet TAKE 1 TABLET BY MOUTH EVERY DAY 30 tablet 0  . Blood Glucose Monitoring Suppl (ACCU-CHEK GUIDE ME) w/Device KIT 1 each by Does not apply route daily. 1 kit 0  . buPROPion (WELLBUTRIN SR) 150 MG 12 hr tablet TAKE 1 TABLET BY MOUTH EVERY DAY 30 tablet 5  . cetirizine (ZYRTEC) 10 MG tablet TAKE 1 TABLET BY MOUTH EVERY DAY 30 tablet 11  . fluticasone (FLONASE) 50 MCG/ACT nasal spray SPRAY 2 SPRAYS INTO  EACH NOSTRIL EVERY DAY 16 mL 6  . glucose blood test strip 1 each by Other route daily. Use as instructed to check blood sugar before meals, 2 hours after meals, and before bed. DX:E11.65 250 each 3  . glucose blood test strip 1 each by Other route as needed for other. Use as instructed to check blood sugar 4 times daily. DX:E11.65 200 each 11  . Insulin Disposable Pump (OMNIPOD DASH 5 PACK PODS) MISC APPLY ONE POD TO BODY EVERY 3 DAYS FOR INSULIN DELIVERY. 30 each 0  . Insulin Pen Needle 31G X 8 MM MISC Use with pen QD 100 each 3  . insulin regular human CONCENTRATED (HUMULIN R) 500 UNIT/ML injection INJECT 15 UNITS SUBCUTANEOUSLY BEFORE BREAKFAST+DINNER+10 UNITS BEFORE LUNCH ADJUST AS DIRECTED. 20 mL 12  .  Insulin Syringe/Needle U-500 31G X 0.5 ML MISC 1 each by Does not apply route daily. Use to inject insulin 3 times daily. 100 each 3  . Lancets Misc. (ACCU-CHEK FASTCLIX LANCET) KIT 1 each by Does not apply route daily. Use to check blood sugar 5 times daily. 1 kit 12  . rosuvastatin (CRESTOR) 10 MG tablet TAKE 1 TABLET BY MOUTH EVERY DAY 90 tablet 3  . Semaglutide,0.25 or 0.5MG /DOS, (OZEMPIC, 0.25 OR 0.5 MG/DOSE,) 2 MG/1.5ML SOPN Inject 0.25 mg into the skin once a week. Inject 0.25mg  under the skin once weekly for 4 weeks and then increase to 0.5mg  weekly. 1.5 mL 1  . TRINTELLIX 10 MG TABS tablet TAKE 1 TABLET BY MOUTH EVERY DAY 30 tablet 5   No current facility-administered medications on file prior to visit.   No Known Allergies Social History   Socioeconomic History  . Marital status: Divorced    Spouse name: Not on file  . Number of children: Not on file  . Years of education: Not on file  . Highest education level: Not on file  Occupational History  . Not on file  Tobacco Use  . Smoking status: Never Smoker  . Smokeless tobacco: Never Used  Substance and Sexual Activity  . Alcohol use: Yes    Alcohol/week: 1.0 standard drink    Types: 1 Glasses of wine per week    Comment: one glass of wine per week  . Drug use: No  . Sexual activity: Yes    Birth control/protection: Surgical  Other Topics Concern  . Not on file  Social History Narrative  . Not on file   Social Determinants of Health   Financial Resource Strain:   . Difficulty of Paying Living Expenses:   Food Insecurity:   . Worried About Programme researcher, broadcasting/film/video in the Last Year:   . Barista in the Last Year:   Transportation Needs:   . Freight forwarder (Medical):   Marland Kitchen Lack of Transportation (Non-Medical):   Physical Activity:   . Days of Exercise per Week:   . Minutes of Exercise per Session:   Stress:   . Feeling of Stress :   Social Connections:   . Frequency of Communication with Friends and  Family:   . Frequency of Social Gatherings with Friends and Family:   . Attends Religious Services:   . Active Member of Clubs or Organizations:   . Attends Banker Meetings:   Marland Kitchen Marital Status:   Intimate Partner Violence:   . Fear of Current or Ex-Partner:   . Emotionally Abused:   Marland Kitchen Physically Abused:   . Sexually Abused:  Review of Systems  All other systems reviewed and are negative.      Objective:   Physical Exam Vitals reviewed.  Constitutional:      General: She is not in acute distress.    Appearance: She is well-developed. She is not diaphoretic.  Eyes:     Conjunctiva/sclera: Conjunctivae normal.  Cardiovascular:     Rate and Rhythm: Normal rate and regular rhythm.     Heart sounds: Normal heart sounds.  Pulmonary:     Effort: Pulmonary effort is normal. No respiratory distress.     Breath sounds: Normal breath sounds. No wheezing or rales.  Musculoskeletal:     Cervical back: Neck supple.  Lymphadenopathy:     Cervical: No cervical adenopathy.           Assessment & Plan:  Current moderate episode of major depressive disorder, unspecified whether recurrent (HCC)  We discussed treatment options in addition to counseling I recommended increasing her Wellbutrin to 300 mg a day.  She can use Xanax however I cautioned the patient that it is habit-forming and can lead to dependency and habituation.  Therefore I recommended that she try to limit the use of this medication in the future.  Recheck via email in 1 month or sooner if worsening.  For the leg swelling she can use Lasix 40 mg as needed.  However I cautioned the patient to use this sparingly as she is already on acetazolamide for pseudotumor cerebra and I do not want to cause dehydration.  Patient will only use it on severe days.  He is seeing an endocrinologist and had lab work in April.  She is scheduled for repeat lab work next month and at that they will check her electrolytes and  cholesterol per her report

## 2020-01-27 ENCOUNTER — Other Ambulatory Visit: Payer: Self-pay

## 2020-01-29 ENCOUNTER — Other Ambulatory Visit: Payer: Self-pay

## 2020-01-29 MED ORDER — ACETAZOLAMIDE 250 MG PO TABS: 250.0000 mg | ORAL_TABLET | Freq: Two times a day (BID) | ORAL | 1 refills | Status: DC

## 2020-02-03 ENCOUNTER — Other Ambulatory Visit: Payer: Self-pay | Admitting: Obstetrics and Gynecology

## 2020-02-03 DIAGNOSIS — R928 Other abnormal and inconclusive findings on diagnostic imaging of breast: Secondary | ICD-10-CM

## 2020-02-06 ENCOUNTER — Other Ambulatory Visit: Payer: Self-pay | Admitting: Family Medicine

## 2020-02-17 ENCOUNTER — Ambulatory Visit: Payer: Medicaid Other

## 2020-02-17 ENCOUNTER — Other Ambulatory Visit: Payer: Self-pay | Admitting: Family Medicine

## 2020-02-17 ENCOUNTER — Ambulatory Visit
Admission: RE | Admit: 2020-02-17 | Discharge: 2020-02-17 | Disposition: A | Discharge status: Home / Self Care | Payer: Medicaid Other | Source: Ambulatory Visit | Attending: Obstetrics and Gynecology | Admitting: Obstetrics and Gynecology

## 2020-02-17 ENCOUNTER — Other Ambulatory Visit: Payer: Self-pay

## 2020-02-17 DIAGNOSIS — R928 Other abnormal and inconclusive findings on diagnostic imaging of breast: Secondary | ICD-10-CM

## 2020-02-24 DIAGNOSIS — E1165 Type 2 diabetes mellitus with hyperglycemia: Secondary | ICD-10-CM | POA: Diagnosis not present

## 2020-03-14 DIAGNOSIS — F411 Generalized anxiety disorder: Secondary | ICD-10-CM | POA: Diagnosis not present

## 2020-03-14 DIAGNOSIS — F5081 Binge eating disorder: Secondary | ICD-10-CM | POA: Diagnosis not present

## 2020-03-14 DIAGNOSIS — F331 Major depressive disorder, recurrent, moderate: Secondary | ICD-10-CM | POA: Diagnosis not present

## 2020-03-22 ENCOUNTER — Other Ambulatory Visit: Payer: Self-pay | Admitting: Family Medicine

## 2020-03-25 DIAGNOSIS — E1165 Type 2 diabetes mellitus with hyperglycemia: Secondary | ICD-10-CM | POA: Diagnosis not present

## 2020-03-29 ENCOUNTER — Other Ambulatory Visit: Payer: Self-pay | Admitting: Family Medicine

## 2020-04-06 ENCOUNTER — Telehealth: Payer: Self-pay | Admitting: Endocrinology
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
## 2020-04-07 MED ORDER — HUMULIN R U-500 (CONCENTRATED) 500 UNIT/ML ~~LOC~~ SOLN: SUBCUTANEOUS | 1 refills | Status: DC

## 2020-04-07 MED ORDER — INSULIN PEN NEEDLE 32G X 5 MM MISC: 0 refills | Status: DC

## 2020-04-07 MED ORDER — "INSULIN SYRINGE-NEEDLE U-100 31G X 5/16"" 0.5 ML MISC": 0 refills | Status: DC

## 2020-04-07 NOTE — Telephone Encounter (Signed)
Kimberly [name] spoke with pt and made her aware of directions. Rx is sent to pharmacy. Pt is suppose to contact diabetic education about her reader.

## 2020-04-07 NOTE — Telephone Encounter (Signed)
She can take the Humulin R U-500 insulin with a regular insulin syringe. She will need to take between 16-20 units before each meal depending on her meal size 3 times a day until she gets the new controller for her insulin pump from the company

## 2020-04-07 NOTE — Addendum Note (Signed)
Addended by: Garfield Cornea L on: 04/07/2020 04:47 PM   Modules accepted: Orders
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
## 2020-04-08 ENCOUNTER — Telehealth: Payer: Self-pay | Admitting: Registered"

## 2020-04-08 ENCOUNTER — Telehealth: Payer: Self-pay
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
## 2020-04-08 NOTE — Telephone Encounter (Signed)
Pharmacy states insurance will not cover insulin until the 17th.  Override requested from medicaid.  Pharmacy and paitent aware.

## 2020-04-13 ENCOUNTER — Other Ambulatory Visit: Payer: Self-pay

## 2020-04-13 ENCOUNTER — Other Ambulatory Visit (INDEPENDENT_AMBULATORY_CARE_PROVIDER_SITE_OTHER): Payer: Medicaid Other

## 2020-04-13 DIAGNOSIS — E782 Mixed hyperlipidemia: Secondary | ICD-10-CM | POA: Diagnosis not present

## 2020-04-13 DIAGNOSIS — Z794 Long term (current) use of insulin: Secondary | ICD-10-CM | POA: Diagnosis not present

## 2020-04-13 DIAGNOSIS — E1165 Type 2 diabetes mellitus with hyperglycemia: Secondary | ICD-10-CM | POA: Diagnosis not present

## 2020-04-13 LAB — COMPREHENSIVE METABOLIC PANEL
ALT: 64 U/L — ABNORMAL HIGH (ref 0–35)
AST: 32 U/L (ref 0–37)
Albumin: 3.8 g/dL (ref 3.5–5.2)
Alkaline Phosphatase: 100 U/L (ref 39–117)
BUN: 13 mg/dL (ref 6–23)
CO2: 21 mEq/L (ref 19–32)
Calcium: 8.5 mg/dL (ref 8.4–10.5)
Chloride: 107 mEq/L (ref 96–112)
Creatinine, Ser: 0.95 mg/dL (ref 0.40–1.20)
GFR: 70.13 mL/min (ref >60.00)
Glucose, Bld: 256 mg/dL — ABNORMAL HIGH (ref 70–99)
Potassium: 3.8 mEq/L (ref 3.5–5.1)
Sodium: 136 mEq/L (ref 135–145)
Total Bilirubin: 0.6 mg/dL (ref 0.2–1.2)
Total Protein: 6.1 g/dL (ref 6.0–8.3)

## 2020-04-13 LAB — MICROALBUMIN / CREATININE URINE RATIO
Creatinine,U: 144.3 mg/dL
Microalb Creat Ratio: 0.7 mg/g (ref 0.0–30.0)
Microalb, Ur: 1.1 mg/dL (ref 0.0–1.9)

## 2020-04-13 LAB — LIPID PANEL
Cholesterol: 197 mg/dL (ref 0–200)
HDL: 29.4 mg/dL — ABNORMAL LOW (ref >39.00)
NonHDL: 167.87
Total CHOL/HDL Ratio: 7
Triglycerides: 269 mg/dL — ABNORMAL HIGH (ref 0.0–149.0)
VLDL: 53.8 mg/dL — ABNORMAL HIGH (ref 0.0–40.0)

## 2020-04-13 LAB — LDL CHOLESTEROL, DIRECT: Direct LDL: 136 mg/dL

## 2020-04-13 LAB — HEMOGLOBIN A1C: Hgb A1c MFr Bld: 8.9 % — ABNORMAL HIGH (ref 4.6–6.5)

## 2020-04-19 ENCOUNTER — Encounter: Payer: Self-pay | Admitting: Endocrinology

## 2020-04-19 ENCOUNTER — Other Ambulatory Visit: Payer: Self-pay

## 2020-04-19 ENCOUNTER — Ambulatory Visit (INDEPENDENT_AMBULATORY_CARE_PROVIDER_SITE_OTHER): Payer: Medicaid Other | Admitting: Endocrinology

## 2020-04-19 VITALS — BP 124/68 | HR 94 | Ht 61.0 in | Wt 269.0 lb

## 2020-04-19 DIAGNOSIS — E1165 Type 2 diabetes mellitus with hyperglycemia: Secondary | ICD-10-CM

## 2020-04-19 DIAGNOSIS — E782 Mixed hyperlipidemia: Secondary | ICD-10-CM | POA: Diagnosis not present

## 2020-04-19 DIAGNOSIS — Z794 Long term (current) use of insulin: Secondary | ICD-10-CM

## 2020-04-19 NOTE — Progress Notes (Signed)
Patient ID: Kimberly [name], female   DOB: March 09, 1971, 49 y.o.   MRN: [id number]          Reason for Appointment: Follow-up for Type 2 Diabetes   History of Present Illness:          Date of diagnosis of type 2 diabetes mellitus: 12/2014   Background history:   She was initially tried on metformin but because of side effects as was stopped and she was tried on glipizide and subsequently [place].  She apparently took Jardiance for at least a couple of months but because of recurrent yeast infection she stopped it and she did not think it was helping her glucose also He was started on Lantus insulin in 12/2015 Subsequently Bydureon was added in 03/2016  Recent history:  Current insulin: Humulin R U-500, 20 units before meals  INSULIN regimen with Omni pod/Dash::  Basal rate:  MN: 0.8, 5 AM = 1.3, 12 PM = 0.9, 5 PM = 1.0.  Total basal insulin 25.6 units I/C ratio 10, target 140 with corrections over 120, ISF: 60, and timing: 6 hours. Boluses 8-12 units with meals   Non-insulin hypoglycemic drugs the patient is taking are: None currently  Most recent A1c is  8.9 compared to 7.7  Fructosamine previously 225  Current management, blo od sugar patterns and problems identified:  She has not been seen in follow-up since 2/21   She is here because she lost her PDM and not able to use her pump for nearly 2 weeks now  She is still monitoring blood sugars somewhat sporadically with her Accu-Chek.  Mostly before meals in the morning and dinnertime  However blood sugars are averaging over 200 in the morning and may be higher midday also  However she thinks her blood sugars were frequently over 200 even when she was using the OMNIPOD  Blood sugars appear to be variable but not usually high before dinnertime  No readings after dinner recently  She is using the U-100 syringe for the insulin currently and is taking her shots about 15-20-minute before eating usually  No hypoglycemia  except when she gets extra insulin at night for blood sugar of 138 with starting the shots  She was told to switch to Ozempic since Bydureon was not covered but not clear whether she is taking both together but recently has been taking 0.5 Ozempic weekly without any nausea, however she does not think this is improving satiety  Not exercising because of a knee meniscus problem        Side effects from medications : Nausea and diarrhea from any dose metformin, recurrent yeast infections from Jardiance  Glucose monitoring:  3-4 times a day       Glucometer:  Accu-Chek  .      Blood Glucose readings from Accu-Chek download   AVERAGE 190 MORNING range recently 163-254 Nonfasting recently 61-268  Previous readings:  PRE-MEAL  9-10 AM Lunch Dinner Bedtime Overall  Glucose range:  69-375  137-252  101-276  55-259   Mean/median:  151    145 +/-74   POST-MEAL PC Breakfast  afternoon PC Dinner  Glucose range:   66-283  47-233  Mean/median:   160  104     Self-care:  Typical meal intake: Breakfast will be either  bagel, scrambled egg/oatmeal, juice.  Sometimes only toast and juice   Lunch may be sandwich or leftovers Dinner is variable, Snacks will be yogurt or chips  Dietician visit, most recent: 08/2017              Weight history:  Wt Readings from Last 3 Encounters:  04/19/20 269 lb (122 kg)  01/26/20 272 lb (123.4 kg)  08/19/19 290 lb 9.6 oz (131.8 kg)    `Glycemic control:   Lab Results  Component Value Date   HGBA1C 8.9 (H) 04/13/2020   HGBA1C 7.7 (H) 06/15/2019   HGBA1C 7.2 (A) 02/18/2019   Lab Results  Component Value Date   MICROALBUR 1.1 04/13/2020   LDLCALC 90 10/21/2018   CREATININE 0.95 04/13/2020   Lab Results  Component Value Date   MICRALBCREAT 0.7 04/13/2020    Lab Results  Component Value Date   FRUCTOSAMINE 225 08/12/2019   FRUCTOSAMINE 218 10/21/2018   FRUCTOSAMINE 312 (H) 03/06/2018      Allergies as of 04/19/2020   No  Known Allergies     Medication List       Accurate as of April 19, 2020  2:40 PM. If you have any questions, ask your nurse or doctor.        Accu-Chek [employer] Kit 1 each by Does not apply route daily. Use to check blood sugar 5 times daily.   Accu-Chek Guide Me w/Device Kit 1 each by Does not apply route daily.   acetaZOLAMIDE 250 MG tablet Commonly known as: DIAMOX Take 1 tablet (250 mg total) by mouth 2 (two) times daily.   ALPRAZolam 1 MG tablet Commonly known as: XANAX TAKE 1 TABLET (1 MG TOTAL) BY MOUTH 3 (THREE) TIMES DAILY AS NEEDED.   atenolol 100 MG tablet Commonly known as: TENORMIN TAKE 1 TABLET BY MOUTH EVERY DAY   buPROPion 300 MG 24 hr tablet Commonly known as: WELLBUTRIN XL TAKE 1 TABLET BY MOUTH EVERY DAY   cetirizine 10 MG tablet Commonly known as: ZYRTEC TAKE 1 TABLET BY MOUTH EVERY DAY   fluticasone 50 MCG/ACT nasal spray Commonly known as: FLONASE SPRAY 2 SPRAYS INTO EACH NOSTRIL EVERY DAY   furosemide 40 MG tablet Commonly known as: LASIX TAKE 1 TABLET (40 MG TOTAL) BY MOUTH DAILY AS NEEDED FOR FLUID.   glucose blood test strip 1 each by Other route daily. Use as instructed to check blood sugar before meals, 2 hours after meals, and before bed. DX:E11.65   glucose blood test strip 1 each by Other route as needed for other. Use as instructed to check blood sugar 4 times daily. DX:E11.65   Accu-Chek Guide test strip Generic drug: glucose blood USE AS INSTRUCTED TO TEST BLOOD SUGAR 5 TIMES DAILY.   HUMULIN R 500 UNIT/ML injection Generic drug: insulin regular human CONCENTRATED take between 16-20 units before each meal depending on meal size 3 times a day   Insulin Pen Needle 32G X 5 MM Misc Use 1-4 times daily as needed or directed   Insulin Syringe-Needle U-100 31G X 5/16" 0.5 ML Misc Commonly known as: BD Insulin Syringe U/F Use with meal three times a day   OmniPod Dash 5 Pack Pods Misc APPLY ONE POD TO BODY EVERY 3  DAYS FOR INSULIN DELIVERY.   Ozempic (0.25 or 0.5 MG/DOSE) 2 MG/1.5ML Sopn Generic drug: Semaglutide(0.25 or 0.5MG /DOS) Inject 0.25 mg into the skin once a week. Inject 0.25mg  under the skin once weekly for 4 weeks and then increase to 0.5mg  weekly. What changed: how much to take   rosuvastatin 10 MG tablet Commonly known as: CRESTOR TAKE 1 TABLET BY MOUTH EVERY DAY  Allergies: No Known Allergies  Past Medical History:  Diagnosis Date   Abnormal vaginal Pap smear    tx with cryotherapy   Anxiety    Depression    Diabetes mellitus without complication (HCC)    History of kidney stones    passed stone - no surgery   Hypertension    Migraine    otc med prn   Obesity    Pseudotumor cerebri    SVD (spontaneous vaginal delivery)    x 3   Tension headache     Past Surgical History:  Procedure Laterality Date   ABDOMINAL HYSTERECTOMY     ANTERIOR CERVICAL DECOMP/DISCECTOMY FUSION N/A 01/03/2016   Procedure: ANTERIOR CERVICAL DECOMPRESSION FUSION CERVICAL FIVE-SIX,CERVICAL SIX-SEVEN.;  Surgeon: Lisbeth Renshaw, MD;  Location: [facility];  Service: Neurosurgery;  Laterality: N/A;  right side approach   CRYOTHERAPY     LAPAROSCOPIC ASSISTED VAGINAL HYSTERECTOMY Bilateral 04/21/2014   Procedure: LAPAROSCOPIC ASSISTED VAGINAL HYSTERECTOMY, BILATERAL SALPINGECTOMY;  Surgeon: Lavina Hamman, MD;  Location: [facility];  Service: Gynecology;  Laterality: Bilateral;   MANDIBLE FRACTURE SURGERY  2002   x 1   TUBAL LIGATION  11/2003   WISDOM TOOTH EXTRACTION      Family History  Problem Relation Age of Onset   Hypertension Mother    Diabetes Mother    Breast cancer Mother    Diabetes Sister    Hypertension Sister     Social History:  reports that she has never smoked. She has never used smokeless tobacco. She reports current alcohol use of about 1.0 standard drink of alcohol per week. She reports that she does not use drugs.   Review of  Systems   Lipid history: Taking Crestor for her hyperlipidemia  LDL which was below 100 in the past and triglycerides 149 are now both above target She has not had a refill recently and LDL is higher     Lab Results  Component Value Date   CHOL 197 04/13/2020   CHOL 225 (H) 08/12/2019   CHOL 154 10/21/2018   Lab Results  Component Value Date   HDL 29.40 (L) 04/13/2020   HDL 31.40 (L) 08/12/2019   HDL 33.90 (L) 10/21/2018   Lab Results  Component Value Date   LDLCALC 90 10/21/2018   LDLCALC 250 (H) 10/28/2015   LDLCALC 110 (H) 12/31/2012   Lab Results  Component Value Date   TRIG 269.0 (H) 04/13/2020   TRIG 272.0 (H) 08/12/2019   TRIG 149.0 10/21/2018   Lab Results  Component Value Date   CHOLHDL 7 04/13/2020   CHOLHDL 7 08/12/2019   CHOLHDL 5 10/21/2018   Lab Results  Component Value Date   LDLDIRECT 136.0 04/13/2020   LDLDIRECT 159.0 08/12/2019   LDLDIRECT 105.0 05/20/2018        Liver functions have been variable, mildly increased now     Lab Results  Component Value Date   ALT 64 (H) 04/13/2020   Hypertension: This is being treated with atenolol 100 mg by her PCP  Not on ACE inhibitor  BP Readings from Last 3 Encounters:  04/19/20 124/68  01/26/20 108/80  08/19/19 122/74    Most recent foot exam:2/19  She is on Trintellix and bupropion for depression   LABS:  Lab on 04/13/2020  Component Date Value Ref Range Status   Cholesterol 04/13/2020 197  0 - 200 mg/dL Final   ATP III Classification       Desirable:  < 200 mg/dL  Borderline High:  200 - 239 mg/dL          High:  > = 469 mg/dL   Triglycerides 62/95/2841 269.0* 0 - 149 mg/dL Final   Normal:  <324 mg/dLBorderline High:  150 - 199 mg/dL   HDL 40/03/2724 36.64* >39.00 mg/dL Final   VLDL 40/34/7425 53.8* 0.0 - 40.0 mg/dL Final   Total CHOL/HDL Ratio 04/13/2020 7   Final                  Men          Women1/2 Average Risk     3.4          3.3Average Risk          5.0           4.42X Average Risk          9.6          7.13X Average Risk          15.0          11.0                       NonHDL 04/13/2020 167.87   Final   NOTE:  Non-HDL goal should be 30 mg/dL higher than patient's LDL goal (i.e. LDL goal of < 70 mg/dL, would have non-HDL goal of < 100 mg/dL)   Microalb, Ur 95/63/8756 1.1  0.0 - 1.9 mg/dL Final   Creatinine,U 43/32/9518 144.3  mg/dL Final   Microalb Creat Ratio 04/13/2020 0.7  0.0 - 30.0 mg/g Final   Sodium 04/13/2020 136  135 - 145 mEq/L Final   Potassium 04/13/2020 3.8  3.5 - 5.1 mEq/L Final   Chloride 04/13/2020 107  96 - 112 mEq/L Final   CO2 04/13/2020 21  19 - 32 mEq/L Final   Glucose, Bld 04/13/2020 256* 70 - 99 mg/dL Final   BUN 84/16/6063 13  6 - 23 mg/dL Final   Creatinine, Ser 04/13/2020 0.95  0.40 - 1.20 mg/dL Final   Total Bilirubin 04/13/2020 0.6  0.2 - 1.2 mg/dL Final   Alkaline Phosphatase 04/13/2020 100  39 - 117 U/L Final   AST 04/13/2020 32  0 - 37 U/L Final   ALT 04/13/2020 64* 0 - 35 U/L Final   Total Protein 04/13/2020 6.1  6.0 - 8.3 g/dL Final   Albumin 01/60/1093 3.8  3.5 - 5.2 g/dL Final   GFR 23/55/7322 70.13  >60.00 mL/min Final   Calcium 04/13/2020 8.5  8.4 - 10.5 mg/dL Final   Hgb G2R MFr Bld 04/13/2020 8.9* 4.6 - 6.5 % Final   Glycemic Control Guidelines for People with Diabetes:Non Diabetic:  <6%Goal of Therapy: <7%Additional Action Suggested:  >8%    Direct LDL 04/13/2020 136.0  mg/dL Final   Optimal:  <427 mg/dLNear or Above Optimal:  100-129 mg/dLBorderline High:  130-159 mg/dLHigh:  160-189 mg/dLVery High:  >190 mg/dL    Physical Examination:  BP 124/68    Pulse 94    Ht 5\' 1"  (1.549 m)    Wt 269 lb (122 kg)    LMP 09/23/2013    SpO2 93%    BMI 50.83 kg/m        ASSESSMENT:  Diabetes type 2, with morbid obesity     Her A1c   However fructosamine is 225 indicating recent improvement  Currently on a regimen of insulin pump using U-500 insulin  She has not been seen  regularly for  follow-up Not clear why her blood sugars are out of control even with taking Ozempic regularly recently Likely has not been watching her diet and may not have been consistent with pump management of boluses before but no data available  Currently with using U-500 insulin injections because of her PDM been lost is not getting good control Also likely needs higher doses of insulin in the evening possibly bedtime dose since morning sugars are the highest Monitoring is inadequate and no readings after dinner She also has questions about timing and adjustment of the injections  Hypertension: Controlled but not on ACE inhibitor/ARB, consider prescribing losartan and reducing atenolol                             LIPIDS: Not controlled partly from poor diabetes control, more snacks and also irregular refills on her rosuvastatin which she needs to take daily     PLAN:    She will try to get the PDM to get back on her insulin pump Will not change her settings unless patterns are abnormal on the next visit Discussed timing of injections or boluses 30-minute before eating She needs to cut back on snacks and portions as well as let us know if she had low sugars Discussed use of the CGM with at least freestyle libre version 2 which will help her with control and alert her for high or low readings  NEW insulin doses will be 24--14--20 and 10 units at bedtime Given instructions on how to adjust bedtime dose to get morning sugars down Increase Ozempic to 1 mg weekly  Take Crestor regularly  Patient Instructions  Insulin 30 min before meal  Take 24 in am , 14 at lunch, 20 before supper and 10 at bedtime  If am sugar > 140 then go up 2 Units every 3 days until am sugar down  Check blood sugars on waking up 4-5 days a week  Also check blood sugars about 2 hours after meals and do this after different meals by rotation  Recommended blood sugar levels on waking up are 90-130 and  about 2 hours after meal is 130-180  Please bring your blood sugar monitor to each visit, thank you  Take 2 shots at a time of 0.5 Ozempic weekly            Reather Littler 04/19/2020, 2:40 PM   Note: This office note was prepared with Dragon voice recognition system technology. Any transcriptional errors that result from this process are unintentional.

## 2020-04-19 NOTE — Patient Instructions (Addendum)
Insulin 30 min before meal  Take 24 in am , 14 at lunch, 20 before supper and 10 at bedtime  If am sugar > 140 then go up 2 Units every 3 days until am sugar down  Check blood sugars on waking up 4-5 days a week  Also check blood sugars about 2 hours after meals and do this after different meals by rotation  Recommended blood sugar levels on waking up are 90-130 and about 2 hours after meal is 130-180  Please bring your blood sugar monitor to each visit, thank you  Take 2 shots at a time of 0.5 Ozempic weekly

## 2020-04-20 ENCOUNTER — Other Ambulatory Visit: Payer: Self-pay | Admitting: Family Medicine

## 2020-04-21 ENCOUNTER — Encounter (HOSPITAL_COMMUNITY): Payer: Self-pay | Admitting: Emergency Medicine

## 2020-04-21 ENCOUNTER — Emergency Department (HOSPITAL_COMMUNITY)
Admission: EM | Admit: 2020-04-21 | Discharge: 2020-04-21 | Disposition: A | Discharge status: Home / Self Care | Payer: Medicaid Other | Attending: Emergency Medicine | Admitting: Emergency Medicine

## 2020-04-21 ENCOUNTER — Emergency Department (HOSPITAL_COMMUNITY): Payer: Medicaid Other

## 2020-04-21 ENCOUNTER — Other Ambulatory Visit: Payer: Self-pay

## 2020-04-21 DIAGNOSIS — K5900 Constipation, unspecified: Secondary | ICD-10-CM | POA: Diagnosis not present

## 2020-04-21 DIAGNOSIS — Z794 Long term (current) use of insulin: Secondary | ICD-10-CM | POA: Insufficient documentation

## 2020-04-21 DIAGNOSIS — R1013 Epigastric pain: Secondary | ICD-10-CM | POA: Insufficient documentation

## 2020-04-21 DIAGNOSIS — E119 Type 2 diabetes mellitus without complications: Secondary | ICD-10-CM | POA: Insufficient documentation

## 2020-04-21 DIAGNOSIS — Z79899 Other long term (current) drug therapy: Secondary | ICD-10-CM | POA: Diagnosis not present

## 2020-04-21 DIAGNOSIS — I10 Essential (primary) hypertension: Secondary | ICD-10-CM | POA: Insufficient documentation

## 2020-04-21 DIAGNOSIS — R112 Nausea with vomiting, unspecified: Secondary | ICD-10-CM | POA: Insufficient documentation

## 2020-04-21 DIAGNOSIS — R109 Unspecified abdominal pain: Secondary | ICD-10-CM

## 2020-04-21 LAB — CBC
HCT: 50.7 % — ABNORMAL HIGH (ref 36.0–46.0)
Hemoglobin: 16.2 g/dL — ABNORMAL HIGH (ref 12.0–15.0)
MCH: 28 pg (ref 26.0–34.0)
MCHC: 32 g/dL (ref 30.0–36.0)
MCV: 87.7 fL (ref 80.0–100.0)
Platelets: 260 10*3/uL (ref 150–400)
RBC: 5.78 MIL/uL — ABNORMAL HIGH (ref 3.87–5.11)
RDW: 13.4 % (ref 11.5–15.5)
WBC: 8.7 10*3/uL (ref 4.0–10.5)
nRBC: 0 % (ref 0.0–0.2)

## 2020-04-21 LAB — COMPREHENSIVE METABOLIC PANEL
ALT: 57 U/L — ABNORMAL HIGH (ref 0–44)
AST: 47 U/L — ABNORMAL HIGH (ref 15–41)
Albumin: 4.3 g/dL (ref 3.5–5.0)
Alkaline Phosphatase: 115 U/L (ref 38–126)
Anion gap: 13 (ref 5–15)
BUN: 12 mg/dL (ref 6–20)
CO2: 19 mmol/L — ABNORMAL LOW (ref 22–32)
Calcium: 9.4 mg/dL (ref 8.9–10.3)
Chloride: 107 mmol/L (ref 98–111)
Creatinine, Ser: 1.11 mg/dL — ABNORMAL HIGH (ref 0.44–1.00)
GFR, Estimated: >60 mL/min (ref >60)
Glucose, Bld: 218 mg/dL — ABNORMAL HIGH (ref 70–99)
Potassium: 4 mmol/L (ref 3.5–5.1)
Sodium: 139 mmol/L (ref 135–145)
Total Bilirubin: 1.3 mg/dL — ABNORMAL HIGH (ref 0.3–1.2)
Total Protein: 7.7 g/dL (ref 6.5–8.1)

## 2020-04-21 LAB — LIPASE, BLOOD: Lipase: 28 U/L (ref 11–51)

## 2020-04-21 LAB — I-STAT BETA HCG BLOOD, ED (MC, WL, AP ONLY): I-stat hCG, quantitative: <5 m[IU]/mL (ref <5)

## 2020-04-21 MED ORDER — SODIUM CHLORIDE 0.9 % IV BOLUS
1000.0000 mL | Freq: Once | INTRAVENOUS | Status: AC
  Administered 2020-04-21: 1000 mL via INTRAVENOUS

## 2020-04-21 MED ORDER — PANTOPRAZOLE SODIUM 20 MG PO TBEC: 20.0000 mg | DELAYED_RELEASE_TABLET | Freq: Every day | ORAL | 0 refills | Status: DC

## 2020-04-21 MED ORDER — ONDANSETRON HCL 4 MG PO TABS: 4.0000 mg | ORAL_TABLET | Freq: Three times a day (TID) | ORAL | 0 refills | Status: DC | PRN

## 2020-04-21 MED ORDER — IOHEXOL 300 MG/ML  SOLN
100.0000 mL | Freq: Once | INTRAMUSCULAR | Status: AC | PRN
  Administered 2020-04-21: 100 mL via INTRAVENOUS

## 2020-04-21 MED ORDER — ONDANSETRON HCL 4 MG/2ML IJ SOLN
4.0000 mg | Freq: Once | INTRAMUSCULAR | Status: AC
  Administered 2020-04-21: 4 mg via INTRAVENOUS
  Filled 2020-04-21: qty 2

## 2020-04-21 MED ORDER — SUCRALFATE 1 G PO TABS: 1.0000 g | ORAL_TABLET | Freq: Three times a day (TID) | ORAL | 0 refills | Status: DC

## 2020-04-21 MED ORDER — PANTOPRAZOLE SODIUM 40 MG IV SOLR
40.0000 mg | Freq: Once | INTRAVENOUS | Status: AC
  Administered 2020-04-21: 40 mg via INTRAVENOUS
  Filled 2020-04-21: qty 40

## 2020-04-21 NOTE — ED Provider Notes (Signed)
[facility] EMERGENCY DEPARTMENT Provider Note   CSN: [id number] Arrival date & time: 04/21/20  1020     History Chief Complaint  Patient presents with   Abdominal Pain    Kimberly [name] is a 49 y.o. female.  The history is provided by the patient.  Abdominal Pain Pain location:  Epigastric Pain quality: aching   Pain radiates to:  Does not radiate Pain severity:  Mild Onset quality:  Gradual Duration:  2 weeks Timing:  Intermittent Progression:  Waxing and waning Chronicity:  New Context: previous surgery   Relieved by:  Nothing Worsened by:  Nothing Associated symptoms: constipation, nausea and vomiting   Associated symptoms: no chest pain, no chills, no cough, no dysuria, no fever, no hematemesis, no hematochezia, no hematuria, no melena, no shortness of breath, no sore throat, no vaginal bleeding and no vaginal discharge   Risk factors: multiple surgeries        Past Medical History:  Diagnosis Date   Abnormal vaginal Pap smear    tx with cryotherapy   Anxiety    Depression    Diabetes mellitus without complication (HCC)    History of kidney stones    passed stone - no surgery   Hypertension    Migraine    otc med prn   Obesity    Pseudotumor cerebri    SVD (spontaneous vaginal delivery)    x 3   Tension headache     Patient Active Problem List   Diagnosis Date Noted   Diabetes (Cataract) 04/14/2018   Cervical spondylosis with radiculopathy 01/03/2016   Abnormal uterine bleeding unrelated to menstrual cycle 04/21/2014   Anxiety    Depression    Pseudotumor cerebri    Hypertension    Obesity     Past Surgical History:  Procedure Laterality Date   ABDOMINAL HYSTERECTOMY     ANTERIOR CERVICAL DECOMP/DISCECTOMY FUSION N/A 01/03/2016   Procedure: ANTERIOR CERVICAL DECOMPRESSION FUSION CERVICAL FIVE-SIX,CERVICAL SIX-SEVEN.;  Surgeon: Consuella Lose, MD;  Location: [facility];  Service: Neurosurgery;   Laterality: N/A;  right side approach   CRYOTHERAPY     LAPAROSCOPIC ASSISTED VAGINAL HYSTERECTOMY Bilateral 04/21/2014   Procedure: LAPAROSCOPIC ASSISTED VAGINAL HYSTERECTOMY, BILATERAL SALPINGECTOMY;  Surgeon: Cheri Fowler, MD;  Location: [facility];  Service: Gynecology;  Laterality: Bilateral;   MANDIBLE FRACTURE SURGERY  2002   x 1   TUBAL LIGATION  11/2003   WISDOM TOOTH EXTRACTION       OB History    Gravida  3   Para  3   Term  1   Preterm  2   AB      Living  3     SAB      TAB      Ectopic      Multiple      Live Births              Family History  Problem Relation Age of Onset   Hypertension Mother    Diabetes Mother    Breast cancer Mother    Diabetes Sister    Hypertension Sister     Social History   Tobacco Use   Smoking status: Never Smoker   Smokeless tobacco: Never Used  Substance Use Topics   Alcohol use: Yes    Alcohol/week: 1.0 standard drink    Types: 1 Glasses of wine per week    Comment: one glass of wine per week   Drug use: No  Home Medications Prior to Admission medications   Medication Sig Start Date End Date Taking? Authorizing Provider  acetaminophen (TYLENOL) 325 MG tablet Take 650 mg by mouth every 6 (six) hours as needed for mild pain or headache.   Yes [provider]  acetaZOLAMIDE (DIAMOX) 250 MG tablet Take 1 tablet (250 mg total) by mouth 2 (two) times daily. 01/29/20  Yes Susy Frizzle, MD  ALPRAZolam Duanne Moron) 1 MG tablet TAKE 1 TABLET (1 MG TOTAL) BY MOUTH 3 (THREE) TIMES DAILY AS NEEDED. Patient taking differently: Take 1 mg by mouth 3 (three) times daily as needed for anxiety.  04/21/20  Yes Susy Frizzle, MD  atenolol (TENORMIN) 100 MG tablet TAKE 1 TABLET BY MOUTH EVERY DAY Patient taking differently: Take 100 mg by mouth daily.  02/10/20  Yes Susy Frizzle, MD  buPROPion (WELLBUTRIN XL) 300 MG 24 hr tablet TAKE 1 TABLET BY MOUTH EVERY DAY Patient taking differently: Take  300 mg by mouth daily.  02/25/20  Yes Susy Frizzle, MD  cetirizine (ZYRTEC) 10 MG tablet TAKE 1 TABLET BY MOUTH EVERY DAY Patient taking differently: Take 10 mg by mouth at bedtime.  09/30/19  Yes Susy Frizzle, MD  cloNIDine (CATAPRES) 0.1 MG tablet Take 0.1 mg by mouth daily as needed for anxiety.   Yes [provider]  desvenlafaxine (PRISTIQ) 100 MG 24 hr tablet Take 100 mg by mouth every morning. 03/21/20  Yes [provider]  fluticasone (FLONASE) 50 MCG/ACT nasal spray SPRAY 2 SPRAYS INTO EACH NOSTRIL EVERY DAY Patient taking differently: Place 2 sprays into both nostrils every other day.  03/11/19  Yes Susy Frizzle, MD  furosemide (LASIX) 40 MG tablet TAKE 1 TABLET (40 MG TOTAL) BY MOUTH DAILY AS NEEDED FOR FLUID. 03/29/20  Yes Susy Frizzle, MD  insulin regular human CONCENTRATED (HUMULIN R) 500 UNIT/ML injection take between 16-20 units before each meal depending on meal size 3 times a day Patient taking differently: Inject 20 Units into the skin 3 (three) times daily with meals.  04/07/20  Yes Elayne Snare, MD  rosuvastatin (CRESTOR) 10 MG tablet TAKE 1 TABLET BY MOUTH EVERY DAY Patient taking differently: Take 10 mg by mouth daily.  03/17/19  Yes Elayne Snare, MD  Semaglutide,0.25 or 0.5MG/DOS, (OZEMPIC, 0.25 OR 0.5 MG/DOSE,) 2 MG/1.5ML SOPN Inject 0.25 mg into the skin once a week. Inject 0.68m under the skin once weekly for 4 weeks and then increase to 0.567mweekly. Patient taking differently: Inject 0.5 mg into the skin every Monday.  09/08/19  Yes KuElayne SnareMD  ACCU-CHEK GUIDE test strip USE AS INSTRUCTED TO TEST BLOOD SUGAR 5 TIMES DAILY. 09/12/18   KuElayne SnareMD  Blood Glucose Monitoring Suppl (ACCU-CHEK GUIDE ME) w/Device KIT 1 each by Does not apply route daily. 10/10/17   KuElayne SnareMD  glucose blood test strip 1 each by Other route daily. Use as instructed to check blood sugar before meals, 2 hours after meals, and before bed. DX:E11.65 06/30/18    KuElayne SnareMD  glucose blood test strip 1 each by Other route as needed for other. Use as instructed to check blood sugar 4 times daily. DX:E11.65 07/01/18   KuElayne SnareMD  Insulin Disposable Pump (OMNIPOD DASH 5 PACK PODS) MISC APPLY ONE POD TO BODY EVERY 3 DAYS FOR INSULIN DELIVERY. 11/30/19   KuElayne SnareMD  Insulin Pen Needle 32G X 5 MM MISC Use 1-4 times daily as needed or directed 04/07/20  Elayne Snare, MD  Insulin Syringe-Needle U-100 (BD INSULIN SYRINGE U/F) 31G X 5/16" 0.5 ML MISC Use with meal three times a day 04/07/20   Elayne Snare, MD  Lancets Misc. (ACCU-CHEK FASTCLIX LANCET) KIT 1 each by Does not apply route daily. Use to check blood sugar 5 times daily. 10/10/17   Elayne Snare, MD  ondansetron (ZOFRAN) 4 MG tablet Take 1 tablet (4 mg total) by mouth every 8 (eight) hours as needed for up to 20 doses for nausea or vomiting. 04/21/20   Aceton Kinnear, DO  pantoprazole (PROTONIX) 20 MG tablet Take 1 tablet (20 mg total) by mouth daily. 04/21/20 05/21/20  Yasheka Fossett, DO  sucralfate (CARAFATE) 1 g tablet Take 1 tablet (1 g total) by mouth 4 (four) times daily -  with meals and at bedtime for 14 doses. 04/21/20 04/25/20  Lennice Sites, DO    Allergies    Patient has no known allergies.  Review of Systems   Review of Systems  Constitutional: Negative for chills and fever.  HENT: Negative for ear pain and sore throat.   Eyes: Negative for pain and visual disturbance.  Respiratory: Negative for cough and shortness of breath.   Cardiovascular: Negative for chest pain and palpitations.  Gastrointestinal: Positive for abdominal pain, constipation, nausea and vomiting. Negative for hematemesis, hematochezia and melena.  Genitourinary: Negative for dysuria, hematuria, vaginal bleeding and vaginal discharge.  Musculoskeletal: Negative for arthralgias and back pain.  Skin: Negative for color change and rash.  Neurological: Negative for seizures and syncope.  All other systems reviewed  and are negative.   Physical Exam Updated Vital Signs  ED Triage Vitals  Enc Vitals Group     BP 04/21/20 1025 (!) 154/94     Pulse Rate 04/21/20 1025 (!) 113     Resp 04/21/20 1025 18     Temp 04/21/20 1025 98.3 F (36.8 C)     Temp Source 04/21/20 1025 Oral     SpO2 04/21/20 1025 97 %     Weight 04/21/20 1025 267 lb (121.1 kg)     Height 04/21/20 1025 _0  (1.549 m)     Head Circumference --      Peak Flow --      Pain Score 04/21/20 1029 7     Pain Loc --      Pain Edu? --      Excl. in [place]? --     Physical Exam Vitals and nursing note reviewed.  Constitutional:      General: She is not in acute distress.    Appearance: She is well-developed. She is not ill-appearing.  HENT:     Head: Normocephalic and atraumatic.     Mouth/Throat:     Mouth: Mucous membranes are moist.  Eyes:     Extraocular Movements: Extraocular movements intact.     Conjunctiva/sclera: Conjunctivae normal.  Cardiovascular:     Rate and Rhythm: Normal rate and regular rhythm.     Heart sounds: Normal heart sounds. No murmur heard.   Pulmonary:     Effort: Pulmonary effort is normal. No respiratory distress.     Breath sounds: Normal breath sounds.  Abdominal:     Palpations: Abdomen is soft.     Tenderness: There is abdominal tenderness in the epigastric area. There is no right CVA tenderness, left CVA tenderness, guarding or rebound. Negative signs include Murphy's sign and Rovsing's sign.  Musculoskeletal:     Cervical back: Neck supple.  Skin:  General: Skin is warm and dry.     Capillary Refill: Capillary refill takes less than 2 seconds.  Neurological:     General: No focal deficit present.     Mental Status: She is alert.  Psychiatric:        Mood and Affect: Mood normal.     ED Results / Procedures / Treatments   Labs (all labs ordered are listed, but only abnormal results are displayed) Labs Reviewed  COMPREHENSIVE METABOLIC PANEL - Abnormal; Notable for the following  components:      Result Value   CO2 19 (*)    Glucose, Bld 218 (*)    Creatinine, Ser 1.11 (*)    AST 47 (*)    ALT 57 (*)    Total Bilirubin 1.3 (*)    All other components within normal limits  CBC - Abnormal; Notable for the following components:   RBC 5.78 (*)    Hemoglobin 16.2 (*)    HCT 50.7 (*)    All other components within normal limits  LIPASE, BLOOD  I-STAT BETA HCG BLOOD, ED ([facility], [facility], [facility] ONLY)    EKG None  Radiology CT ABDOMEN PELVIS W CONTRAST  Result Date: 04/21/2020 CLINICAL DATA:  Epigastric abdominal pain. EXAM: CT ABDOMEN AND PELVIS WITH CONTRAST TECHNIQUE: Multidetector CT imaging of the abdomen and pelvis was performed using the standard protocol following bolus administration of intravenous contrast. CONTRAST:  193m OMNIPAQUE IOHEXOL 300 MG/ML  SOLN COMPARISON:  June 28, 2006. FINDINGS: Lower chest: No acute abnormality. Hepatobiliary: No focal liver abnormality is seen. No gallstones, gallbladder wall thickening, or biliary dilatation. Pancreas: Unremarkable. No pancreatic ductal dilatation or surrounding inflammatory changes. Spleen: Normal in size without focal abnormality. Adrenals/Urinary Tract: Adrenal glands appear normal. Nonobstructive left nephrolithiasis is noted. Small left renal cyst is noted. No hydronephrosis or renal obstruction is noted. Urinary bladder is unremarkable. Stomach/Bowel: Stomach is within normal limits. Appendix appears normal. No evidence of bowel wall thickening, distention, or inflammatory changes. Vascular/Lymphatic: No significant vascular findings are present. No enlarged abdominal or pelvic lymph nodes. Reproductive: Status post hysterectomy. No adnexal masses. Other: No abdominal wall hernia or abnormality. No abdominopelvic ascites. Musculoskeletal: No acute or significant osseous findings. IMPRESSION: 1. Nonobstructive left nephrolithiasis. No hydronephrosis or renal obstruction is noted. 2. No other abnormality seen in the  abdomen or pelvis. Electronically Signed   By: JMarijo ConceptionM.D.   On: 04/21/2020 13:27    Procedures Procedures (including critical care time)  Medications Ordered in ED Medications  sodium chloride 0.9 % bolus 1,000 mL (1,000 mLs Intravenous New Bag/Given 04/21/20 1342)  ondansetron (ZOFRAN) injection 4 mg (4 mg Intravenous Given 04/21/20 1342)  pantoprazole (PROTONIX) injection 40 mg (40 mg Intravenous Given 04/21/20 1342)  iohexol (OMNIPAQUE) 300 MG/ML solution 100 mL (100 mLs Intravenous Contrast Given 04/21/20 1307)    ED Course  I have reviewed the triage vital signs and the nursing notes.  Pertinent labs & imaging results that were available during my care of the patient were reviewed by me and considered in my medical decision making (see chart for details).    MDM Rules/Calculators/A&P                          DDASHONNA CHAGNONis a 49year old female with history of anxiety, depression, diabetes who presents the ED with abdominal pain. Unremarkable vitals. No fever. Has had intermittent upper abdominal pain mostly in the epigastric region for  the last 2 weeks. Now having some nausea and vomiting. Felt like she had some dark looking material that she threw up. She is not on blood thinners. No melena. However has not had a bowel movement in several days. Has had abdominal surgery in the past. She is not passing gas. Overall she appears well but does have discomfort in the epigastric region. Could be reflux/ulcers/gastritis type pain. Possible small bowel obstruction. Possibly constipation related. Will get lab work including CT scan abdomen pelvis to further evaluate.  Lab work is relatively unremarkable. Creatinine 1.1. Blood sugar slightly elevated however anion gap is normal. Hemoglobin is 16. Lipase normal doubt pancreatitis. Liver enzymes mildly elevated likely in the setting of some dehydration. Will give IV fluids and awaiting CT scan abdomen pelvis. Patient given IV  Protonix, IV Zofran.  Patient feeling better after GI cocktail.  CT scan overall unremarkable.  Suspect may be some element of gastroparesis versus gastritis/peptic ulcer disease.  Will start Carafate, Protonix.  We will have her follow-up with GI.  She understands return precautions.  Discharged from ED in good condition.  This chart was dictated using voice recognition software.  Despite best efforts to proofread,  errors can occur which can change the documentation meaning.    Final Clinical Impression(s) / ED Diagnoses Final diagnoses:  Abdominal pain, unspecified abdominal location    Rx / DC Orders ED Discharge Orders         Ordered    sucralfate (CARAFATE) 1 g tablet  3 times daily with meals & bedtime        04/21/20 1515    pantoprazole (PROTONIX) 20 MG tablet  Daily        04/21/20 1515    ondansetron (ZOFRAN) 4 MG tablet  Every 8 hours PRN        04/21/20 1515           Swathi Dauphin, DO 04/21/20 1516

## 2020-04-21 NOTE — ED Notes (Signed)
Patient verbalizes understanding of discharge instructions. Opportunity for questioning and answers were provided. Armband removed by staff, pt discharged from ED ambulatory.   

## 2020-04-21 NOTE — ED Triage Notes (Signed)
Pt states she has not had a bowel movement in 12 days. Pt has had dark colored emesis. Pt complains of left lower abd pain that has increased over the past week.

## 2020-04-21 NOTE — ED Notes (Signed)
Pt given graham crackers and water per verbal doctor orders to see how well she tolerates.

## 2020-04-22 ENCOUNTER — Ambulatory Visit (INDEPENDENT_AMBULATORY_CARE_PROVIDER_SITE_OTHER): Payer: Medicaid Other | Admitting: Family Medicine

## 2020-04-22 ENCOUNTER — Telehealth: Payer: Self-pay

## 2020-04-22 VITALS — BP 120/90 | HR 88 | Temp 97.3°F | Ht 61.0 in | Wt 265.0 lb

## 2020-04-22 DIAGNOSIS — K5900 Constipation, unspecified: Secondary | ICD-10-CM

## 2020-04-22 DIAGNOSIS — R1012 Left upper quadrant pain: Secondary | ICD-10-CM | POA: Diagnosis not present

## 2020-04-22 DIAGNOSIS — R112 Nausea with vomiting, unspecified: Secondary | ICD-10-CM

## 2020-04-22 MED ORDER — METOCLOPRAMIDE HCL 10 MG PO TABS: 10.0000 mg | ORAL_TABLET | Freq: Four times a day (QID) | ORAL | 0 refills | Status: DC

## 2020-04-22 NOTE — Telephone Encounter (Signed)
Transition Care Management Follow-up Telephone Call  Date of discharge and from where: 04/21/2020 from [facility]  How have you been since you were released from the hospital? Patient states that the pain is a bit worse.   Any questions or concerns? No  Items Reviewed:  Did the pt receive and understand the discharge instructions provided? Yes   Medications obtained and verified? Yes   Other? No   Any new allergies since your discharge? No   Dietary orders reviewed? YES  Do you have support at home? Yes   Functional Questionnaire: (I = Independent and D = Dependent) ADLs: I  Bathing/Dressing- I  Meal Prep- I  Eating- I  Maintaining continence- I  Transferring/Ambulation- I  Managing Meds- I   Follow up appointments reviewed:   PCP Hospital f/u appt confirmed? Yes  Scheduled to see Kimberly [name] on 04/22/2020 @ 12:30pm.  Are transportation arrangements needed? No   If their condition worsens, is the pt aware to call PCP or go to the Emergency Dept.? YES  Was the patient provided with contact information for the PCP's office or ED? YES  Was to pt encouraged to call back with questions or concerns? YES

## 2020-04-22 NOTE — Progress Notes (Signed)
Subjective:    Patient ID: Kimberly [name], female    DOB: 07-28-1970, 49 y.o.   MRN: [id number]  Was seen in the emergency room yesterday for abdominal pain.  Reviewed the emergency room physician's note.  Lab work included a CBC that showed a normal white blood cell count, slightly elevated hemoglobin and hematocrit likely due to hemoconcentration as well as a CMP that showed mild elevation in liver function test.  CBC was unremarkable and showed nonobstructive left-sided nephrolithiasis but otherwise was normal.  ER physician treated the patient for possible gastritis with sucralfate and Protonix.  She is here today for follow-up.  Also on his differential diagnosis was gastroparesis. No visits with results within 1 Day(s) from this visit.  Latest known visit with results is:  Admission on 04/21/2020, Discharged on 04/21/2020  Component Date Value Ref Range Status  . Lipase 04/21/2020 28  11 - 51 U/L Final   Performed at [facility], [address], [place], [place] [address]  . Sodium 04/21/2020 139  135 - 145 mmol/L Final  . Potassium 04/21/2020 4.0  3.5 - 5.1 mmol/L Final  . Chloride 04/21/2020 107  98 - 111 mmol/L Final  . CO2 04/21/2020 19* 22 - 32 mmol/L Final  . Glucose, Bld 04/21/2020 218* 70 - 99 mg/dL Final   Glucose reference range applies only to samples taken after fasting for at least 8 hours.  . BUN 04/21/2020 12  6 - 20 mg/dL Final  . Creatinine, Ser 04/21/2020 1.11* 0.44 - 1.00 mg/dL Final  . Calcium 25/36/6440 9.4  8.9 - 10.3 mg/dL Final  . Total Protein 04/21/2020 7.7  6.5 - 8.1 g/dL Final  . Albumin 34/74/2595 4.3  3.5 - 5.0 g/dL Final  . AST 63/87/5643 47* 15 - 41 U/L Final  . ALT 04/21/2020 57* 0 - 44 U/L Final  . Alkaline Phosphatase 04/21/2020 115  38 - 126 U/L Final  . Total Bilirubin 04/21/2020 1.3* 0.3 - 1.2 mg/dL Final  . GFR, Estimated 04/21/2020 >60  >60 mL/min Final   Comment: (NOTE) Calculated using the CKD-EPI Creatinine Equation (2021)   .  Anion gap 04/21/2020 13  5 - 15 Final   Performed at [facility], [address], [place], [place] [address]  . WBC 04/21/2020 8.7  4.0 - 10.5 K/uL Final  . RBC 04/21/2020 5.78* 3.87 - 5.11 MIL/uL Final  . Hemoglobin 04/21/2020 16.2* 12.0 - 15.0 g/dL Final  . HCT 88/41/6606 50.7* 36 - 46 % Final  . MCV 04/21/2020 87.7  80.0 - 100.0 fL Final  . MCH 04/21/2020 28.0  26.0 - 34.0 pg Final  . MCHC 04/21/2020 32.0  30.0 - 36.0 g/dL Final  . RDW 30/16/0109 13.4  11.5 - 15.5 % Final  . Platelets 04/21/2020 260  150 - 400 K/uL Final  . nRBC 04/21/2020 0.0  0.0 - 0.2 % Final   Performed at [facility], [address], [place], [place] [address]  . I-stat hCG, quantitative 04/21/2020 <5.0  <5 mIU/mL Final  . Comment 3 04/21/2020          Final   Comment:   GEST. AGE      CONC.  (mIU/mL)   <=1 WEEK        5 - 50     2 WEEKS       50 - 500     3 WEEKS       100 - 10,000  4 WEEKS     1,000 - 30,000        FEMALE AND NON-PREGNANT FEMALE:     LESS THAN 5 mIU/mL    Patient states that for the last month she has become increasingly nauseated.  She states she always feels nauseated.  She is able to eat however she feels like she is going to throw up as soon as she eats.  This began roughly around the same time she switched to Ozempic per her report.  Also she reports a burning pain in her left upper quadrant and occasionally she is vomiting phlegm that has a blackish tinge to it.  She denies any melena or hematochezia in fact she states she has not had a bowel movement in the last 10 days.  She has been try MiraLAX, Dulcolax, Senokot with no relief.  Now she reports left-sided abdominal discomfort, constant nausea, occasional emesis, feeling weak and tired.  On exam her abdomen is soft nondistended with only minimal tenderness in the left upper quadrant.  There is no guarding or rebound however she has minimal bowel sounds. Past Medical History:  Diagnosis Date  . Abnormal vaginal Pap smear      tx with cryotherapy  . Anxiety   . Depression   . Diabetes mellitus without complication (HCC)   . History of kidney stones    passed stone - no surgery  . Hypertension   . Migraine    otc med prn  . Obesity   . Pseudotumor cerebri   . SVD (spontaneous vaginal delivery)    x 3  . Tension headache    Past Surgical History:  Procedure Laterality Date  . ABDOMINAL HYSTERECTOMY    . ANTERIOR CERVICAL DECOMP/DISCECTOMY FUSION N/A 01/03/2016   Procedure: ANTERIOR CERVICAL DECOMPRESSION FUSION CERVICAL FIVE-SIX,CERVICAL SIX-SEVEN.;  Surgeon: Lisbeth Renshaw, MD;  Location: [facility];  Service: Neurosurgery;  Laterality: N/A;  right side approach  . CRYOTHERAPY    . LAPAROSCOPIC ASSISTED VAGINAL HYSTERECTOMY Bilateral 04/21/2014   Procedure: LAPAROSCOPIC ASSISTED VAGINAL HYSTERECTOMY, BILATERAL SALPINGECTOMY;  Surgeon: Lavina Hamman, MD;  Location: [facility];  Service: Gynecology;  Laterality: Bilateral;  . MANDIBLE FRACTURE SURGERY  2002   x 1  . TUBAL LIGATION  11/2003  . WISDOM TOOTH EXTRACTION     Current Outpatient Medications on File Prior to Visit  Medication Sig Dispense Refill  . ACCU-CHEK GUIDE test strip USE AS INSTRUCTED TO TEST BLOOD SUGAR 5 TIMES DAILY. 100 each 12  . acetaminophen (TYLENOL) 325 MG tablet Take 650 mg by mouth every 6 (six) hours as needed for mild pain or headache.    Marland Kitchen acetaZOLAMIDE (DIAMOX) 250 MG tablet Take 1 tablet (250 mg total) by mouth 2 (two) times daily. 180 tablet 1  . ALPRAZolam (XANAX) 1 MG tablet TAKE 1 TABLET (1 MG TOTAL) BY MOUTH 3 (THREE) TIMES DAILY AS NEEDED. (Patient taking differently: Take 1 mg by mouth 3 (three) times daily as needed for anxiety. ) 90 tablet 0  . atenolol (TENORMIN) 100 MG tablet TAKE 1 TABLET BY MOUTH EVERY DAY (Patient taking differently: Take 100 mg by mouth daily. ) 90 tablet 3  . Blood Glucose Monitoring Suppl (ACCU-CHEK GUIDE ME) w/Device KIT 1 each by Does not apply route daily. 1 kit 0  . buPROPion  (WELLBUTRIN XL) 300 MG 24 hr tablet TAKE 1 TABLET BY MOUTH EVERY DAY (Patient taking differently: Take 300 mg by mouth daily. ) 90 tablet 2  . cetirizine (ZYRTEC) 10 MG  tablet TAKE 1 TABLET BY MOUTH EVERY DAY (Patient taking differently: Take 10 mg by mouth at bedtime. ) 30 tablet 11  . cloNIDine (CATAPRES) 0.1 MG tablet Take 0.1 mg by mouth daily as needed for anxiety.    Marland Kitchen desvenlafaxine (PRISTIQ) 100 MG 24 hr tablet Take 100 mg by mouth every morning.    . fluticasone (FLONASE) 50 MCG/ACT nasal spray SPRAY 2 SPRAYS INTO EACH NOSTRIL EVERY DAY (Patient taking differently: Place 2 sprays into both nostrils every other day. ) 16 mL 6  . furosemide (LASIX) 40 MG tablet TAKE 1 TABLET (40 MG TOTAL) BY MOUTH DAILY AS NEEDED FOR FLUID. 30 tablet 1  . glucose blood test strip 1 each by Other route daily. Use as instructed to check blood sugar before meals, 2 hours after meals, and before bed. DX:E11.65 250 each 3  . glucose blood test strip 1 each by Other route as needed for other. Use as instructed to check blood sugar 4 times daily. DX:E11.65 200 each 11  . Insulin Disposable Pump (OMNIPOD DASH 5 PACK PODS) MISC APPLY ONE POD TO BODY EVERY 3 DAYS FOR INSULIN DELIVERY. 30 each 0  . Insulin Pen Needle 32G X 5 MM MISC Use 1-4 times daily as needed or directed 100 each 0  . insulin regular human CONCENTRATED (HUMULIN R) 500 UNIT/ML injection take between 16-20 units before each meal depending on meal size 3 times a day (Patient taking differently: Inject 20 Units into the skin 3 (three) times daily with meals. ) 20 mL 1  . Insulin Syringe-Needle U-100 (BD INSULIN SYRINGE U/F) 31G X 5/16" 0.5 ML MISC Use with meal three times a day 100 each 0  . Lancets Misc. (ACCU-CHEK FASTCLIX LANCET) KIT 1 each by Does not apply route daily. Use to check blood sugar 5 times daily. 1 kit 12  . ondansetron (ZOFRAN) 4 MG tablet Take 1 tablet (4 mg total) by mouth every 8 (eight) hours as needed for up to 20 doses for nausea or  vomiting. 20 tablet 0  . pantoprazole (PROTONIX) 20 MG tablet Take 1 tablet (20 mg total) by mouth daily. 30 tablet 0  . rosuvastatin (CRESTOR) 10 MG tablet TAKE 1 TABLET BY MOUTH EVERY DAY (Patient taking differently: Take 10 mg by mouth daily. ) 90 tablet 3  . Semaglutide,0.25 or 0.5MG /DOS, (OZEMPIC, 0.25 OR 0.5 MG/DOSE,) 2 MG/1.5ML SOPN Inject 0.25 mg into the skin once a week. Inject 0.25mg  under the skin once weekly for 4 weeks and then increase to 0.5mg  weekly. (Patient taking differently: Inject 0.5 mg into the skin every Monday. ) 1.5 mL 1  . sucralfate (CARAFATE) 1 g tablet Take 1 tablet (1 g total) by mouth 4 (four) times daily -  with meals and at bedtime for 14 doses. 14 tablet 0   No current facility-administered medications on file prior to visit.   No Known Allergies Social History   Socioeconomic History  . Marital status: Divorced    Spouse name: Not on file  . Number of children: Not on file  . Years of education: Not on file  . Highest education level: Not on file  Occupational History  . Not on file  Tobacco Use  . Smoking status: Never Smoker  . Smokeless tobacco: Never Used  Substance and Sexual Activity  . Alcohol use: Yes    Alcohol/week: 1.0 standard drink    Types: 1 Glasses of wine per week    Comment: one glass of wine  per week  . Drug use: No  . Sexual activity: Yes    Birth control/protection: Surgical  Other Topics Concern  . Not on file  Social History Narrative  . Not on file   Social Determinants of Health   Financial Resource Strain:   . Difficulty of Paying Living Expenses: Not on file  Food Insecurity:   . Worried About Programme researcher, broadcasting/film/video in the Last Year: Not on file  . Ran Out of Food in the Last Year: Not on file  Transportation Needs:   . Lack of Transportation (Medical): Not on file  . Lack of Transportation (Non-Medical): Not on file  Physical Activity:   . Days of Exercise per Week: Not on file  . Minutes of Exercise per  Session: Not on file  Stress:   . Feeling of Stress : Not on file  Social Connections:   . Frequency of Communication with Friends and Family: Not on file  . Frequency of Social Gatherings with Friends and Family: Not on file  . Attends Religious Services: Not on file  . Active Member of Clubs or Organizations: Not on file  . Attends Banker Meetings: Not on file  . Marital Status: Not on file  Intimate Partner Violence:   . Fear of Current or Ex-Partner: Not on file  . Emotionally Abused: Not on file  . Physically Abused: Not on file  . Sexually Abused: Not on file     Review of Systems  All other systems reviewed and are negative.      Objective:   Physical Exam Vitals reviewed.  Constitutional:      General: She is not in acute distress.    Appearance: She is well-developed. She is not diaphoretic.  Eyes:     Conjunctiva/sclera: Conjunctivae normal.  Cardiovascular:     Rate and Rhythm: Normal rate and regular rhythm.     Heart sounds: Normal heart sounds.  Pulmonary:     Effort: Pulmonary effort is normal. No respiratory distress.     Breath sounds: Normal breath sounds. No wheezing or rales.  Abdominal:     General: Bowel sounds are decreased. There is no distension.     Palpations: Abdomen is soft.     Tenderness: There is abdominal tenderness in the left upper quadrant. There is no guarding or rebound.  Musculoskeletal:     Cervical back: Neck supple.  Lymphadenopathy:     Cervical: No cervical adenopathy.           Assessment & Plan:  Left upper quadrant abdominal pain  Non-intractable vomiting with nausea, unspecified vomiting type  Constipation, unspecified constipation type  Differential diagnosis includes gastritis, gastroparesis, drug side effect from Ozempic, severe constipation.  I believe her symptoms are likely multifactorial and involve elements of all of the for above.  I will treat gastritis but upping her Protonix to 40 mg  twice a day and adding sucralfate 1 g p.o. q. ACH S.  I will treat gastroparesis by adding Reglan 10 mg p.o. 4 times daily.  She can use Zofran for nausea.  I will treat constipation by having her take magnesium citrate today.  I have asked her to stop Ozempic temporarily.  I would like to see the patient back next week to reassess or seek medical attention immediately if worsening

## 2020-04-25 DIAGNOSIS — E1165 Type 2 diabetes mellitus with hyperglycemia: Secondary | ICD-10-CM | POA: Diagnosis not present

## 2020-04-26 ENCOUNTER — Other Ambulatory Visit: Payer: Self-pay

## 2020-04-26 ENCOUNTER — Ambulatory Visit: Payer: Medicaid Other | Admitting: Family Medicine

## 2020-04-26 VITALS — BP 130/90 | HR 88 | Temp 97.6°F | Ht 61.0 in | Wt 270.0 lb

## 2020-04-26 DIAGNOSIS — K3184 Gastroparesis: Secondary | ICD-10-CM

## 2020-04-26 DIAGNOSIS — R112 Nausea with vomiting, unspecified: Secondary | ICD-10-CM | POA: Diagnosis not present

## 2020-04-26 NOTE — Progress Notes (Signed)
Subjective:    Patient ID: Kimberly [name], female    DOB: 01-Feb-1971, 49 y.o.   MRN: [id number]  Was seen in the emergency room yesterday for abdominal pain.  Reviewed the emergency room physician's note.  Lab work included a CBC that showed a normal white blood cell count, slightly elevated hemoglobin and hematocrit likely due to hemoconcentration as well as a CMP that showed mild elevation in liver function test.  CBC was unremarkable and showed nonobstructive left-sided nephrolithiasis but otherwise was normal.  ER physician treated the patient for possible gastritis with sucralfate and Protonix.  She is here today for follow-up.  Also on his differential diagnosis was gastroparesis. No visits with results within 1 Day(s) from this visit.  Latest known visit with results is:  Admission on 04/21/2020, Discharged on 04/21/2020  Component Date Value Ref Range Status  . Lipase 04/21/2020 28  11 - 51 U/L Final   Performed at [facility], [address] [place], [place] [address]  . Sodium 04/21/2020 139  135 - 145 mmol/L Final  . Potassium 04/21/2020 4.0  3.5 - 5.1 mmol/L Final  . Chloride 04/21/2020 107  98 - 111 mmol/L Final  . CO2 04/21/2020 19* 22 - 32 mmol/L Final  . Glucose, Bld 04/21/2020 218* 70 - 99 mg/dL Final   Glucose reference range applies only to samples taken after fasting for at least 8 hours.  . BUN 04/21/2020 12  6 - 20 mg/dL Final  . Creatinine, Ser 04/21/2020 1.11* 0.44 - 1.00 mg/dL Final  . Calcium 62/13/0865 9.4  8.9 - 10.3 mg/dL Final  . Total Protein 04/21/2020 7.7  6.5 - 8.1 g/dL Final  . Albumin 78/46/9629 4.3  3.5 - 5.0 g/dL Final  . AST 52/84/1324 47* 15 - 41 U/L Final  . ALT 04/21/2020 57* 0 - 44 U/L Final  . Alkaline Phosphatase 04/21/2020 115  38 - 126 U/L Final  . Total Bilirubin 04/21/2020 1.3* 0.3 - 1.2 mg/dL Final  . GFR, Estimated 04/21/2020 >60  >60 mL/min Final   Comment: (NOTE) Calculated using the CKD-EPI Creatinine Equation (2021)   .  Anion gap 04/21/2020 13  5 - 15 Final   Performed at [facility], [address], [place], [place] [address]  . WBC 04/21/2020 8.7  4.0 - 10.5 K/uL Final  . RBC 04/21/2020 5.78* 3.87 - 5.11 MIL/uL Final  . Hemoglobin 04/21/2020 16.2* 12.0 - 15.0 g/dL Final  . HCT 72/53/6644 50.7* 36 - 46 % Final  . MCV 04/21/2020 87.7  80.0 - 100.0 fL Final  . MCH 04/21/2020 28.0  26.0 - 34.0 pg Final  . MCHC 04/21/2020 32.0  30.0 - 36.0 g/dL Final  . RDW 03/47/4259 13.4  11.5 - 15.5 % Final  . Platelets 04/21/2020 260  150 - 400 K/uL Final  . nRBC 04/21/2020 0.0  0.0 - 0.2 % Final   Performed at [facility], [address], [place], [place] [address]  . I-stat hCG, quantitative 04/21/2020 <5.0  <5 mIU/mL Final  . Comment 3 04/21/2020          Final   Comment:   GEST. AGE      CONC.  (mIU/mL)   <=1 WEEK        5 - 50     2 WEEKS       50 - 500     3 WEEKS       100 - 10,000  4 WEEKS     1,000 - 30,000        FEMALE AND NON-PREGNANT FEMALE:     LESS THAN 5 mIU/mL    Patient states that for the last month she has become increasingly nauseated.  She states she always feels nauseated.  She is able to eat however she feels like she is going to throw up as soon as she eats.  This began roughly around the same time she switched to Ozempic per her report.  Also she reports a burning pain in her left upper quadrant and occasionally she is vomiting phlegm that has a blackish tinge to it.  She denies any melena or hematochezia in fact she states she has not had a bowel movement in the last 10 days.  She has been try MiraLAX, Dulcolax, Senokot with no relief.  Now she reports left-sided abdominal discomfort, constant nausea, occasional emesis, feeling weak and tired.  On exam her abdomen is soft nondistended with only minimal tenderness in the left upper quadrant.  There is no guarding or rebound however she has minimal bowel sounds.  At that time, my plan was: Differential diagnosis includes gastritis,  gastroparesis, drug side effect from Ozempic, severe constipation.  I believe her symptoms are likely multifactorial and involve elements of all of the for above.  I will treat gastritis but upping her Protonix to 40 mg twice a day and adding sucralfate 1 g p.o. q. ACH S.  I will treat gastroparesis by adding Reglan 10 mg p.o. 4 times daily.  She can use Zofran for nausea.  I will treat constipation by having her take magnesium citrate today.  I have asked her to stop Ozempic temporarily.  I would like to see the patient back next week to reassess or seek medical attention immediately if worsening  04/26/20 Patient states that she feels much better today.  She is approximately 60% better.  She was unable to get the Reglan until Sunday.  The symptoms improved when she started the Reglan.  She is not as nauseated.  She is able to eat now.  She is no longer having any pain in the left upper quadrant.  However she does feel like there is a "lead ball" in her stomach after she eats that just will not pass.  She denies any melena or hematochezia.  She is not had a bowel movement since using the magnesium citrate.  She denies any fevers or chills.  She did not take her Ozempic yesterday so today is her first day off the Ozempic. Past Medical History:  Diagnosis Date  . Abnormal vaginal Pap smear    tx with cryotherapy  . Anxiety   . Depression   . Diabetes mellitus without complication (HCC)   . History of kidney stones    passed stone - no surgery  . Hypertension   . Migraine    otc med prn  . Obesity   . Pseudotumor cerebri   . SVD (spontaneous vaginal delivery)    x 3  . Tension headache    Past Surgical History:  Procedure Laterality Date  . ABDOMINAL HYSTERECTOMY    . ANTERIOR CERVICAL DECOMP/DISCECTOMY FUSION N/A 01/03/2016   Procedure: ANTERIOR CERVICAL DECOMPRESSION FUSION CERVICAL FIVE-SIX,CERVICAL SIX-SEVEN.;  Surgeon: Lisbeth Renshaw, MD;  Location: [facility];  Service: Neurosurgery;   Laterality: N/A;  right side approach  . CRYOTHERAPY    . LAPAROSCOPIC ASSISTED VAGINAL HYSTERECTOMY Bilateral 04/21/2014   Procedure: LAPAROSCOPIC ASSISTED VAGINAL HYSTERECTOMY,  BILATERAL SALPINGECTOMY;  Surgeon: Lavina Hamman, MD;  Location: [facility];  Service: Gynecology;  Laterality: Bilateral;  . MANDIBLE FRACTURE SURGERY  2002   x 1  . TUBAL LIGATION  11/2003  . WISDOM TOOTH EXTRACTION     Current Outpatient Medications on File Prior to Visit  Medication Sig Dispense Refill  . ACCU-CHEK GUIDE test strip USE AS INSTRUCTED TO TEST BLOOD SUGAR 5 TIMES DAILY. 100 each 12  . acetaminophen (TYLENOL) 325 MG tablet Take 650 mg by mouth every 6 (six) hours as needed for mild pain or headache.    Marland Kitchen acetaZOLAMIDE (DIAMOX) 250 MG tablet Take 1 tablet (250 mg total) by mouth 2 (two) times daily. 180 tablet 1  . ALPRAZolam (XANAX) 1 MG tablet TAKE 1 TABLET (1 MG TOTAL) BY MOUTH 3 (THREE) TIMES DAILY AS NEEDED. (Patient taking differently: Take 1 mg by mouth 3 (three) times daily as needed for anxiety. ) 90 tablet 0  . atenolol (TENORMIN) 100 MG tablet TAKE 1 TABLET BY MOUTH EVERY DAY (Patient taking differently: Take 100 mg by mouth daily. ) 90 tablet 3  . Blood Glucose Monitoring Suppl (ACCU-CHEK GUIDE ME) w/Device KIT 1 each by Does not apply route daily. 1 kit 0  . buPROPion (WELLBUTRIN XL) 300 MG 24 hr tablet TAKE 1 TABLET BY MOUTH EVERY DAY (Patient taking differently: Take 300 mg by mouth daily. ) 90 tablet 2  . cetirizine (ZYRTEC) 10 MG tablet TAKE 1 TABLET BY MOUTH EVERY DAY (Patient taking differently: Take 10 mg by mouth at bedtime. ) 30 tablet 11  . cloNIDine (CATAPRES) 0.1 MG tablet Take 0.1 mg by mouth daily as needed for anxiety.    Marland Kitchen desvenlafaxine (PRISTIQ) 100 MG 24 hr tablet Take 100 mg by mouth every morning.    . fluticasone (FLONASE) 50 MCG/ACT nasal spray SPRAY 2 SPRAYS INTO EACH NOSTRIL EVERY DAY (Patient taking differently: Place 2 sprays into both nostrils every other day. ) 16  mL 6  . furosemide (LASIX) 40 MG tablet TAKE 1 TABLET (40 MG TOTAL) BY MOUTH DAILY AS NEEDED FOR FLUID. 30 tablet 1  . glucose blood test strip 1 each by Other route daily. Use as instructed to check blood sugar before meals, 2 hours after meals, and before bed. DX:E11.65 250 each 3  . glucose blood test strip 1 each by Other route as needed for other. Use as instructed to check blood sugar 4 times daily. DX:E11.65 200 each 11  . Insulin Disposable Pump (OMNIPOD DASH 5 PACK PODS) MISC APPLY ONE POD TO BODY EVERY 3 DAYS FOR INSULIN DELIVERY. 30 each 0  . Insulin Pen Needle 32G X 5 MM MISC Use 1-4 times daily as needed or directed 100 each 0  . insulin regular human CONCENTRATED (HUMULIN R) 500 UNIT/ML injection take between 16-20 units before each meal depending on meal size 3 times a day (Patient taking differently: Inject 20 Units into the skin 3 (three) times daily with meals. ) 20 mL 1  . Insulin Syringe-Needle U-100 (BD INSULIN SYRINGE U/F) 31G X 5/16" 0.5 ML MISC Use with meal three times a day 100 each 0  . Lancets Misc. (ACCU-CHEK FASTCLIX LANCET) KIT 1 each by Does not apply route daily. Use to check blood sugar 5 times daily. 1 kit 12  . metoCLOPramide (REGLAN) 10 MG tablet Take 1 tablet (10 mg total) by mouth 4 (four) times daily. 30 tablet 0  . ondansetron (ZOFRAN) 4 MG tablet Take 1 tablet (4  mg total) by mouth every 8 (eight) hours as needed for up to 20 doses for nausea or vomiting. 20 tablet 0  . pantoprazole (PROTONIX) 20 MG tablet Take 1 tablet (20 mg total) by mouth daily. 30 tablet 0  . rosuvastatin (CRESTOR) 10 MG tablet TAKE 1 TABLET BY MOUTH EVERY DAY (Patient taking differently: Take 10 mg by mouth daily. ) 90 tablet 3  . Semaglutide,0.25 or 0.5MG /DOS, (OZEMPIC, 0.25 OR 0.5 MG/DOSE,) 2 MG/1.5ML SOPN Inject 0.25 mg into the skin once a week. Inject 0.25mg  under the skin once weekly for 4 weeks and then increase to 0.5mg  weekly. (Patient taking differently: Inject 0.5 mg into the  skin every Monday. ) 1.5 mL 1  . sucralfate (CARAFATE) 1 g tablet Take 1 tablet (1 g total) by mouth 4 (four) times daily -  with meals and at bedtime for 14 doses. 14 tablet 0   No current facility-administered medications on file prior to visit.   No Known Allergies Social History   Socioeconomic History  . Marital status: Divorced    Spouse name: Not on file  . Number of children: Not on file  . Years of education: Not on file  . Highest education level: Not on file  Occupational History  . Not on file  Tobacco Use  . Smoking status: Never Smoker  . Smokeless tobacco: Never Used  Substance and Sexual Activity  . Alcohol use: Yes    Alcohol/week: 1.0 standard drink    Types: 1 Glasses of wine per week    Comment: one glass of wine per week  . Drug use: No  . Sexual activity: Yes    Birth control/protection: Surgical  Other Topics Concern  . Not on file  Social History Narrative  . Not on file   Social Determinants of Health   Financial Resource Strain:   . Difficulty of Paying Living Expenses: Not on file  Food Insecurity:   . Worried About Programme researcher, broadcasting/film/video in the Last Year: Not on file  . Ran Out of Food in the Last Year: Not on file  Transportation Needs:   . Lack of Transportation (Medical): Not on file  . Lack of Transportation (Non-Medical): Not on file  Physical Activity:   . Days of Exercise per Week: Not on file  . Minutes of Exercise per Session: Not on file  Stress:   . Feeling of Stress : Not on file  Social Connections:   . Frequency of Communication with Friends and Family: Not on file  . Frequency of Social Gatherings with Friends and Family: Not on file  . Attends Religious Services: Not on file  . Active Member of Clubs or Organizations: Not on file  . Attends Banker Meetings: Not on file  . Marital Status: Not on file  Intimate Partner Violence:   . Fear of Current or Ex-Partner: Not on file  . Emotionally Abused: Not on file   . Physically Abused: Not on file  . Sexually Abused: Not on file     Review of Systems  All other systems reviewed and are negative.      Objective:   Physical Exam Vitals reviewed.  Constitutional:      General: She is not in acute distress.    Appearance: She is well-developed. She is not diaphoretic.  Eyes:     Conjunctiva/sclera: Conjunctivae normal.  Cardiovascular:     Rate and Rhythm: Normal rate and regular rhythm.  Heart sounds: Normal heart sounds.  Pulmonary:     Effort: Pulmonary effort is normal. No respiratory distress.     Breath sounds: Normal breath sounds. No wheezing or rales.  Abdominal:     General: Bowel sounds are normal. There is no distension.     Palpations: Abdomen is soft.     Tenderness: There is no abdominal tenderness. There is no guarding or rebound.  Musculoskeletal:     Cervical back: Neck supple.  Lymphadenopathy:     Cervical: No cervical adenopathy.           Assessment & Plan:  Gastroparesis - Plan: Ambulatory referral to Gastroenterology  Non-intractable vomiting with nausea, unspecified vomiting type - Plan: Ambulatory referral to Gastroenterology  Bowel sounds have improved.  Symptoms are better.  My personal suspicion is that she has gastroparesis due to longstanding uncontrolled diabetes.  I have recommended discontinuation of sucralfate.  I would continue the Protonix for the present time.  Continue Reglan at 4 times a day for the next 48 hours and then once she is back to her baseline I would like her to wean off the Reglan gradually over the next week.  Begin MiraLAX daily for constipation.  Stay off Ozempic and consult her endocrinologist soon as possible to find a replacement as this likely contributed.  Consult GI to arrange gastric emptying study and evaluation to determine if she does have gastroparesis.

## 2020-04-30 ENCOUNTER — Other Ambulatory Visit: Payer: Self-pay | Admitting: Family Medicine

## 2020-05-04 ENCOUNTER — Telehealth: Payer: Self-pay | Admitting: Family Medicine

## 2020-05-04 ENCOUNTER — Other Ambulatory Visit: Payer: Self-pay | Admitting: Endocrinology

## 2020-05-04 NOTE — Telephone Encounter (Signed)
Pt hasn't received a  call concern her referral to see a Gastroenterolgy

## 2020-05-05 NOTE — Telephone Encounter (Signed)
I have called the patient and gave her the number to [facility]. She is going to call and schedule the appt.

## 2020-05-11 ENCOUNTER — Other Ambulatory Visit: Payer: Self-pay | Admitting: Endocrinology

## 2020-05-11 ENCOUNTER — Other Ambulatory Visit: Payer: Self-pay | Admitting: Family Medicine

## 2020-05-16 DIAGNOSIS — M25561 Pain in right knee: Secondary | ICD-10-CM | POA: Diagnosis not present

## 2020-05-18 ENCOUNTER — Other Ambulatory Visit: Payer: Self-pay | Admitting: Family Medicine

## 2020-05-18 NOTE — Telephone Encounter (Signed)
Please advise 

## 2020-05-30 ENCOUNTER — Telehealth: Payer: Self-pay | Admitting: Family Medicine

## 2020-05-30 DIAGNOSIS — S83231A Complex tear of medial meniscus, current injury, right knee, initial encounter: Secondary | ICD-10-CM | POA: Diagnosis not present

## 2020-05-30 DIAGNOSIS — G8918 Other acute postprocedural pain: Secondary | ICD-10-CM | POA: Diagnosis not present

## 2020-05-30 DIAGNOSIS — S83271A Complex tear of lateral meniscus, current injury, right knee, initial encounter: Secondary | ICD-10-CM | POA: Diagnosis not present

## 2020-05-30 NOTE — Telephone Encounter (Signed)
sinus infection call in something can't get here due to short staffed-sudafed and mucinex-- patient left vm on [facility]'s line on Friday 05/27/20

## 2020-05-30 NOTE — Telephone Encounter (Signed)
I spoke with patient she states she just had knee surgery today and she is fine she no longer needs medication.

## 2020-06-01 ENCOUNTER — Other Ambulatory Visit: Payer: Self-pay | Admitting: Family Medicine

## 2020-06-09 ENCOUNTER — Other Ambulatory Visit: Payer: Self-pay | Admitting: Family Medicine

## 2020-06-15 ENCOUNTER — Other Ambulatory Visit: Payer: Self-pay

## 2020-06-15 MED ORDER — ALPRAZOLAM 1 MG PO TABS
1.0000 mg | ORAL_TABLET | Freq: Three times a day (TID) | ORAL | 0 refills | Status: DC | PRN
Start: 2020-06-15 — End: 2020-08-04

## 2020-06-15 NOTE — Telephone Encounter (Signed)
Patient is going out of town today, the pharmacy said they could not refill two days early unless Provider ok it.

## 2020-06-15 NOTE — Telephone Encounter (Signed)
Okay to fill? 

## 2020-06-21 ENCOUNTER — Ambulatory Visit: Payer: Medicaid Other | Attending: Orthopedic Surgery | Admitting: Physical Therapy

## 2020-06-22 ENCOUNTER — Telehealth: Payer: Self-pay | Admitting: *Deleted

## 2020-06-22 NOTE — Telephone Encounter (Signed)
Received call from patient.    Reports that she has been sick x4 days. States that Sx began with HA and body aches. Reports that she now has fever (T max 102.3), chills, body aches, cough. Reports that she was unable to obtain test at testing site, but home test is positive  Advised to use Sx management.   Message sent to [facility].  Patient is Coivd + and PCP feels they would benefit from infusion.  Patient has increased risk factors D/T HTN/DM Patient is currently experiencing fever (T max 102.3), chills, body aches, cough  Medium- High priority.  Thank you. =

## 2020-06-23 ENCOUNTER — Telehealth: Payer: Self-pay | Admitting: Unknown Physician Specialty

## 2020-06-23 ENCOUNTER — Ambulatory Visit (HOSPITAL_COMMUNITY)
Admission: RE | Admit: 2020-06-23 | Discharge: 2020-06-23 | Disposition: A | Discharge status: Home / Self Care | Payer: Medicaid Other | Source: Ambulatory Visit | Attending: Pulmonary Disease | Admitting: Pulmonary Disease

## 2020-06-23 ENCOUNTER — Other Ambulatory Visit: Payer: Self-pay | Admitting: Unknown Physician Specialty

## 2020-06-23 ENCOUNTER — Encounter: Payer: Self-pay | Admitting: Unknown Physician Specialty

## 2020-06-23 DIAGNOSIS — U071 COVID-19: Secondary | ICD-10-CM | POA: Diagnosis not present

## 2020-06-23 DIAGNOSIS — E119 Type 2 diabetes mellitus without complications: Secondary | ICD-10-CM | POA: Diagnosis not present

## 2020-06-23 DIAGNOSIS — Z794 Long term (current) use of insulin: Secondary | ICD-10-CM

## 2020-06-23 MED ORDER — DIPHENHYDRAMINE HCL 50 MG/ML IJ SOLN: 50.0000 mg | Freq: Once | INTRAMUSCULAR | Status: DC | PRN

## 2020-06-23 MED ORDER — SODIUM CHLORIDE 0.9 % IV SOLN: INTRAVENOUS | Status: DC | PRN

## 2020-06-23 MED ORDER — SODIUM CHLORIDE 0.9 % IV SOLN: Freq: Once | INTRAVENOUS | Status: AC

## 2020-06-23 MED ORDER — FAMOTIDINE IN NACL 20-0.9 MG/50ML-% IV SOLN: 20.0000 mg | Freq: Once | INTRAVENOUS | Status: DC | PRN

## 2020-06-23 MED ORDER — EPINEPHRINE 0.3 MG/0.3ML IJ SOAJ: 0.3000 mg | Freq: Once | INTRAMUSCULAR | Status: DC | PRN

## 2020-06-23 MED ORDER — ALBUTEROL SULFATE HFA 108 (90 BASE) MCG/ACT IN AERS: 2.0000 | INHALATION_SPRAY | Freq: Once | RESPIRATORY_TRACT | Status: DC | PRN

## 2020-06-23 MED ORDER — METHYLPREDNISOLONE SODIUM SUCC 125 MG IJ SOLR: 125.0000 mg | Freq: Once | INTRAMUSCULAR | Status: DC | PRN

## 2020-06-23 NOTE — Telephone Encounter (Signed)
Called to Discuss with patient about Covid symptoms and the use of the monoclonal antibody infusion for those with mild to moderate Covid symptoms and at a high risk of hospitalization.     Pt appears to qualify for this infusion due to co-morbid conditions and/or a member of an at-risk group in accordance with the FDA Emergency Use Authorization.    Unable to reach pt   [facility] 

## 2020-06-23 NOTE — Telephone Encounter (Signed)
I connected by phone with Charletta Cousin on 06/23/2020 at 10:07 AM to discuss the potential use of a new treatment for mild to moderate COVID-19 viral infection in non-hospitalized patients.  This patient is a 49 y.o. female that meets the FDA criteria for Emergency Use Authorization of COVID monoclonal antibody casirivimab/imdevimab, bamlanivimab/etesevimab, or sotrovimab.  Has a (+) direct SARS-CoV-2 viral test result  Has mild or moderate COVID-19   Is NOT hospitalized due to COVID-19  Is within 10 days of symptom onset  Has at least one of the high risk factor(s) for progression to severe COVID-19 and/or hospitalization as defined in EUA.  Specific high risk criteria : BMI > 25   I have spoken and communicated the following to the patient or parent/caregiver regarding COVID monoclonal antibody treatment:  1. FDA has authorized the emergency use for the treatment of mild to moderate COVID-19 in adults and pediatric patients with positive results of direct SARS-CoV-2 viral testing who are 37 years of age and older weighing at least 40 kg, and who are at high risk for progressing to severe COVID-19 and/or hospitalization.  2. The significant known and potential risks and benefits of COVID monoclonal antibody, and the extent to which such potential risks and benefits are unknown.  3. Information on available alternative treatments and the risks and benefits of those alternatives, including clinical trials.  4. Patients treated with COVID monoclonal antibody should continue to self-isolate and use infection control measures (e.g., wear mask, isolate, social distance, avoid sharing personal items, clean and disinfect "high touch" surfaces, and frequent handwashing) according to CDC guidelines.   5. The patient or parent/caregiver has the option to accept or refuse COVID monoclonal antibody treatment.  After reviewing this information with the patient, the patient has agreed to receive  one of the available covid 19 monoclonal antibodies and will be provided an appropriate fact sheet prior to infusion. Gabriel Cirri, NP 06/23/2020 10:07 AM  Sx onset 12/23

## 2020-06-23 NOTE — Telephone Encounter (Signed)
agree

## 2020-06-23 NOTE — Discharge Instructions (Signed)
10 Things You Can Do to Manage Your COVID-19 Symptoms at Home If you have possible or confirmed COVID-19: 1. Stay home from work and school. And stay away from other public places. If you must go out, avoid using any kind of public transportation, ridesharing, or taxis. 2. Monitor your symptoms carefully. If your symptoms get worse, call your healthcare provider immediately. 3. Get rest and stay hydrated. 4. If you have a medical appointment, call the healthcare provider ahead of time and tell them that you have or may have COVID-19. 5. For medical emergencies, call 911 and notify the dispatch personnel that you have or may have COVID-19. 6. Cover your cough and sneezes with a tissue or use the inside of your elbow. 7. Wash your hands often with soap and water for at least 20 seconds or clean your hands with an alcohol-based hand sanitizer that contains at least 60% alcohol. 8. As much as possible, stay in a specific room and away from other people in your home. Also, you should use a separate bathroom, if available. If you need to be around other people in or outside of the home, wear a mask. 9. Avoid sharing personal items with other people in your household, like dishes, towels, and bedding. 10. Clean all surfaces that are touched often, like counters, tabletops, and doorknobs. Use household cleaning sprays or wipes according to the label instructions. cdc.gov/coronavirus 12/24/2018 This information is not intended to replace advice given to you by your health care provider. Make sure you discuss any questions you have with your health care provider. Document Revised: 05/28/2019 Document Reviewed: 05/28/2019 Elsevier Patient Education  2020 Elsevier Inc. What types of side effects do monoclonal antibody drugs cause?  Common side effects  In general, the more common side effects caused by monoclonal antibody drugs include: . Allergic reactions, such as hives or itching . Flu-like signs and  symptoms, including chills, fatigue, fever, and muscle aches and pains . Nausea, vomiting . Diarrhea . Skin rashes . Low blood pressure   The CDC is recommending patients who receive monoclonal antibody treatments wait at least 90 days before being vaccinated.  Currently, there are no data on the safety and efficacy of mRNA COVID-19 vaccines in persons who received monoclonal antibodies or convalescent plasma as part of COVID-19 treatment. Based on the estimated half-life of such therapies as well as evidence suggesting that reinfection is uncommon in the 90 days after initial infection, vaccination should be deferred for at least 90 days, as a precautionary measure until additional information becomes available, to avoid interference of the antibody treatment with vaccine-induced immune responses. If you have any questions or concerns after the infusion please call the Advanced Practice Provider on call at [phone]. This number is ONLY intended for your use regarding questions or concerns about the infusion post-treatment side-effects.  Please do not provide this number to others for use. For return to work notes please contact your primary care provider.   If someone you know is interested in receiving treatment please have them call the COVID hotline at [phone].   

## 2020-06-23 NOTE — Progress Notes (Signed)
Patient reviewed Fact Sheet for Patients, Parents, and Caregivers for Emergency Use Authorization (EUA) of Casi/Regen for the Treatment of Coronavirus. Patient also reviewed and is agreeable to the estimated cost of treatment. Patient is agreeable to proceed.    

## 2020-06-28 ENCOUNTER — Ambulatory Visit: Payer: Medicaid Other

## 2020-07-05 ENCOUNTER — Ambulatory Visit: Payer: Medicaid Other | Attending: Orthopedic Surgery

## 2020-07-05 DIAGNOSIS — F411 Generalized anxiety disorder: Secondary | ICD-10-CM | POA: Diagnosis not present

## 2020-07-05 DIAGNOSIS — F5081 Binge eating disorder: Secondary | ICD-10-CM | POA: Diagnosis not present

## 2020-07-05 DIAGNOSIS — F331 Major depressive disorder, recurrent, moderate: Secondary | ICD-10-CM | POA: Diagnosis not present

## 2020-07-09 ENCOUNTER — Other Ambulatory Visit: Payer: Self-pay | Admitting: Endocrinology

## 2020-07-12 DIAGNOSIS — E1165 Type 2 diabetes mellitus with hyperglycemia: Secondary | ICD-10-CM | POA: Diagnosis not present

## 2020-07-13 ENCOUNTER — Other Ambulatory Visit: Payer: Self-pay | Admitting: Endocrinology

## 2020-07-13 ENCOUNTER — Other Ambulatory Visit: Payer: Self-pay | Admitting: Family Medicine

## 2020-07-20 ENCOUNTER — Ambulatory Visit: Payer: Medicaid Other | Admitting: Gastroenterology

## 2020-07-21 ENCOUNTER — Other Ambulatory Visit: Payer: Self-pay

## 2020-07-21 MED ORDER — METOCLOPRAMIDE HCL 10 MG PO TABS: ORAL_TABLET | ORAL | 0 refills | Status: DC

## 2020-08-03 ENCOUNTER — Other Ambulatory Visit: Payer: Self-pay | Admitting: Family Medicine

## 2020-08-03 ENCOUNTER — Other Ambulatory Visit: Payer: Self-pay | Admitting: Endocrinology

## 2020-08-03 DIAGNOSIS — F411 Generalized anxiety disorder: Secondary | ICD-10-CM | POA: Diagnosis not present

## 2020-08-03 DIAGNOSIS — F5081 Binge eating disorder: Secondary | ICD-10-CM | POA: Diagnosis not present

## 2020-08-03 DIAGNOSIS — F331 Major depressive disorder, recurrent, moderate: Secondary | ICD-10-CM | POA: Diagnosis not present

## 2020-08-03 NOTE — Telephone Encounter (Signed)
Ok to refill??  Last office visit 04/26/2020.  Last refill 06/14/2020.

## 2020-08-10 DIAGNOSIS — E1165 Type 2 diabetes mellitus with hyperglycemia: Secondary | ICD-10-CM | POA: Diagnosis not present

## 2020-08-11 ENCOUNTER — Ambulatory Visit (INDEPENDENT_AMBULATORY_CARE_PROVIDER_SITE_OTHER): Payer: Medicaid Other | Admitting: Endocrinology

## 2020-08-11 ENCOUNTER — Other Ambulatory Visit: Payer: Self-pay

## 2020-08-11 ENCOUNTER — Encounter: Payer: Self-pay | Admitting: Endocrinology

## 2020-08-11 VITALS — BP 148/96 | HR 80 | Ht 60.98 in | Wt 276.2 lb

## 2020-08-11 DIAGNOSIS — E119 Type 2 diabetes mellitus without complications: Secondary | ICD-10-CM

## 2020-08-11 DIAGNOSIS — E1165 Type 2 diabetes mellitus with hyperglycemia: Secondary | ICD-10-CM

## 2020-08-11 DIAGNOSIS — Z794 Long term (current) use of insulin: Secondary | ICD-10-CM

## 2020-08-11 DIAGNOSIS — F411 Generalized anxiety disorder: Secondary | ICD-10-CM | POA: Diagnosis not present

## 2020-08-11 DIAGNOSIS — I10 Essential (primary) hypertension: Secondary | ICD-10-CM

## 2020-08-11 DIAGNOSIS — E782 Mixed hyperlipidemia: Secondary | ICD-10-CM | POA: Diagnosis not present

## 2020-08-11 DIAGNOSIS — F5081 Binge eating disorder: Secondary | ICD-10-CM | POA: Diagnosis not present

## 2020-08-11 DIAGNOSIS — F331 Major depressive disorder, recurrent, moderate: Secondary | ICD-10-CM | POA: Diagnosis not present

## 2020-08-11 DIAGNOSIS — F4310 Post-traumatic stress disorder, unspecified: Secondary | ICD-10-CM | POA: Diagnosis not present

## 2020-08-11 LAB — POCT GLUCOSE (DEVICE FOR HOME USE): POC Glucose: 279 mg/dl — AB (ref 70–99)

## 2020-08-11 LAB — POCT GLYCOSYLATED HEMOGLOBIN (HGB A1C): Hemoglobin A1C: 8.1 % — AB (ref 4.0–5.6)

## 2020-08-11 LAB — LDL CHOLESTEROL, DIRECT: Direct LDL: 92 mg/dL

## 2020-08-11 LAB — COMPREHENSIVE METABOLIC PANEL
ALT: 35 U/L (ref 0–35)
AST: 23 U/L (ref 0–37)
Albumin: 4.2 g/dL (ref 3.5–5.2)
Alkaline Phosphatase: 118 U/L — ABNORMAL HIGH (ref 39–117)
BUN: 15 mg/dL (ref 6–23)
CO2: 27 mEq/L (ref 19–32)
Calcium: 9.3 mg/dL (ref 8.4–10.5)
Chloride: 104 mEq/L (ref 96–112)
Creatinine, Ser: 0.96 mg/dL (ref 0.40–1.20)
GFR: 69.36 mL/min (ref >60.00)
Glucose, Bld: 271 mg/dL — ABNORMAL HIGH (ref 70–99)
Potassium: 4.1 mEq/L (ref 3.5–5.1)
Sodium: 136 mEq/L (ref 135–145)
Total Bilirubin: 0.5 mg/dL (ref 0.2–1.2)
Total Protein: 7.2 g/dL (ref 6.0–8.3)

## 2020-08-11 LAB — LIPID PANEL
Cholesterol: 157 mg/dL (ref 0–200)
HDL: 40.5 mg/dL (ref >39.00)
NonHDL: 116.96
Total CHOL/HDL Ratio: 4
Triglycerides: 269 mg/dL — ABNORMAL HIGH (ref 0.0–149.0)
VLDL: 53.8 mg/dL — ABNORMAL HIGH (ref 0.0–40.0)

## 2020-08-11 LAB — TSH: TSH: 2.86 u[IU]/mL (ref 0.35–4.50)

## 2020-08-11 MED ORDER — BYDUREON BCISE 2 MG/0.85ML ~~LOC~~ AUIJ: AUTO-INJECTOR | SUBCUTANEOUS | 1 refills | Status: DC

## 2020-08-11 MED ORDER — LOSARTAN POTASSIUM 50 MG PO TABS: 50.0000 mg | ORAL_TABLET | Freq: Every day | ORAL | 3 refills | Status: DC

## 2020-08-11 NOTE — Patient Instructions (Addendum)
Take 14 units at lunch if eating  Am dose is 20 units  Bedtime 16 units daily  Call Omnipod  Check blood sugars on waking up 5 days a week  Also check blood sugars about 2 hours after meals and do this after different meals by rotation  Recommended blood sugar levels on waking up are 90-130 and about 2 hours after meal is 130-160  Please bring your blood sugar monitor to each visit, thank you

## 2020-08-11 NOTE — Progress Notes (Signed)
Patient ID: Kimberly [name], female   DOB: 1970-09-28, 50 y.o.   MRN: [id number]          Reason for Appointment: Follow-up for Type 2 Diabetes   History of Present Illness:          Date of diagnosis of type 2 diabetes mellitus: 12/2014   Background history:   She was initially tried on metformin but because of side effects as was stopped and she was tried on glipizide and subsequently [place].  She apparently took Jardiance for at least a couple of months but because of recurrent yeast infection she stopped it and she did not think it was helping her glucose also He was started on Lantus insulin in 12/2015 Subsequently Bydureon was added in 03/2016  Recent history:  Current insulin: Humulin R U-500, 24 units before meals and 10 units at bedtime  Previous regimen with Omni pod/Dash::  Basal rate:  MN: 0.8, 5 AM = 1.3, 12 PM = 0.9, 5 PM = 1.0.  Total basal insulin 25.6 units I/C ratio 10, target 140 with corrections over 120, ISF: 60, and timing: 6 hours. Boluses 8-12 units with meals   Non-insulin hypoglycemic drugs the patient is taking are: Bydureon 2 mg weekly  Most recent A1c is 8.1, was 8.9 previously  Fructosamine previously 225  Current management, blo od sugar patterns and problems identified:  She has not been seen in follow-up since 10/21   She is still not able to get her PDM replacement for her pump because of high out-of-pocket expense  Unable to download her meter today because it had low battery level  She thinks her blood sugars are fluctuating excessively but review of her recent readings indicates mostly higher readings in the mornings at lunchtime and occasional low blood sugars in the evening and rarely around lunchtime  However difficult to assess her readings as it is not clear if her meter has the right time programmed  She was told to get the FREESTYLE libre but she is unable to get it from the DME supplier list that was given  She says she  stopped her OZEMPIC because she was told it may have been causing her constipation at the hospital; she thinks that constipation resolved 2 weeks after stopping the Ozempic which she had been taking long-term previously  She is currently taking Bydureon from a previous prescription  Insulin regimen: She says that she is usually not taking insulin doses at lunch since she is not allowed to take an insulin pen at work  Also sometimes on weekends will not eat lunch  Insulin doses: She was given specific doses to take at different meals but is taking 24 units at breakfast and dinnertime without adjustment based on what she is eating and 10 units at bedtime        Side effects from medications : Nausea and diarrhea from any dose metformin, recurrent yeast infections from Jardiance  Glucose monitoring:  3-4 times a day       Glucometer:  Accu-Chek  .      Blood Glucose readings from Accu-Chek review   PRE-MEAL Fasting Lunch Dinner Bedtime Overall  Glucose range:  166-285  60-287  61-232  67-267   Mean/median:     ?     Previous data: AVERAGE 190 MORNING range recently 163-254 Nonfasting recently 61-268   Self-care:  Typical meal intake: Breakfast will be either  bagel, scrambled egg/oatmeal, juice.  Sometimes only toast and juice  Lunch may be sandwich or leftovers Dinner is variable, Snacks will be yogurt or chips            Dietician visit, most recent: 08/2017              Weight history:  Wt Readings from Last 3 Encounters:  08/11/20 276 lb 3.2 oz (125.3 kg)  04/26/20 270 lb (122.5 kg)  04/22/20 265 lb (120.2 kg)    `Glycemic control:   Lab Results  Component Value Date   HGBA1C 8.1 (A) 08/11/2020   HGBA1C 8.9 (H) 04/13/2020   HGBA1C 7.7 (H) 06/15/2019   Lab Results  Component Value Date   MICROALBUR 1.1 04/13/2020   LDLCALC 90 10/21/2018   CREATININE 0.96 08/11/2020   Lab Results  Component Value Date   MICRALBCREAT 0.7 04/13/2020    Lab Results   Component Value Date   FRUCTOSAMINE 225 08/12/2019   FRUCTOSAMINE 218 10/21/2018   FRUCTOSAMINE 312 (H) 03/06/2018      Allergies as of 08/11/2020   No Known Allergies     Medication List       Accurate as of August 11, 2020  9:40 PM. If you have any questions, ask your nurse or doctor.        STOP taking these medications   Ozempic (0.25 or 0.5 MG/DOSE) 2 MG/1.5ML Sopn Generic drug: Semaglutide(0.25 or 0.5MG /DOS) Stopped by: Reather Littler, MD     TAKE these medications   Accu-Chek FastClix Lancet Kit 1 each by Does not apply route daily. Use to check blood sugar 5 times daily.   Accu-Chek Guide Me w/Device Kit 1 each by Does not apply route daily.   acetaminophen 325 MG tablet Commonly known as: TYLENOL Take 650 mg by mouth every 6 (six) hours as needed for mild pain or headache.   acetaZOLAMIDE 250 MG tablet Commonly known as: DIAMOX TAKE 1 TABLET BY MOUTH TWICE A DAY   ALPRAZolam 1 MG tablet Commonly known as: XANAX TAKE 1 TABLET BY MOUTH THREE TIMES A DAY AS NEEDED   atenolol 100 MG tablet Commonly known as: TENORMIN TAKE 1 TABLET BY MOUTH EVERY DAY   BD Veo Insulin Syringe U/F 31G X 15/64" 0.5 ML Misc Generic drug: Insulin Syringe-Needle U-100 USE WITH MEAL THREE TIMES A DAY   buPROPion 300 MG 24 hr tablet Commonly known as: WELLBUTRIN XL TAKE 1 TABLET BY MOUTH EVERY DAY   Bydureon BCise 2 MG/0.85ML Auij Generic drug: Exenatide ER Inject weekly Started by: Reather Littler, MD   cetirizine 10 MG tablet Commonly known as: ZYRTEC TAKE 1 TABLET BY MOUTH EVERY DAY What changed: when to take this   cloNIDine 0.1 MG tablet Commonly known as: CATAPRES Take 0.1 mg by mouth daily as needed for anxiety.   desvenlafaxine 100 MG 24 hr tablet Commonly known as: PRISTIQ Take 100 mg by mouth every morning.   fluticasone 50 MCG/ACT nasal spray Commonly known as: FLONASE SPRAY 2 SPRAYS INTO EACH NOSTRIL EVERY DAY   furosemide 40 MG tablet Commonly known  as: LASIX TAKE 1 TABLET (40 MG TOTAL) BY MOUTH DAILY AS NEEDED FOR FLUID.   glucose blood test strip 1 each by Other route daily. Use as instructed to check blood sugar before meals, 2 hours after meals, and before bed. DX:E11.65   glucose blood test strip 1 each by Other route as needed for other. Use as instructed to check blood sugar 4 times daily. DX:E11.65   Accu-Chek Guide test strip Generic drug: glucose  blood USE AS INSTRUCTED TO TEST BLOOD SUGAR 5 TIMES DAILY   HUMULIN R 500 UNIT/ML injection Generic drug: insulin regular human CONCENTRATED INJECT 16 TO 20 UNITS BEFORE MEAL DEPENDING ON MEAL SIZE 3 TIMES A DAY. DISCARD OPENED VIAL AFTER 40 DAYS.   Insulin Pen Needle 32G X 5 MM Misc Use 1-4 times daily as needed or directed   losartan 50 MG tablet Commonly known as: COZAAR Take 1 tablet (50 mg total) by mouth daily. Started by: Reather Littler, MD   metoCLOPramide 10 MG tablet Commonly known as: REGLAN TAKE 1 TABLET BY MOUTH 4 TIMES DAILY.   OmniPod Dash 5 Pack Pods Misc APPLY ONE POD TO BODY EVERY 3 DAYS FOR INSULIN DELIVERY.   ondansetron 4 MG tablet Commonly known as: ZOFRAN Take 1 tablet (4 mg total) by mouth every 8 (eight) hours as needed for up to 20 doses for nausea or vomiting.   pantoprazole 20 MG tablet Commonly known as: PROTONIX Take 1 tablet (20 mg total) by mouth daily.   rosuvastatin 10 MG tablet Commonly known as: CRESTOR TAKE 1 TABLET BY MOUTH EVERY DAY   sucralfate 1 g tablet Commonly known as: Carafate Take 1 tablet (1 g total) by mouth 4 (four) times daily -  with meals and at bedtime for 14 doses.   traZODone 100 MG tablet Commonly known as: DESYREL       Allergies: No Known Allergies  Past Medical History:  Diagnosis Date  . Abnormal vaginal Pap smear    tx with cryotherapy  . Anxiety   . Depression   . Diabetes mellitus without complication (HCC)   . History of kidney stones    passed stone - no surgery  . Hypertension   .  Migraine    otc med prn  . Obesity   . Pseudotumor cerebri   . SVD (spontaneous vaginal delivery)    x 3  . Tension headache     Past Surgical History:  Procedure Laterality Date  . ABDOMINAL HYSTERECTOMY    . ANTERIOR CERVICAL DECOMP/DISCECTOMY FUSION N/A 01/03/2016   Procedure: ANTERIOR CERVICAL DECOMPRESSION FUSION CERVICAL FIVE-SIX,CERVICAL SIX-SEVEN.;  Surgeon: Lisbeth Renshaw, MD;  Location: [facility];  Service: Neurosurgery;  Laterality: N/A;  right side approach  . CRYOTHERAPY    . LAPAROSCOPIC ASSISTED VAGINAL HYSTERECTOMY Bilateral 04/21/2014   Procedure: LAPAROSCOPIC ASSISTED VAGINAL HYSTERECTOMY, BILATERAL SALPINGECTOMY;  Surgeon: Lavina Hamman, MD;  Location: [facility];  Service: Gynecology;  Laterality: Bilateral;  . MANDIBLE FRACTURE SURGERY  2002   x 1  . TUBAL LIGATION  11/2003  . WISDOM TOOTH EXTRACTION      Family History  Problem Relation Age of Onset  . Hypertension Mother   . Diabetes Mother   . Breast cancer Mother   . Diabetes Sister   . Hypertension Sister     Social History:  reports that she has never smoked. She has never used smokeless tobacco. She reports current alcohol use of about 1.0 standard drink of alcohol per week. She reports that she does not use drugs.   Review of Systems   Lipid history: Taking Crestor for hyperlipidemia  Lipids are not well controlled in 2021 She thinks she is taking Crestor regularly    Lab Results  Component Value Date   CHOL 157 08/11/2020   CHOL 197 04/13/2020   CHOL 225 (H) 08/12/2019   Lab Results  Component Value Date   HDL 40.50 08/11/2020   HDL 29.40 (L) 04/13/2020   HDL  31.40 (L) 08/12/2019   Lab Results  Component Value Date   LDLCALC 90 10/21/2018   LDLCALC 250 (H) 10/28/2015   LDLCALC 110 (H) 12/31/2012   Lab Results  Component Value Date   TRIG 269.0 (H) 08/11/2020   TRIG 269.0 (H) 04/13/2020   TRIG 272.0 (H) 08/12/2019   Lab Results  Component Value Date   CHOLHDL 4  08/11/2020   CHOLHDL 7 04/13/2020   CHOLHDL 7 08/12/2019   Lab Results  Component Value Date   LDLDIRECT 92.0 08/11/2020   LDLDIRECT 136.0 04/13/2020   LDLDIRECT 159.0 08/12/2019        Liver functions have been variable, mildly increased now     Lab Results  Component Value Date   ALT 35 08/11/2020   Hypertension: This is being treated with atenolol 100 mg and clonidine by her PCP, not clear if she is taking these regularly Not on ACE inhibitor  BP Readings from Last 3 Encounters:  08/11/20 (!) 148/96  06/23/20 (!) 146/78  04/26/20 130/90   She says that she had marked constipation and was also having abdominal discomfort and was given Reglan at the emergency room which has helped her bowel movements and abdominal pain  She is on Trintellix and bupropion for depression   LABS:  Office Visit on 08/11/2020  Component Date Value Ref Range Status  . POC Glucose 08/11/2020 279* 70 - 99 mg/dl Final  . Hemoglobin B1Y 08/11/2020 8.1* 4.0 - 5.6 % Final  . TSH 08/11/2020 2.86  0.35 - 4.50 uIU/mL Final  . Cholesterol 08/11/2020 157  0 - 200 mg/dL Final   ATP III Classification       Desirable:  < 200 mg/dL               Borderline High:  200 - 239 mg/dL          High:  > = 782 mg/dL  . Triglycerides 08/11/2020 269.0* 0.0 - 149.0 mg/dL Final   Normal:  <956 mg/dLBorderline High:  150 - 199 mg/dL  . HDL 08/11/2020 40.50  >39.00 mg/dL Final  . VLDL 21/30/8657 53.8* 0.0 - 40.0 mg/dL Final  . Total CHOL/HDL Ratio 08/11/2020 4   Final                  Men          Women1/2 Average Risk     3.4          3.3Average Risk          5.0          4.42X Average Risk          9.6          7.13X Average Risk          15.0          11.0                      . NonHDL 08/11/2020 116.96   Final   NOTE:  Non-HDL goal should be 30 mg/dL higher than patient's LDL goal (i.e. LDL goal of < 70 mg/dL, would have non-HDL goal of < 100 mg/dL)  . Sodium 08/11/2020 136  135 - 145 mEq/L Final  . Potassium  08/11/2020 4.1  3.5 - 5.1 mEq/L Final  . Chloride 08/11/2020 104  96 - 112 mEq/L Final  . CO2 08/11/2020 27  19 - 32 mEq/L Final  . Glucose, Bld 08/11/2020 271* 70 -  99 mg/dL Final  . BUN 01/60/1093 15  6 - 23 mg/dL Final  . Creatinine, Ser 08/11/2020 0.96  0.40 - 1.20 mg/dL Final  . Total Bilirubin 08/11/2020 0.5  0.2 - 1.2 mg/dL Final  . Alkaline Phosphatase 08/11/2020 118* 39 - 117 U/L Final  . AST 08/11/2020 23  0 - 37 U/L Final  . ALT 08/11/2020 35  0 - 35 U/L Final  . Total Protein 08/11/2020 7.2  6.0 - 8.3 g/dL Final  . Albumin 23/55/7322 4.2  3.5 - 5.2 g/dL Final  . GFR 02/54/2706 69.36  >60.00 mL/min Final   Calculated using the CKD-EPI Creatinine Equation (2021)  . Calcium 08/11/2020 9.3  8.4 - 10.5 mg/dL Final  . Direct LDL 23/76/2831 92.0  mg/dL Final   Optimal:  <517 mg/dLNear or Above Optimal:  100-129 mg/dLBorderline High:  130-159 mg/dLHigh:  160-189 mg/dLVery High:  >190 mg/dL    Physical Examination:  BP (!) 148/96 (BP Location: Left Arm, Patient Position: Sitting)   Pulse 80   Ht 5' 0.98" (1.549 m)   Wt 276 lb 3.2 oz (125.3 kg)   LMP 09/23/2013   SpO2 99%   BMI 52.21 kg/m        ASSESSMENT:  Diabetes type 2, with morbid obesity     Her A1c is slightly better at 8.1  Currently on a regimen of insulin injections using U-500 insulin  Blood sugar monitoring has been sporadic and appears to have mostly high readings in the mornings and occasionally at lunchtime She is mostly taking the insulin before breakfast and dinner and 10 units at bedtime Still not able to get the PDM for her pump Monitoring also is inadequate and she has not been able to get the freestyle libre because of insurance difficulties A1c needs to be close to 7% to reduce further complications  Hypertension: Recently not controlled; also not on ACE inhibitor/ARB                             LIPIDS: Needs follow-up labs on her treatment with Crestor  Recently diagnosed gastroparesis  followed by PCP and on empiric treatment with Reglan  PLAN:    She will try to get the PDM approved by her insurance this year We will try to get her freestyle libre from [facility] More consistent monitoring of her blood sugar after meals especially after dinner May benefit from nutritional counseling  NEW insulin doses will be 20 before breakfast, 14 at lunch and 24 at dinnertime along with 16 at bedtime Continue Bydureon which is reportedly tolerating, new prescription sent  Adjust dose of Crestor based on lipid levels  Start losartan 50 mg daily for improved blood pressure control and renal protection May benefit from home blood pressure monitor  Patient Instructions  Take 14 units at lunch if eating  Am dose is 20 units  Bedtime 16 units daily  Call Omnipod  Check blood sugars on waking up 5 days a week  Also check blood sugars about 2 hours after meals and do this after different meals by rotation  Recommended blood sugar levels on waking up are 90-130 and about 2 hours after meal is 130-160  Please bring your blood sugar monitor to each visit, thank you           Reather Littler 08/11/2020, 9:40 PM   Note: This office note was prepared with Dragon voice recognition system technology. Any  transcriptional errors that result from this process are unintentional.

## 2020-08-12 ENCOUNTER — Other Ambulatory Visit: Payer: Self-pay | Admitting: *Deleted

## 2020-08-12 MED ORDER — FREESTYLE LIBRE 2 READER DEVI: 0 refills | Status: DC

## 2020-08-12 MED ORDER — FREESTYLE LIBRE 2 SENSOR MISC: 3 refills | Status: DC

## 2020-08-16 ENCOUNTER — Telehealth: Payer: Self-pay | Admitting: Nutrition

## 2020-08-16 NOTE — Telephone Encounter (Signed)
Patient was told that I have a PDM for her and she reported that she did not need help to set it up with her settings.

## 2020-09-01 DIAGNOSIS — E1165 Type 2 diabetes mellitus with hyperglycemia: Secondary | ICD-10-CM | POA: Diagnosis not present

## 2020-09-05 ENCOUNTER — Other Ambulatory Visit: Payer: Self-pay | Admitting: Endocrinology

## 2020-09-05 ENCOUNTER — Other Ambulatory Visit: Payer: Self-pay | Admitting: Family Medicine

## 2020-09-05 ENCOUNTER — Ambulatory Visit: Payer: Medicaid Other | Attending: Orthopedic Surgery | Admitting: Physical Therapy

## 2020-09-05 ENCOUNTER — Other Ambulatory Visit: Payer: Self-pay

## 2020-09-05 ENCOUNTER — Encounter: Payer: Self-pay | Admitting: Physical Therapy

## 2020-09-05 DIAGNOSIS — R262 Difficulty in walking, not elsewhere classified: Secondary | ICD-10-CM | POA: Diagnosis present

## 2020-09-05 DIAGNOSIS — R600 Localized edema: Secondary | ICD-10-CM | POA: Insufficient documentation

## 2020-09-05 DIAGNOSIS — M25561 Pain in right knee: Secondary | ICD-10-CM | POA: Diagnosis present

## 2020-09-05 DIAGNOSIS — Z9889 Other specified postprocedural states: Secondary | ICD-10-CM | POA: Diagnosis present

## 2020-09-05 DIAGNOSIS — G8929 Other chronic pain: Secondary | ICD-10-CM | POA: Insufficient documentation

## 2020-09-05 NOTE — Patient Instructions (Signed)
Access Code: [email or web address] URL: [email or web address] Date: 09/05/2020 Prepared by: Karie Mainland  Exercises Supine Hamstring Stretch with Strap - 2 x daily - 7 x weekly - 1 sets - 3 reps - 30 hold Seated Hamstring Stretch - 2 x daily - 7 x weekly - 1 sets - 3-5 reps - 30 hold Active Straight Leg Raise with Quad Set - 1 x daily - 7 x weekly - 2 sets - 10 reps - 3 hold Sidelying Hip Abduction - 1 x daily - 7 x weekly - 2 sets - 10 reps - 3 hold Seated Calf Stretch with Strap - 1 x daily - 7 x weekly - 1 sets - 3 reps - 30 hold Standing Gastroc Stretch - 1 x daily - 7 x weekly - 1 sets - 3 reps - 30 hold

## 2020-09-06 NOTE — Therapy (Addendum)
Pattern;Comorbidity 2    Comorbidities diabetes, obesity     Examination-Activity Limitations Squat;Bed Mobility;Lift;Stairs;Sleep;Sit;Transfers;Locomotion Level;Stand    Examination-Participation Restrictions Community Activity;Occupation;Interpersonal Relationship;Cleaning;Shop;Laundry    Stability/Clinical Decision Making Evolving/Moderate complexity    Clinical Decision Making Moderate    Rehab Potential Good    PT Frequency 2x / week    PT Duration 6 weeks    PT Treatment/Interventions ADLs/Self Care Home Management;Therapeutic activities;Passive range of motion;Manual techniques;Patient/family education;Therapeutic exercise;Moist Heat;Taping;Functional mobility training;Stair training;Gait training;Cryotherapy    PT Next Visit Plan check HEP, Nustep, Rt knee ROM, quad strength, gait NO Vaso ionto or estim    PT Home Exercise Plan [id number]: calf and hamstring stretch, SLR    Consulted and Agree with Plan of Care Patient           Patient will benefit from skilled therapeutic intervention in order to improve the following deficits and impairments:  Decreased mobility,Difficulty walking,Hypomobility,Obesity,Increased edema,Decreased range of motion,Decreased activity tolerance,Decreased strength,Impaired flexibility,Postural dysfunction,Pain,Increased fascial restricitons  Visit Diagnosis: Chronic pain of right knee - Plan: PT plan of care cert/re-cert  Status post arthroscopic knee surgery - Plan: PT plan of care cert/re-cert  Localized edema - Plan: PT plan of care cert/re-cert  Difficulty in walking, not elsewhere classified - Plan: PT plan of care cert/re-cert     Problem List Patient Active Problem List   Diagnosis Date Noted  . Diabetes (HCC) 04/14/2018  . 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 Abnormal uterine bleeding unrelated to menstrual cycle 04/21/2014  . Anxiety   . Depression   . Pseudotumor cerebri   . Hypertension   . Obesity     Ashlon Lottman 09/06/2020, 10:56 AM  [name] [address] [place], [place], [address] Phone: [phone]   Fax:  [phone]  Name: Kimberly [name] MRN: [id number] Date of Birth: 12/05/1970   .Check all possible CPT codes: 54008- Therapeutic Exercise, [phone]- Neuro Re-education, [phone] - Gait Training, [phone] - Manual Therapy, [phone] - Therapeutic Activities and [address]7535 - Self Care       Kimberly [name], PT 09/06/20 10:56 AM Phone: [phone] Fax: [phone]  [name] [address] [place], [place], [address] Phone: [phone]   Fax:  [phone]  Physical Therapy Evaluation  Patient Details  Name: Kimberly [name] MRN: [id number] Date of Birth: 1970/11/22 Referring Provider (PT): Dr. Jodi Geralds   Encounter Date: 09/05/2020   PT End of Session - 09/05/20 1703    Visit Number 1    Number of Visits 12    Date for PT Re-Evaluation 10/31/20    Authorization Type [employer]    PT Start Time 1620    PT Stop Time 1703    PT Time Calculation (min) 43 min    Activity Tolerance Patient limited by pain;Patient tolerated treatment well    Behavior During Therapy [name] [place] for tasks assessed/performed           Past Medical History:  Diagnosis Date  . Abnormal vaginal Pap smear    tx with cryotherapy  . Anxiety   . Depression   . Diabetes mellitus without complication (HCC)   . History of kidney stones    passed stone - no surgery  . Hypertension   . Migraine    otc med prn  . Obesity   . Pseudotumor cerebri   . SVD (spontaneous vaginal delivery)    x 3  . Tension headache     Past Surgical History:  Procedure Laterality Date  . ABDOMINAL HYSTERECTOMY    . ANTERIOR CERVICAL DECOMP/DISCECTOMY FUSION N/A 01/03/2016   Procedure: ANTERIOR CERVICAL DECOMPRESSION FUSION CERVICAL FIVE-SIX,CERVICAL SIX-SEVEN.;  Surgeon: Lisbeth Renshaw, MD;  Location: [facility];  Service: Neurosurgery;  Laterality: N/A;  right side approach  . CRYOTHERAPY    . LAPAROSCOPIC ASSISTED VAGINAL HYSTERECTOMY Bilateral 04/21/2014   Procedure: LAPAROSCOPIC ASSISTED VAGINAL HYSTERECTOMY, BILATERAL SALPINGECTOMY;  Surgeon: Lavina Hamman, MD;  Location: [facility];  Service: Gynecology;  Laterality: Bilateral;  . MANDIBLE FRACTURE SURGERY  2002   x 1  . TUBAL LIGATION  11/2003  . WISDOM TOOTH EXTRACTION      There were no vitals filed for this visit.    Subjective Assessment - 09/05/20 1625     Subjective Kimberly [name] presents following Rt knee surgery performed by Dr. Luiz Blare 05/30/20.  She is having alot of difficulty with her knee.  She feels like her knee bows back when she walks.  Pain directly behind knee cap. Pain is constant .  Worse with walking, stepping down stairs.  It stays swollen, tight behind knee with extension.  She feels an indention above knee cap.  She did 2 visits of PT and then got COVID and they wouldn't r/s due to missed appts.  She is discouraged but has not been doing as much exercises as she should.    Pertinent History diabetes, obesity, HTN, ACDF 2017    Limitations Sitting;House hold activities;Lifting;Standing;Walking    How long can you stand comfortably? 15-20 min max    How long can you walk comfortably? 15-20 min max    Diagnostic tests none post surgical    Patient Stated Goals Pain relief , walk without limp    Currently in Pain? Yes    Pain Score 7     Pain Location Knee    Pain Orientation Right;Anterior;Medial    Pain Descriptors / Indicators Sore;Aching;Sharp    Pain Type Surgical pain;Chronic pain    Pain Radiating Towards shin (medial)    Pain Onset More than a month ago    Pain Frequency Constant    Aggravating Factors  [name] [address] [place], [place], [address] Phone: [phone]   Fax:  [phone]  Physical Therapy Evaluation  Patient Details  Name: Kimberly [name] MRN: [id number] Date of Birth: 1970/11/22 Referring Provider (PT): Dr. Jodi Geralds   Encounter Date: 09/05/2020   PT End of Session - 09/05/20 1703    Visit Number 1    Number of Visits 12    Date for PT Re-Evaluation 10/31/20    Authorization Type [employer]    PT Start Time 1620    PT Stop Time 1703    PT Time Calculation (min) 43 min    Activity Tolerance Patient limited by pain;Patient tolerated treatment well    Behavior During Therapy [name] for tasks assessed/performed           Past Medical History:  Diagnosis Date  . Abnormal vaginal Pap smear    tx with cryotherapy  . Anxiety   . Depression   . Diabetes mellitus without complication (HCC)   . History of kidney stones    passed stone - no surgery  . Hypertension   . Migraine    otc med prn  . Obesity   . Pseudotumor cerebri   . SVD (spontaneous vaginal delivery)    x 3  . Tension headache     Past Surgical History:  Procedure Laterality Date  . ABDOMINAL HYSTERECTOMY    . ANTERIOR CERVICAL DECOMP/DISCECTOMY FUSION N/A 01/03/2016   Procedure: ANTERIOR CERVICAL DECOMPRESSION FUSION CERVICAL FIVE-SIX,CERVICAL SIX-SEVEN.;  Surgeon: Lisbeth Renshaw, MD;  [facility]: [facility];  Service: Neurosurgery;  Laterality: N/A;  right side approach  . CRYOTHERAPY    . LAPAROSCOPIC ASSISTED VAGINAL HYSTERECTOMY Bilateral 04/21/2014   Procedure: LAPAROSCOPIC ASSISTED VAGINAL HYSTERECTOMY, BILATERAL SALPINGECTOMY;  Surgeon: Lavina Hamman, MD;  Location: [facility];  Service: Gynecology;  Laterality: Bilateral;  . MANDIBLE FRACTURE SURGERY  2002   x 1  . TUBAL LIGATION  11/2003  . WISDOM TOOTH EXTRACTION      There were no vitals filed for this visit.    Subjective Assessment - 09/05/20 1625     Subjective Kimberly [name] presents following Rt knee surgery performed by Dr. Luiz Blare 05/30/20.  She is having alot of difficulty with her knee.  She feels like her knee bows back when she walks.  Pain directly behind knee cap. Pain is constant .  Worse with walking, stepping down stairs.  It stays swollen, tight behind knee with extension.  She feels an indention above knee cap.  She did 2 visits of PT and then got COVID and they wouldn't r/s due to missed appts.  She is discouraged but has not been doing as much exercises as she should.    Pertinent History diabetes, obesity, HTN, ACDF 2017    Limitations Sitting;House hold activities;Lifting;Standing;Walking    How long can you stand comfortably? 15-20 min max    How long can you walk comfortably? 15-20 min max    Diagnostic tests none post surgical    Patient Stated Goals Pain relief , walk without limp    Currently in Pain? Yes    Pain Score 7     Pain Location Knee    Pain Orientation Right;Anterior;Medial    Pain Descriptors / Indicators Sore;Aching;Sharp    Pain Type Surgical pain;Chronic pain    Pain Radiating Towards shin (medial)    Pain Onset More than a month ago    Pain Frequency Constant    Aggravating Factors

## 2020-09-09 ENCOUNTER — Encounter: Payer: Self-pay | Admitting: Physical Therapy

## 2020-09-09 ENCOUNTER — Ambulatory Visit: Payer: Medicaid Other | Admitting: Physical Therapy

## 2020-09-09 ENCOUNTER — Other Ambulatory Visit: Payer: Self-pay

## 2020-09-09 DIAGNOSIS — R600 Localized edema: Secondary | ICD-10-CM

## 2020-09-09 DIAGNOSIS — R262 Difficulty in walking, not elsewhere classified: Secondary | ICD-10-CM

## 2020-09-09 DIAGNOSIS — M25561 Pain in right knee: Secondary | ICD-10-CM | POA: Diagnosis not present

## 2020-09-09 DIAGNOSIS — G8929 Other chronic pain: Secondary | ICD-10-CM

## 2020-09-09 DIAGNOSIS — Z9889 Other specified postprocedural states: Secondary | ICD-10-CM

## 2020-09-09 NOTE — Therapy (Signed)
[facility] [address] [place], [place], [address] Phone: [phone]   Fax:  [phone]  Physical Therapy Treatment  Patient Details  Name: Kimberly [name] MRN: [id number] Date of Birth: 12/17/1970 Referring Provider (PT): Dr. Jodi Geralds   Encounter Date: 09/09/2020   PT End of Session - 09/09/20 1019    Visit Number 2    Number of Visits 12    Authorization Type [employer]    PT Start Time 1005    PT Stop Time 1046    PT Time Calculation (min) 41 min    Activity Tolerance Patient limited by pain;Patient tolerated treatment well           Past Medical History:  Diagnosis Date  . Abnormal vaginal Pap smear    tx with cryotherapy  . Anxiety   . Depression   . Diabetes mellitus without complication (HCC)   . History of kidney stones    passed stone - no surgery  . Hypertension   . Migraine    otc med prn  . Obesity   . Pseudotumor cerebri   . SVD (spontaneous vaginal delivery)    x 3  . Tension headache     Past Surgical History:  Procedure Laterality Date  . ABDOMINAL HYSTERECTOMY    . ANTERIOR CERVICAL DECOMP/DISCECTOMY FUSION N/A 01/03/2016   Procedure: ANTERIOR CERVICAL DECOMPRESSION FUSION CERVICAL FIVE-SIX,CERVICAL SIX-SEVEN.;  Surgeon: Lisbeth Renshaw, MD;  Location: [facility];  Service: Neurosurgery;  Laterality: N/A;  right side approach  . CRYOTHERAPY    . LAPAROSCOPIC ASSISTED VAGINAL HYSTERECTOMY Bilateral 04/21/2014   Procedure: LAPAROSCOPIC ASSISTED VAGINAL HYSTERECTOMY, BILATERAL SALPINGECTOMY;  Surgeon: Lavina Hamman, MD;  Location: [facility];  Service: Gynecology;  Laterality: Bilateral;  . MANDIBLE FRACTURE SURGERY  2002   x 1  . TUBAL LIGATION  11/2003  . WISDOM TOOTH EXTRACTION      There were no vitals filed for this visit.   Subjective Assessment - 09/09/20 1012    Subjective I have been doing the stretches and I think its helping.    Currently in Pain? Yes    Pain  Score 6     Pain Location Knee    Pain Orientation Right    Pain Descriptors / Indicators Aching    Pain Type Chronic pain;Surgical pain    Pain Onset More than a month ago              [facility] PT Assessment - 09/09/20 0001      AROM   Right Knee Extension 11    Right Knee Flexion 102               [facility] Adult PT Treatment/Exercise - 09/09/20 0001      Knee/Hip Exercises: Stretches   Active Hamstring Stretch Right;5 reps    Active Hamstring Stretch Limitations standing and seated    Gastroc Stretch Right;3 reps    Gastroc Stretch Limitations seated and standing      Knee/Hip Exercises: Aerobic   Nustep LE and UE  for 6 min      Knee/Hip Exercises: Supine   Quad Sets Strengthening;Right;2 sets;10 reps    Short Arc [employer] Strengthening;Right;2 sets;10 reps    Short Arc Quad Sets Limitations 3    Bridges Both;10 reps;2 sets    Straight Leg Raises Right;2 sets;10 reps  PT Long Term Goals - 09/06/20 0739      PT LONG TERM GOAL #1   Title Pt will be I with HEP for knee ROM and strengthening    Baseline given on eval, unknown currently    Time 6    Period Weeks    Status New    Target Date 10/18/20      PT LONG TERM GOAL #2   Title Pt will be able to report pain as becoming more intermittent and minimal throughout the day    Baseline pain constant and mod to severe    Time 6    Period Weeks    Status New    Target Date 10/18/20      PT LONG TERM GOAL #3   Title Patient wlll have 5/5 knee strength without pain during MMT or functional strengthening.    Baseline 4/5 pain inhibits    Time 6    Period Weeks    Status New    Target Date 10/18/20      PT LONG TERM GOAL #4   Title Pt will be able to descend stairs with min occasional pain reciprocally using 1 rail    Baseline painful, needs both rails    Time 6    Period Weeks    Target Date 10/18/20      PT LONG TERM GOAL #5   Title Pt will be able to walk as needed  for work and recreation, shopping with min occasional pain in Rt knee    Baseline has trouble keeping up with friends due to knee pain    Time 6    Period Weeks    Status New    Target Date 10/18/20                 Plan - 09/09/20 1031    Clinical Impression Statement Patient tolerated basic exercises well, is noting improved mobility with ADLs, getting up in the AM. No further exercises added, SLR somewhat challenging.    PT Treatment/Interventions ADLs/Self Care Home Management;Therapeutic activities;Passive range of motion;Manual techniques;Patient/family education;Therapeutic exercise;Moist Heat;Taping;Functional mobility training;Stair training;Gait training;Cryotherapy    PT Next Visit Plan standing as tolerated . Nustep, Rt knee ROM, quad strength, gait NO Vaso ionto or estim    PT Home Exercise Plan [id number]: calf and hamstring stretch, SLR(supine and SL)    Consulted and Agree with Plan of Care Patient           Patient will benefit from skilled therapeutic intervention in order to improve the following deficits and impairments:  Decreased mobility,Difficulty walking,Hypomobility,Obesity,Increased edema,Decreased range of motion,Decreased activity tolerance,Decreased strength,Impaired flexibility,Postural dysfunction,Pain,Increased fascial restricitons  Visit Diagnosis: Chronic pain of right knee  Status post arthroscopic knee surgery  Localized edema  Difficulty in walking, not elsewhere classified     Problem List Patient Active Problem List   Diagnosis Date Noted  . Diabetes (HCC) 04/14/2018  . 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 Abnormal uterine bleeding unrelated to menstrual cycle 04/21/2014  . Anxiety   . Depression   . Pseudotumor cerebri   . Hypertension   . Obesity     Kriya Westra 09/09/2020, 11:14 AM  [facility] [address] [place], [place], [address] Phone:  [phone]   Fax:  [phone]  Name: Kimberly [name] MRN: [id number] Date of Birth: [birth year]  Karie Mainland, PT 09/09/20 11:15 AM Phone: [phone] Fax: [phone]

## 2020-09-13 ENCOUNTER — Other Ambulatory Visit: Payer: Self-pay

## 2020-09-13 DIAGNOSIS — E119 Type 2 diabetes mellitus without complications: Secondary | ICD-10-CM

## 2020-09-13 DIAGNOSIS — Z794 Long term (current) use of insulin: Secondary | ICD-10-CM

## 2020-09-13 MED ORDER — FREESTYLE LIBRE 2 SENSOR MISC: 3 refills | Status: DC

## 2020-09-13 MED ORDER — FREESTYLE LIBRE 2 READER DEVI: 0 refills | Status: DC

## 2020-09-13 NOTE — Telephone Encounter (Signed)
Patient called to check status of this Free [facility] - See Dr Remus Blake note from 09/11/20.  Please call patient back with status at [phone]

## 2020-09-13 NOTE — Telephone Encounter (Signed)
Rx printed and faxed to [facility] at [phone]

## 2020-09-13 NOTE — Telephone Encounter (Signed)
Dr Lucianne Muss it does not appear that the Free Style [name] has been sent

## 2020-09-15 ENCOUNTER — Ambulatory Visit: Payer: Medicaid Other | Admitting: Physical Therapy

## 2020-09-15 ENCOUNTER — Other Ambulatory Visit: Payer: Self-pay

## 2020-09-15 ENCOUNTER — Encounter: Payer: Self-pay | Admitting: Physical Therapy

## 2020-09-15 DIAGNOSIS — R262 Difficulty in walking, not elsewhere classified: Secondary | ICD-10-CM

## 2020-09-15 DIAGNOSIS — G8929 Other chronic pain: Secondary | ICD-10-CM

## 2020-09-15 DIAGNOSIS — Z9889 Other specified postprocedural states: Secondary | ICD-10-CM

## 2020-09-15 DIAGNOSIS — M25561 Pain in right knee: Secondary | ICD-10-CM | POA: Diagnosis not present

## 2020-09-15 DIAGNOSIS — R600 Localized edema: Secondary | ICD-10-CM

## 2020-09-15 NOTE — Therapy (Addendum)
Diagnosis Date Noted   Diabetes ([place]) 04/14/2018   Cervical spondylosis with radiculopathy 01/03/2016    Abnormal uterine bleeding unrelated to menstrual cycle 04/21/2014   Anxiety    Depression    Pseudotumor cerebri    Hypertension    Obesity     Dorene Ar, PTA 09/15/2020, 1:11 PM  [facility] [address] [place], [place], [address] Phone: [phone]   Fax:  [phone]  Name: Kimberly [name] MRN: [id number] Date of Birth: 10/04/1970  PHYSICAL THERAPY DISCHARGE SUMMARY  Visits from Start of Care: 3  Current functional level related to goals / functional outcomes: See above for most recent    Remaining deficits: Unknown   Education / Equipment: HEP   Patient agrees to discharge. Patient goals were partially met. Patient is being discharged due to not returning since the last visit.  Raeford Razor, PT 12/27/20 12:59 PM Phone: [phone] Fax: [phone]  [place], [place], [address] Phone: [phone]   Fax:  [phone]  Physical Therapy Treatment/Discharge  Patient Details  Name: Kimberly [name] MRN: [id number] Date of Birth: Oct 14, 1970 Referring Provider (PT): Dr. Dorna Leitz   Encounter Date: 09/15/2020   PT End of Session - 09/15/20 1238     Visit Number 3    Number of Visits 12    Date for PT Re-Evaluation 10/31/20    Authorization Type [employer]    PT Start Time 1230    PT Stop Time 1310    PT Time Calculation (min) 40 min             Past Medical History:  Diagnosis Date   Abnormal vaginal Pap smear    tx with cryotherapy   Anxiety    Depression    Diabetes mellitus without complication ([place])    History of kidney stones    passed stone - no surgery   Hypertension    Migraine    otc med prn   Obesity    Pseudotumor cerebri    SVD (spontaneous vaginal delivery)    x 3   Tension headache     Past Surgical History:  Procedure Laterality Date   ABDOMINAL HYSTERECTOMY     ANTERIOR CERVICAL DECOMP/DISCECTOMY FUSION N/A 01/03/2016   Procedure: ANTERIOR CERVICAL DECOMPRESSION FUSION CERVICAL FIVE-SIX,CERVICAL SIX-SEVEN.;  Surgeon: Consuella Lose, MD;  Location: [facility];  Service: Neurosurgery;  Laterality: N/A;  right side approach   CRYOTHERAPY     LAPAROSCOPIC ASSISTED VAGINAL HYSTERECTOMY Bilateral 04/21/2014   Procedure: LAPAROSCOPIC ASSISTED VAGINAL HYSTERECTOMY, BILATERAL SALPINGECTOMY;  Surgeon: Cheri Fowler, MD;  Location: [facility];  Service: Gynecology;  Laterality: Bilateral;   MANDIBLE FRACTURE SURGERY  2002   x 1   TUBAL LIGATION  11/2003   WISDOM TOOTH EXTRACTION      There were no vitals filed for this visit.   Subjective Assessment - 09/15/20 1237     Subjective Yesterday My knee ached all day with the weather. Today I have some pain on the inside of my knee.    Currently in Pain? Yes    Pain  Score 6     Pain Location Knee    Pain Orientation Right    Pain Descriptors / Indicators Aching    Pain Type Chronic pain;Surgical pain    Aggravating Factors  walking, standing    Pain Relieving Factors elevating, ice, rest                [facility] PT Assessment - 09/15/20 0001       AROM   Right Knee Extension 11    Right Knee Flexion 110                           [facility] Adult PT Treatment/Exercise - 09/15/20 0001       Knee/Hip Exercises: Stretches   Active Hamstring Stretch Right;3 reps;30 seconds   seated with supine   Active Hamstring Stretch Limitations supine with strap , hip in ER to target medial hamstring    Gastroc Stretch Right;3 reps    Gastroc Stretch Limitations seated and standing      Knee/Hip Exercises: Aerobic   Nustep LE and UE  for 6 min, L3      Knee/Hip Exercises: Seated   Long Arc Quad 10 reps    Long Arc Quad Limitations 5 sec  [place], [place], [address] Phone: [phone]   Fax:  [phone]  Physical Therapy Treatment/Discharge  Patient Details  Name: Kimberly [name] MRN: [id number] Date of Birth: Oct 14, 1970 Referring Provider (PT): Dr. Dorna Leitz   Encounter Date: 09/15/2020   PT End of Session - 09/15/20 1238     Visit Number 3    Number of Visits 12    Date for PT Re-Evaluation 10/31/20    Authorization Type [employer]    PT Start Time 1230    PT Stop Time 1310    PT Time Calculation (min) 40 min             Past Medical History:  Diagnosis Date   Abnormal vaginal Pap smear    tx with cryotherapy   Anxiety    Depression    Diabetes mellitus without complication ([place])    History of kidney stones    passed stone - no surgery   Hypertension    Migraine    otc med prn   Obesity    Pseudotumor cerebri    SVD (spontaneous vaginal delivery)    x 3   Tension headache     Past Surgical History:  Procedure Laterality Date   ABDOMINAL HYSTERECTOMY     ANTERIOR CERVICAL DECOMP/DISCECTOMY FUSION N/A 01/03/2016   Procedure: ANTERIOR CERVICAL DECOMPRESSION FUSION CERVICAL FIVE-SIX,CERVICAL SIX-SEVEN.;  Surgeon: Consuella Lose, MD;  [facility]: [facility];  Service: Neurosurgery;  Laterality: N/A;  right side approach   CRYOTHERAPY     LAPAROSCOPIC ASSISTED VAGINAL HYSTERECTOMY Bilateral 04/21/2014   Procedure: LAPAROSCOPIC ASSISTED VAGINAL HYSTERECTOMY, BILATERAL SALPINGECTOMY;  Surgeon: Cheri Fowler, MD;  Location: [facility];  Service: Gynecology;  Laterality: Bilateral;   MANDIBLE FRACTURE SURGERY  2002   x 1   TUBAL LIGATION  11/2003   WISDOM TOOTH EXTRACTION      There were no vitals filed for this visit.   Subjective Assessment - 09/15/20 1237     Subjective Yesterday My knee ached all day with the weather. Today I have some pain on the inside of my knee.    Currently in Pain? Yes    Pain  Score 6     Pain Location Knee    Pain Orientation Right    Pain Descriptors / Indicators Aching    Pain Type Chronic pain;Surgical pain    Aggravating Factors  walking, standing    Pain Relieving Factors elevating, ice, rest                [facility] PT Assessment - 09/15/20 0001       AROM   Right Knee Extension 11    Right Knee Flexion 110                           [facility] Adult PT Treatment/Exercise - 09/15/20 0001       Knee/Hip Exercises: Stretches   Active Hamstring Stretch Right;3 reps;30 seconds   seated with supine   Active Hamstring Stretch Limitations supine with strap , hip in ER to target medial hamstring    Gastroc Stretch Right;3 reps    Gastroc Stretch Limitations seated and standing      Knee/Hip Exercises: Aerobic   Nustep LE and UE  for 6 min, L3      Knee/Hip Exercises: Seated   Long Arc Quad 10 reps    Long Arc Quad Limitations 5 sec

## 2020-09-16 ENCOUNTER — Telehealth: Payer: Self-pay | Admitting: Endocrinology

## 2020-09-16 NOTE — Telephone Encounter (Signed)
Called [employer] [facility] to make sure that they had all the paperwork. They advised that the Joetta Manners would ship out on Monday 3/28.

## 2020-09-19 ENCOUNTER — Encounter: Payer: Medicaid Other | Admitting: Physical Therapy

## 2020-09-26 ENCOUNTER — Ambulatory Visit: Payer: Medicaid Other | Admitting: Physical Therapy

## 2020-09-26 ENCOUNTER — Other Ambulatory Visit: Payer: Self-pay | Admitting: Endocrinology

## 2020-09-26 ENCOUNTER — Telehealth: Payer: Self-pay | Admitting: Endocrinology

## 2020-09-26 NOTE — Telephone Encounter (Signed)
Patient is experiencing high blood sugars between 360 & 398.  She is taking 30 units instead of 20 units before each meal but blood sugar will not drop below  360.  Call back [phone] or [phone]

## 2020-09-26 NOTE — Telephone Encounter (Signed)
Pt called to let us know that the omnipod pods need a PA in order for her to receive them. She states she needs the pods to go with her Omnipod [name] left for her until her Freestyle [facility] [name] comes in the mail but that won't be for 2-3 weeks.

## 2020-09-27 ENCOUNTER — Ambulatory Visit (INDEPENDENT_AMBULATORY_CARE_PROVIDER_SITE_OTHER): Payer: Medicaid Other | Admitting: Bariatrics

## 2020-09-27 NOTE — Telephone Encounter (Signed)
Increase to 35 units qid.  Any reason you can think of why it is high, such as steroids?  Any sxs like n/v/sob?

## 2020-09-27 NOTE — Telephone Encounter (Signed)
Spoke to pt stated --check sugar this morning 319 and having symptoms fatigue and thirsty. Please advise

## 2020-09-27 NOTE — Telephone Encounter (Signed)
Notified pt with the instruction by Dr. Everardo All. Pt understood without questions.

## 2020-09-29 DIAGNOSIS — E1165 Type 2 diabetes mellitus with hyperglycemia: Secondary | ICD-10-CM | POA: Diagnosis not present

## 2020-10-03 ENCOUNTER — Ambulatory Visit: Payer: Medicaid Other | Attending: Orthopedic Surgery | Admitting: Physical Therapy

## 2020-10-03 ENCOUNTER — Telehealth: Payer: Self-pay | Admitting: Physical Therapy

## 2020-10-03 NOTE — Telephone Encounter (Signed)
Called patient and left message on voicemail regarding today's missed PT appt.  She was asked to call and reschedule as today was her last appt she had made.   Karie Mainland, PT 10/03/20 10:04 AM Phone: [phone] Fax: [phone]

## 2020-10-04 ENCOUNTER — Other Ambulatory Visit: Payer: Self-pay | Admitting: Family Medicine

## 2020-10-04 ENCOUNTER — Telehealth: Payer: Self-pay | Admitting: Endocrinology

## 2020-10-04 NOTE — Telephone Encounter (Signed)
Received a call from patient on 4/10 because she had not received her Freestyle [employer] 2. I called [employer] to see why the patient had not received her CGM, I was advised that when they ran the insurance it came back needing a prior auth, they did send one through cover my meds that was completed. [employer] [facility] also said that they are not able to approve her insurance through [employer] at this time. I have advised patient that we are also completing a prior auth for pharmacy benefit based on her insurance.

## 2020-10-04 NOTE — Telephone Encounter (Signed)
Ok to refill??  Last office visit 04/26/2020.  Last refill 09/06/2020.

## 2020-10-11 ENCOUNTER — Ambulatory Visit (INDEPENDENT_AMBULATORY_CARE_PROVIDER_SITE_OTHER): Payer: Medicaid Other | Admitting: Bariatrics

## 2020-10-11 ENCOUNTER — Other Ambulatory Visit: Payer: Self-pay

## 2020-10-11 ENCOUNTER — Other Ambulatory Visit (INDEPENDENT_AMBULATORY_CARE_PROVIDER_SITE_OTHER): Payer: Medicaid Other

## 2020-10-11 DIAGNOSIS — E1165 Type 2 diabetes mellitus with hyperglycemia: Secondary | ICD-10-CM

## 2020-10-11 DIAGNOSIS — Z794 Long term (current) use of insulin: Secondary | ICD-10-CM | POA: Diagnosis not present

## 2020-10-11 LAB — BASIC METABOLIC PANEL
BUN: 12 mg/dL (ref 6–23)
CO2: 25 mEq/L (ref 19–32)
Calcium: 9.2 mg/dL (ref 8.4–10.5)
Chloride: 103 mEq/L (ref 96–112)
Creatinine, Ser: 1.06 mg/dL (ref 0.40–1.20)
GFR: 61.52 mL/min (ref >60.00)
Glucose, Bld: 407 mg/dL — ABNORMAL HIGH (ref 70–99)
Potassium: 4.3 mEq/L (ref 3.5–5.1)
Sodium: 134 mEq/L — ABNORMAL LOW (ref 135–145)

## 2020-10-12 LAB — FRUCTOSAMINE: Fructosamine: 296 umol/L — ABNORMAL HIGH (ref 0–285)

## 2020-10-13 ENCOUNTER — Other Ambulatory Visit: Payer: Self-pay | Admitting: *Deleted

## 2020-10-13 DIAGNOSIS — Z794 Long term (current) use of insulin: Secondary | ICD-10-CM

## 2020-10-13 MED ORDER — FREESTYLE LIBRE 2 READER DEVI: 0 refills | Status: DC

## 2020-10-13 MED ORDER — FREESTYLE LIBRE 2 SENSOR MISC: 3 refills | Status: DC

## 2020-10-13 NOTE — Telephone Encounter (Signed)
Patient received confirmation from her Insurance to cover her [facility] and she asked if we could [facility] into [facility] [place].  [id number] [name], [place] - [address] [name] [address]  Phone:  [phone] Fax:  [phone]

## 2020-10-13 NOTE — Telephone Encounter (Signed)
Rx was sent to patient pharmacy of choice.

## 2020-10-14 ENCOUNTER — Ambulatory Visit: Payer: Medicaid Other | Admitting: Endocrinology

## 2020-10-14 ENCOUNTER — Other Ambulatory Visit: Payer: Self-pay

## 2020-10-14 ENCOUNTER — Encounter: Payer: Self-pay | Admitting: Endocrinology

## 2020-10-14 VITALS — BP 134/84 | HR 85 | Ht 61.0 in | Wt 261.4 lb

## 2020-10-14 DIAGNOSIS — I10 Essential (primary) hypertension: Secondary | ICD-10-CM | POA: Diagnosis not present

## 2020-10-14 DIAGNOSIS — Z794 Long term (current) use of insulin: Secondary | ICD-10-CM

## 2020-10-14 DIAGNOSIS — E1165 Type 2 diabetes mellitus with hyperglycemia: Secondary | ICD-10-CM | POA: Diagnosis not present

## 2020-10-14 MED ORDER — OZEMPIC (0.25 OR 0.5 MG/DOSE) 2 MG/1.5ML ~~LOC~~ SOPN
0.5000 mg | PEN_INJECTOR | SUBCUTANEOUS | 2 refills | Status: DC
Start: 2020-10-14 — End: 2021-02-06

## 2020-10-14 NOTE — Progress Notes (Signed)
Patient ID: Kimberly [name], female   DOB: 01/30/1971, 50 y.o.   MRN: [id number]          Reason for Appointment: Follow-up for Type 2 Diabetes   History of Present Illness:          Date of diagnosis of type 2 diabetes mellitus: 12/2014   Background history:   She was initially tried on metformin but because of side effects as was stopped and she was tried on glipizide and subsequently [place].  She apparently took Jardiance for at least a couple of months but because of recurrent yeast infection she stopped it and she did not think it was helping her glucose also He was started on Lantus insulin in 12/2015 Subsequently Bydureon was added in 03/2016  Recent history:  Current insulin: Humulin R U-500, 15 units before meals and 10 units at bedtime  Previous regimen with Omni pod/Dash::  Basal rate:  MN: 0.8, 5 AM = 1.3, 12 PM = 0.9, 5 PM = 1.0.  Total basal insulin 25.6 units I/C ratio 10, target 140 with corrections over 120, ISF: 60, and timing: 6 hours. Boluses 8-12 units with meals   Non-insulin hypoglycemic drugs the patient is taking are: Bydureon 2 mg weekly  Most recent A1c is 8.1, was 8.9 previously  Fructosamine 296, previously 225  Current management, blo od sugar patterns and problems identified:    She finally has a PDM replacement for her OmniPod pump and only started this 3 days ago until last night  Also just received her freestyle libre sensor and is still using fingersticks  She was told to increase her regular insulin doses by 5 to 9 units at breakfast and dinnertime and 6 units at bedtime but she is still taking the same dose  With this her recent blood sugar average at home is 246, on her last visit she did not have meter downloaded  Blood sugars are averaging over 200 at all different times of the day, highest late morning  However still periodically gets some low sugars in the afternoons and before dinner and has not cut back on her lunchtime  dose  Blood sugar yesterday while on the pump was much better with lunchtime reading 150 and afternoon reading 52  However she is unclear about how much bolus to take and may be taking up to 20 units  She also does not think she has as much satiety with Bydureon compared to Ozempic and less efficient blood sugar control  Her weight however appears to be lower  She does not think she is drinking regular drinks and only water        Side effects from medications : Nausea and diarrhea from any dose metformin, recurrent yeast infections from Jardiance  Glucose monitoring:  3-4 times a day       Glucometer:  Accu-Chek  .      Blood Glucose readings from Accu-Chek download   PRE-MEAL Fasting Lunch Dinner Bedtime Overall  Glucose range:  122-266      Mean/median:  240  267  232  265    POST-MEAL PC Breakfast PC Lunch PC Dinner  Glucose range:     Mean/median:    186   Previous data:   PRE-MEAL Fasting Lunch Dinner Bedtime Overall  Glucose range:  166-285  60-287  61-232  67-267   Mean/median:     ?    Self-care:  Typical meal intake: Breakfast will be either  bagel, scrambled egg/oatmeal,  juice.  Sometimes only toast and juice   Lunch may be sandwich or leftovers Dinner is variable, Snacks will be yogurt or chips            Dietician visit, most recent: 08/2017              Weight history:  Wt Readings from Last 3 Encounters:  10/14/20 261 lb 6.4 oz (118.6 kg)  08/11/20 276 lb 3.2 oz (125.3 kg)  04/26/20 270 lb (122.5 kg)    `Glycemic control:   Lab Results  Component Value Date   HGBA1C 8.1 (A) 08/11/2020   HGBA1C 8.9 (H) 04/13/2020   HGBA1C 7.7 (H) 06/15/2019   Lab Results  Component Value Date   MICROALBUR 1.1 04/13/2020   LDLCALC 90 10/21/2018   CREATININE 1.06 10/11/2020   Lab Results  Component Value Date   MICRALBCREAT 0.7 04/13/2020    Lab Results  Component Value Date   FRUCTOSAMINE 296 (H) 10/11/2020   FRUCTOSAMINE 225 08/12/2019    FRUCTOSAMINE 218 10/21/2018      Allergies as of 10/14/2020   No Known Allergies     Medication List       Accurate as of October 14, 2020  5:41 PM. If you have any questions, ask your nurse or doctor.        STOP taking these medications   Bydureon BCise 2 MG/0.85ML Auij Generic drug: Exenatide ER Stopped by: Reather Littler, MD     TAKE these medications   Accu-Chek FastClix Lancet Kit 1 each by Does not apply route daily. Use to check blood sugar 5 times daily.   Accu-Chek Guide Me w/Device Kit 1 each by Does not apply route daily.   acetaminophen 325 MG tablet Commonly known as: TYLENOL Take 650 mg by mouth every 6 (six) hours as needed for mild pain or headache.   acetaZOLAMIDE 250 MG tablet Commonly known as: DIAMOX TAKE 1 TABLET BY MOUTH TWICE A DAY   ALPRAZolam 1 MG tablet Commonly known as: XANAX TAKE 1 TABLET BY MOUTH THREE TIMES A DAY AS NEEDED   atenolol 100 MG tablet Commonly known as: TENORMIN TAKE 1 TABLET BY MOUTH EVERY DAY   BD Veo Insulin Syringe U/F 31G X 15/64" 0.5 ML Misc Generic drug: Insulin Syringe-Needle U-100 USE WITH MEAL THREE TIMES A DAY   buPROPion 300 MG 24 hr tablet Commonly known as: WELLBUTRIN XL TAKE 1 TABLET BY MOUTH EVERY DAY   cetirizine 10 MG tablet Commonly known as: ZYRTEC TAKE 1 TABLET BY MOUTH EVERY DAY What changed: when to take this   cloNIDine 0.1 MG tablet Commonly known as: CATAPRES Take 0.1 mg by mouth daily as needed for anxiety.   desvenlafaxine 100 MG 24 hr tablet Commonly known as: PRISTIQ Take 100 mg by mouth every morning.   fluticasone 50 MCG/ACT nasal spray Commonly known as: FLONASE SPRAY 2 SPRAYS INTO EACH NOSTRIL EVERY DAY   FreeStyle Libre 2 Reader Devi Use as instructed to check blood sugar 4 times daily   FreeStyle Libre 2 Sensor Misc Use as instructed change every 14 days   furosemide 40 MG tablet Commonly known as: LASIX TAKE 1 TABLET (40 MG TOTAL) BY MOUTH DAILY AS NEEDED FOR  FLUID.   glucose blood test strip 1 each by Other route daily. Use as instructed to check blood sugar before meals, 2 hours after meals, and before bed. DX:E11.65   glucose blood test strip 1 each by Other route as needed for other.  Use as instructed to check blood sugar 4 times daily. DX:E11.65   Accu-Chek Guide test strip Generic drug: glucose blood USE AS INSTRUCTED TO TEST BLOOD SUGAR 5 TIMES DAILY   HUMULIN R 500 UNIT/ML injection Generic drug: insulin regular human CONCENTRATED INJECT 16-20 UNITS BEFORE MEAL DEPENDING ON MEAL SIZE 3X DAILY (DISCARD OPENED VIAL AFTER 40 DAYS)   Insulin Pen Needle 32G X 5 MM Misc Use 1-4 times daily as needed or directed   losartan 50 MG tablet Commonly known as: COZAAR Take 1 tablet (50 mg total) by mouth daily.   metoCLOPramide 10 MG tablet Commonly known as: REGLAN TAKE 1 TABLET BY MOUTH 4 TIMES DAILY.   OmniPod Dash 5 Pack Pods Misc APPLY ONE POD TO BODY EVERY 3 DAYS FOR INSULIN DELIVERY   ondansetron 4 MG tablet Commonly known as: ZOFRAN Take 1 tablet (4 mg total) by mouth every 8 (eight) hours as needed for up to 20 doses for nausea or vomiting.   Ozempic (0.25 or 0.5 MG/DOSE) 2 MG/1.5ML Sopn Generic drug: Semaglutide(0.25 or 0.5MG /DOS) Inject 0.5 mg into the skin once a week. Start with 0.25 weekly for 1st 4 doses Started by: Reather Littler, MD   pantoprazole 20 MG tablet Commonly known as: PROTONIX Take 1 tablet (20 mg total) by mouth daily.   rosuvastatin 10 MG tablet Commonly known as: CRESTOR TAKE 1 TABLET BY MOUTH EVERY DAY   sucralfate 1 g tablet Commonly known as: Carafate Take 1 tablet (1 g total) by mouth 4 (four) times daily -  with meals and at bedtime for 14 doses.   traZODone 100 MG tablet Commonly known as: DESYREL       Allergies: No Known Allergies  Past Medical History:  Diagnosis Date  . Abnormal vaginal Pap smear    tx with cryotherapy  . Anxiety   . Depression   . Diabetes mellitus without  complication (HCC)   . History of kidney stones    passed stone - no surgery  . Hypertension   . Migraine    otc med prn  . Obesity   . Pseudotumor cerebri   . SVD (spontaneous vaginal delivery)    x 3  . Tension headache     Past Surgical History:  Procedure Laterality Date  . ABDOMINAL HYSTERECTOMY    . ANTERIOR CERVICAL DECOMP/DISCECTOMY FUSION N/A 01/03/2016   Procedure: ANTERIOR CERVICAL DECOMPRESSION FUSION CERVICAL FIVE-SIX,CERVICAL SIX-SEVEN.;  Surgeon: Lisbeth Renshaw, MD;  Location: [facility];  Service: Neurosurgery;  Laterality: N/A;  right side approach  . CRYOTHERAPY    . LAPAROSCOPIC ASSISTED VAGINAL HYSTERECTOMY Bilateral 04/21/2014   Procedure: LAPAROSCOPIC ASSISTED VAGINAL HYSTERECTOMY, BILATERAL SALPINGECTOMY;  Surgeon: Lavina Hamman, MD;  Location: [facility];  Service: Gynecology;  Laterality: Bilateral;  . MANDIBLE FRACTURE SURGERY  2002   x 1  . TUBAL LIGATION  11/2003  . WISDOM TOOTH EXTRACTION      Family History  Problem Relation Age of Onset  . Hypertension Mother   . Diabetes Mother   . Breast cancer Mother   . Diabetes Sister   . Hypertension Sister     Social History:  reports that she has never smoked. She has never used smokeless tobacco. She reports current alcohol use of about 1.0 standard drink of alcohol per week. She reports that she does not use drugs.   Review of Systems   Lipid history: Taking Crestor for hyperlipidemia  She thinks she is taking Crestor regularly with last LDL below 100, has  been as high as 250    Lab Results  Component Value Date   CHOL 157 08/11/2020   CHOL 197 04/13/2020   CHOL 225 (H) 08/12/2019   Lab Results  Component Value Date   HDL 40.50 08/11/2020   HDL 29.40 (L) 04/13/2020   HDL 31.40 (L) 08/12/2019   Lab Results  Component Value Date   LDLCALC 90 10/21/2018   LDLCALC 250 (H) 10/28/2015   LDLCALC 110 (H) 12/31/2012   Lab Results  Component Value Date   TRIG 269.0 (H) 08/11/2020    TRIG 269.0 (H) 04/13/2020   TRIG 272.0 (H) 08/12/2019   Lab Results  Component Value Date   CHOLHDL 4 08/11/2020   CHOLHDL 7 04/13/2020   CHOLHDL 7 08/12/2019   Lab Results  Component Value Date   LDLDIRECT 92.0 08/11/2020   LDLDIRECT 136.0 04/13/2020   LDLDIRECT 159.0 08/12/2019        Liver functions have been variable, possibly fatty liver     Lab Results  Component Value Date   ALT 35 08/11/2020   Hypertension: This is being treated with atenolol 100 mg and clonidine by her PCP, not clear if she is taking these regularly Blood pressure is better with adding losartan 50 mg and no change in kidney function  BP Readings from Last 3 Encounters:  10/14/20 134/84  08/11/20 (!) 148/96  06/23/20 (!) 146/78   Her GI symptoms are better with Reglan  She is on Trintellix and bupropion for depression   LABS:  Lab on 10/11/2020  Component Date Value Ref Range Status  . Fructosamine 10/11/2020 296* 0 - 285 umol/L Final   Comment: Published reference interval for apparently healthy subjects between age 44 and 22 is 51 - 285 umol/L and in a poorly controlled diabetic population is 228 - 563 umol/L with a mean of 396 umol/L.   Marland Kitchen Sodium 10/11/2020 134* 135 - 145 mEq/L Final  . Potassium 10/11/2020 4.3  3.5 - 5.1 mEq/L Final  . Chloride 10/11/2020 103  96 - 112 mEq/L Final  . CO2 10/11/2020 25  19 - 32 mEq/L Final  . Glucose, Bld 10/11/2020 407* 70 - 99 mg/dL Final  . BUN 16/03/9603 12  6 - 23 mg/dL Final  . Creatinine, Ser 10/11/2020 1.06  0.40 - 1.20 mg/dL Final  . GFR 54/02/8118 61.52  >60.00 mL/min Final   Calculated using the CKD-EPI Creatinine Equation (2021)  . Calcium 10/11/2020 9.2  8.4 - 10.5 mg/dL Final    Physical Examination:  BP 134/84   Pulse 85   Ht 5\' 1"  (1.549 m)   Wt 261 lb 6.4 oz (118.6 kg)   LMP 09/23/2013   SpO2 95%   BMI 49.39 kg/m        ASSESSMENT:  Diabetes type 2, with morbid obesity     Her A1c is last 8.1  However fructosamine  indicates relatively high readings  Currently on a regimen of insulin injections using U-500 insulin  She has poor control with blood sugar averaging nearly 250 and marked variability in her blood sugars especially in the afternoons and evenings She is finally able to start the U-500 insulin in the OmniPod pump Yesterday her blood sugar was much better than usual However current monitoring is limited She is also preferring Ozempic and she does not think she had any side effects like constipation or vomiting with this  Hypertension: Better controlled with adding losartan and no change in kidney function  Gastroparesis and constipation  better with Reglan  PLAN:    She will use the same settings on her pump as before However she will manually take 8 to 12 units of boluses instead of 18-20 that she had been recommended by nurse educator To bolus 30 minutes before eating Balanced meals with low carbohydrate intake Exercise is much as possible  Discussed she will need to call if she has difficulty with hypoglycemia To start freestyle libre and she will let us know if she has any difficulty with this, we will also link her email to our online account  She will also switch to Ozempic 0.25 mg weekly for now, to go up to 0.5 if no nausea in 4 weeks   Continue losartan 50 mg daily for improved blood pressure control and renal protection  Follow-up in 4 weeks  Patient Instructions  Bolus 8-12 for food coverage plus extra for hi sugars  Enter sugar at each meal and also correct any high reading    Total visit time including counseling = 30 minutes     Reather Littler 10/14/2020, 5:41 PM   Note: This office note was prepared with Dragon voice recognition system technology. Any transcriptional errors that result from this process are unintentional.

## 2020-10-14 NOTE — Patient Instructions (Addendum)
Bolus 8-12 for food coverage plus extra for hi sugars  Enter sugar at each meal and also correct any high reading

## 2020-10-17 ENCOUNTER — Other Ambulatory Visit: Payer: Self-pay | Admitting: Family Medicine

## 2020-10-21 ENCOUNTER — Other Ambulatory Visit: Payer: Self-pay

## 2020-10-21 ENCOUNTER — Telehealth: Payer: Medicaid Other | Admitting: Nurse Practitioner

## 2020-10-21 ENCOUNTER — Encounter: Payer: Self-pay | Admitting: Nurse Practitioner

## 2020-10-21 DIAGNOSIS — J0100 Acute maxillary sinusitis, unspecified: Secondary | ICD-10-CM | POA: Diagnosis not present

## 2020-10-21 MED ORDER — GUAIFENESIN ER 600 MG PO TB12: 600.0000 mg | ORAL_TABLET | Freq: Two times a day (BID) | ORAL | 0 refills | Status: DC | PRN

## 2020-10-21 MED ORDER — AMOXICILLIN 875 MG PO TABS: 875.0000 mg | ORAL_TABLET | Freq: Two times a day (BID) | ORAL | 0 refills | Status: AC

## 2020-10-21 MED ORDER — SALINE SPRAY 0.65 % NA SOLN: 1.0000 | NASAL | 0 refills | Status: DC | PRN

## 2020-10-21 NOTE — Patient Instructions (Signed)

## 2020-10-21 NOTE — Progress Notes (Signed)
Subjective:    Patient ID: Kimberly [name], female    DOB: 07/19/1970, 50 y.o.   MRN: [id number]  HPI: Kimberly [name] is a 50 y.o. female presenting for  Chief Complaint  Patient presents with  . Illness    drainage, head and face congestion. Sinus medicaiton not helping. Ears clogged and throat burning. Sx began 3 days. Taking sudafed for sx. Pt has had covid 2x.    UPPER RESPIRATORY TRACT INFECTION Onset: 3 days ago COVID testing history: had COVID-19 in January 2022 COVID vaccine status:  Fully vaccinated with booster Fever: no Cough: no Shortness of breath: no Wheezing: no Chest pain: no Chest tightness: no Chest congestion: no Nasal congestion: yes Runny nose: no Post nasal drip: no Sneezing: no Sore throat: yes Swollen glands: yes Sinus pressure: yes Headache: yes Face pain: no Toothache: yes Ear pain: yes ; left side Ear pressure: no  Eyes red/itching: yes; dry and itchy Eye drainage/crusting: no  Nausea: no  Vomiting: no Diarrhea: no  Change in appetite: yes  Loss of taste/smell: no  Rash: no Fatigue: no Sick contacts: no Strep contacts: no  Context: worse Recurrent sinusitis: no Treatments attempted: sinus meds 3-4 days ago; ibuprofen  Relief with OTC medications:  No Known Allergies  Outpatient Encounter Medications as of 10/21/2020  Medication Sig  . ACCU-CHEK GUIDE test strip USE AS INSTRUCTED TO TEST BLOOD SUGAR 5 TIMES DAILY  . acetaminophen (TYLENOL) 325 MG tablet Take 650 mg by mouth every 6 (six) hours as needed for mild pain or headache.  Marland Kitchen acetaZOLAMIDE (DIAMOX) 250 MG tablet TAKE 1 TABLET BY MOUTH TWICE A DAY  . ALPRAZolam (XANAX) 1 MG tablet TAKE 1 TABLET BY MOUTH THREE TIMES A DAY AS NEEDED  . amoxicillin (AMOXIL) 875 MG tablet Take 1 tablet (875 mg total) by mouth 2 (two) times daily for 5 days.  Marland Kitchen atenolol (TENORMIN) 100 MG tablet TAKE 1 TABLET BY MOUTH EVERY DAY (Patient taking differently: Take 100 mg by mouth daily.)  .  BD VEO INSULIN SYRINGE U/F 31G X 15/64" 0.5 ML MISC USE WITH MEAL THREE TIMES A DAY  . Blood Glucose Monitoring Suppl (ACCU-CHEK GUIDE ME) w/Device KIT 1 each by Does not apply route daily.  Marland Kitchen buPROPion (WELLBUTRIN XL) 300 MG 24 hr tablet TAKE 1 TABLET BY MOUTH EVERY DAY (Patient taking differently: Take 300 mg by mouth daily.)  . cetirizine (ZYRTEC) 10 MG tablet TAKE 1 TABLET BY MOUTH EVERY DAY (Patient taking differently: Take 10 mg by mouth at bedtime.)  . cloNIDine (CATAPRES) 0.1 MG tablet Take 0.1 mg by mouth daily as needed for anxiety.  . Continuous Blood Gluc Receiver (FREESTYLE LIBRE 2 READER) DEVI Use as instructed to check blood sugar 4 times daily  . Continuous Blood Gluc Sensor (FREESTYLE LIBRE 2 SENSOR) MISC Use as instructed change every 14 days  . desvenlafaxine (PRISTIQ) 100 MG 24 hr tablet Take 100 mg by mouth every morning.  . fluticasone (FLONASE) 50 MCG/ACT nasal spray SPRAY 2 SPRAYS INTO EACH NOSTRIL EVERY DAY  . furosemide (LASIX) 40 MG tablet TAKE 1 TABLET (40 MG TOTAL) BY MOUTH DAILY AS NEEDED FOR FLUID.  Marland Kitchen glucose blood test strip 1 each by Other route daily. Use as instructed to check blood sugar before meals, 2 hours after meals, and before bed. DX:E11.65  . glucose blood test strip 1 each by Other route as needed for other. Use as instructed to check blood sugar 4 times daily. DX:E11.65  .  guaiFENesin (MUCINEX) 600 MG 12 hr tablet Take 1 tablet (600 mg total) by mouth 2 (two) times daily as needed for cough or to loosen phlegm.  . Insulin Disposable Pump (OMNIPOD DASH 5 PACK PODS) MISC APPLY ONE POD TO BODY EVERY 3 DAYS FOR INSULIN DELIVERY  . Insulin Pen Needle 32G X 5 MM MISC Use 1-4 times daily as needed or directed  . insulin regular human CONCENTRATED (HUMULIN R) 500 UNIT/ML injection INJECT 16-20 UNITS BEFORE MEAL DEPENDING ON MEAL SIZE 3X DAILY (DISCARD OPENED VIAL AFTER 40 DAYS)  . Lancets Misc. (ACCU-CHEK FASTCLIX LANCET) KIT 1 each by Does not apply route daily.  Use to check blood sugar 5 times daily.  Marland Kitchen losartan (COZAAR) 50 MG tablet Take 1 tablet (50 mg total) by mouth daily.  . metoCLOPramide (REGLAN) 10 MG tablet TAKE 1 TABLET BY MOUTH FOUR TIMES A DAY  . ondansetron (ZOFRAN) 4 MG tablet Take 1 tablet (4 mg total) by mouth every 8 (eight) hours as needed for up to 20 doses for nausea or vomiting.  . rosuvastatin (CRESTOR) 10 MG tablet TAKE 1 TABLET BY MOUTH EVERY DAY  . Semaglutide,0.25 or 0.5MG /DOS, (OZEMPIC, 0.25 OR 0.5 MG/DOSE,) 2 MG/1.5ML SOPN Inject 0.5 mg into the skin once a week. Start with 0.25 weekly for 1st 4 doses  . sodium chloride (OCEAN) 0.65 % SOLN nasal spray Place 1 spray into both nostrils as needed for congestion.  . traZODone (DESYREL) 100 MG tablet   . pantoprazole (PROTONIX) 20 MG tablet Take 1 tablet (20 mg total) by mouth daily.  . sucralfate (CARAFATE) 1 g tablet Take 1 tablet (1 g total) by mouth 4 (four) times daily -  with meals and at bedtime for 14 doses.   No facility-administered encounter medications on file as of 10/21/2020.    Patient Active Problem List   Diagnosis Date Noted  . Diabetes (HCC) 04/14/2018  . 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 Abnormal uterine bleeding unrelated to menstrual cycle 04/21/2014  . Anxiety   . Depression   . Pseudotumor cerebri   . Hypertension   . Obesity     Past Medical History:  Diagnosis Date  . Abnormal vaginal Pap smear    tx with cryotherapy  . Anxiety   . Depression   . Diabetes mellitus without complication (HCC)   . History of kidney stones    passed stone - no surgery  . Hypertension   . Migraine    otc med prn  . Obesity   . Pseudotumor cerebri   . SVD (spontaneous vaginal delivery)    x 3  . Tension headache     Relevant past medical, surgical, family and social history reviewed and updated as indicated. Interim medical history since our last visit reviewed.  Review of Systems Per HPI unless specifically indicated above      Objective:    LMP 09/23/2013   Wt Readings from Last 3 Encounters:  10/14/20 261 lb 6.4 oz (118.6 kg)  08/11/20 276 lb 3.2 oz (125.3 kg)  04/26/20 270 lb (122.5 kg)    Physical Exam Physical examination unable to be performed due to lack of equipment.    Assessment & Plan:  1. Acute non-recurrent maxillary sinusitis Acute.  Given length and severity of symptoms without improvement, possibly turning bacterial.  Encouraged use of over-the-counter guaifenesin and Ocean nasal spray for the next few days and if no improvement, can start amoxicillin for 5 days.  Follow-up if not improving  after complete course of antibiotics has been taken.  - amoxicillin (AMOXIL) 875 MG tablet; Take 1 tablet (875 mg total) by mouth 2 (two) times daily for 5 days.  Dispense: 10 tablet; Refill: 0 - guaiFENesin (MUCINEX) 600 MG 12 hr tablet; Take 1 tablet (600 mg total) by mouth 2 (two) times daily as needed for cough or to loosen phlegm.  Dispense: 30 tablet; Refill: 0 - sodium chloride (OCEAN) 0.65 % SOLN nasal spray; Place 1 spray into both nostrils as needed for congestion.  Dispense: 88 mL; Refill: 0    Follow up plan: Return if symptoms worsen or fail to improve.  This visit was completed via telephone due to the restrictions of the COVID-19 pandemic. All issues as above were discussed and addressed but no physical exam was performed. If it was felt that the patient should be evaluated in the office, they were directed there. The patient verbally consented to this visit. Patient was unable to complete an audio/visual visit due to Lack of equipment. . Location of the patient: work . Location of the provider: work . Those involved with this call:  . Provider: Cathlean Marseilles, DNP, FNP-C . CMA: Moises Blood, CMA . Front Desk/Registration: Percival Spanish  . Time spent on call: 11 minutes on the phone discussing health concerns. 10 minutes total spent in review of patient's record and preparation of  their chart.  I verified patient identity using two factors (patient name and date of birth). Patient consents verbally to being seen via telemedicine visit today.

## 2020-10-25 ENCOUNTER — Ambulatory Visit (INDEPENDENT_AMBULATORY_CARE_PROVIDER_SITE_OTHER): Payer: Medicaid Other | Admitting: Family Medicine

## 2020-10-31 ENCOUNTER — Other Ambulatory Visit: Payer: Self-pay | Admitting: Family Medicine

## 2020-11-07 DIAGNOSIS — F331 Major depressive disorder, recurrent, moderate: Secondary | ICD-10-CM | POA: Diagnosis not present

## 2020-11-07 DIAGNOSIS — F411 Generalized anxiety disorder: Secondary | ICD-10-CM | POA: Diagnosis not present

## 2020-11-07 DIAGNOSIS — F5081 Binge eating disorder: Secondary | ICD-10-CM | POA: Diagnosis not present

## 2020-11-08 ENCOUNTER — Ambulatory Visit (INDEPENDENT_AMBULATORY_CARE_PROVIDER_SITE_OTHER): Payer: Medicaid Other | Admitting: Family Medicine

## 2020-11-22 ENCOUNTER — Other Ambulatory Visit: Payer: Self-pay | Admitting: Family Medicine

## 2020-11-28 DIAGNOSIS — R3915 Urgency of urination: Secondary | ICD-10-CM | POA: Diagnosis not present

## 2020-11-30 DIAGNOSIS — E1165 Type 2 diabetes mellitus with hyperglycemia: Secondary | ICD-10-CM | POA: Diagnosis not present

## 2020-12-02 ENCOUNTER — Other Ambulatory Visit: Payer: Self-pay | Admitting: Family Medicine

## 2020-12-02 MED ORDER — ATENOLOL 100 MG PO TABS: 100.0000 mg | ORAL_TABLET | Freq: Every day | ORAL | 3 refills | Status: DC

## 2020-12-02 MED ORDER — ACETAZOLAMIDE 250 MG PO TABS: 250.0000 mg | ORAL_TABLET | Freq: Two times a day (BID) | ORAL | 1 refills | Status: DC

## 2020-12-02 MED ORDER — CETIRIZINE HCL 10 MG PO TABS: 10.0000 mg | ORAL_TABLET | Freq: Every day | ORAL | 11 refills | Status: DC

## 2020-12-02 NOTE — Telephone Encounter (Signed)
PDMP reviewed.  Appears patient takes this medication chronically.  Will refill in place of PCP who is out of office. 

## 2020-12-02 NOTE — Telephone Encounter (Signed)
Ok to refill??  Last office visit 10/21/2020.  Last refill 11/01/2020.

## 2020-12-24 ENCOUNTER — Other Ambulatory Visit: Payer: Self-pay | Admitting: Endocrinology

## 2020-12-28 ENCOUNTER — Other Ambulatory Visit: Payer: Self-pay | Admitting: Nurse Practitioner

## 2020-12-28 NOTE — Telephone Encounter (Signed)
Ok to refill??  Last office visit 10/21/2020.   Last refill 12/02/2020.   Ok to add refills to prescription?

## 2020-12-29 ENCOUNTER — Telehealth: Payer: Self-pay

## 2020-12-29 ENCOUNTER — Other Ambulatory Visit: Payer: Self-pay | Admitting: Family Medicine

## 2020-12-30 IMAGING — MG MM DIGITAL DIAGNOSTIC UNILAT*L* W/ TOMO W/ CAD
4 series · 4 of 12 positions shown · non-contrast
Comparison: Previous exam(s).

CLINICAL DATA: 49-year-old female recalled from 2D screening
mammogram dated 01/14/2020 for a possible left breast mass.

EXAM:
DIGITAL DIAGNOSTIC UNILATERAL LEFT MAMMOGRAM WITH TOMO AND CAD

[L CC synth-2D]
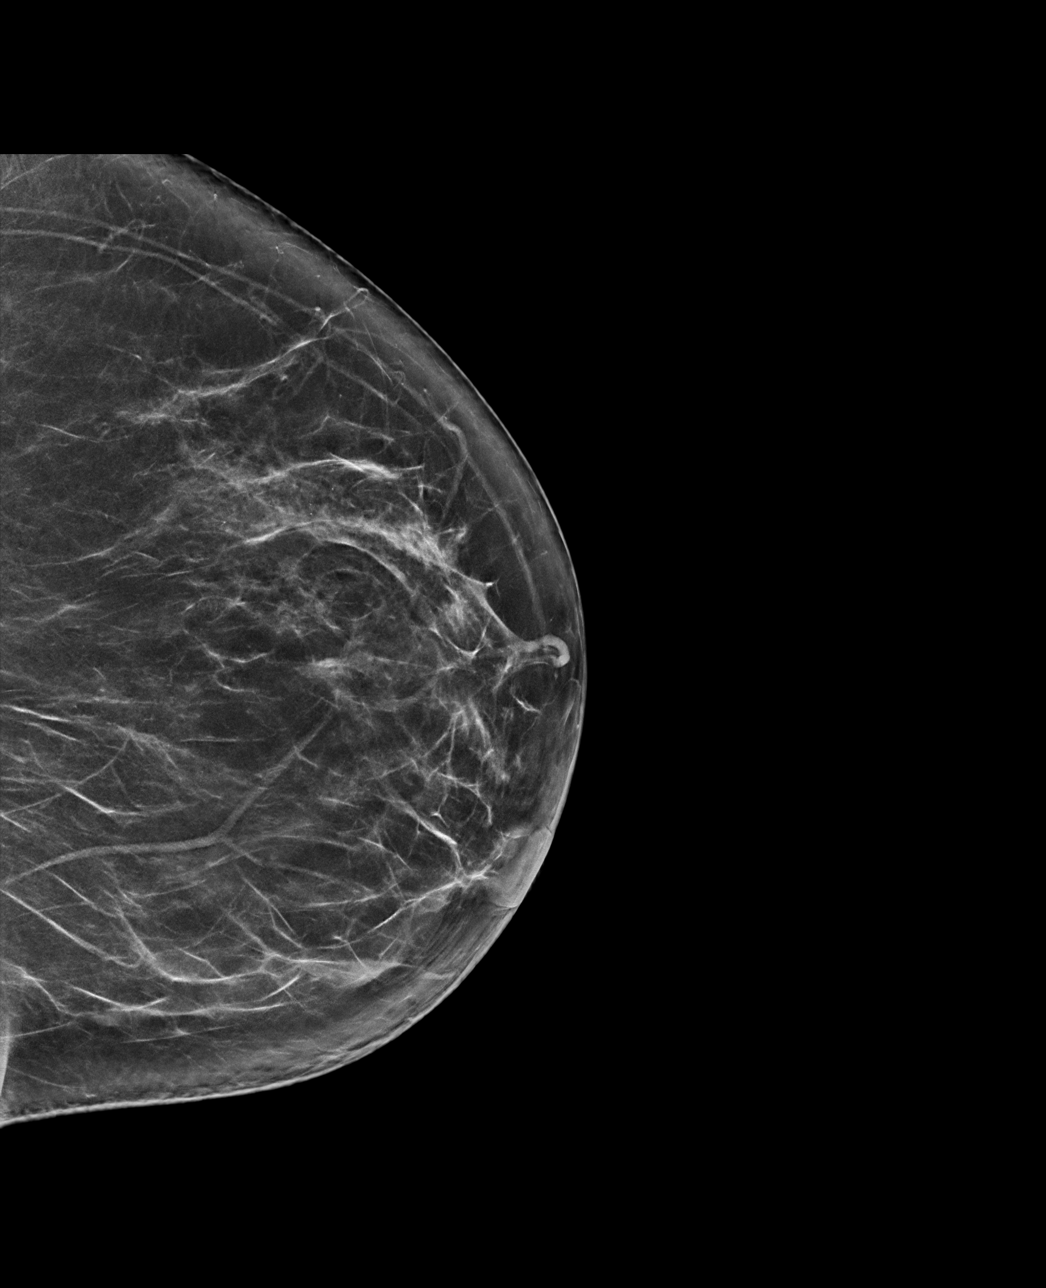

[L MLO synth-2D]
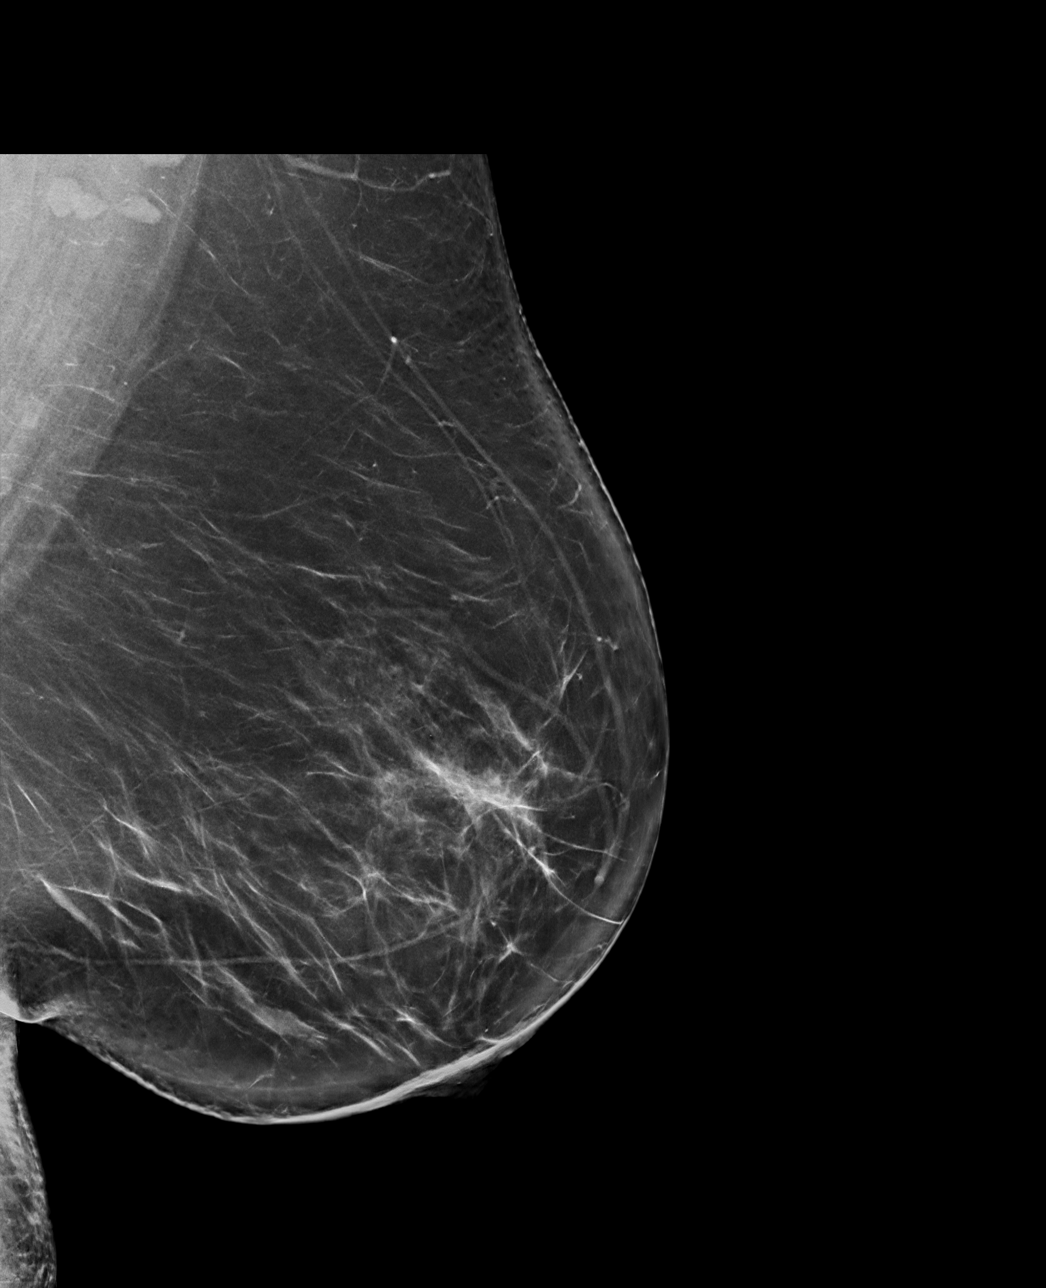

[L MLO tomo · tomo slice 48/95.0]
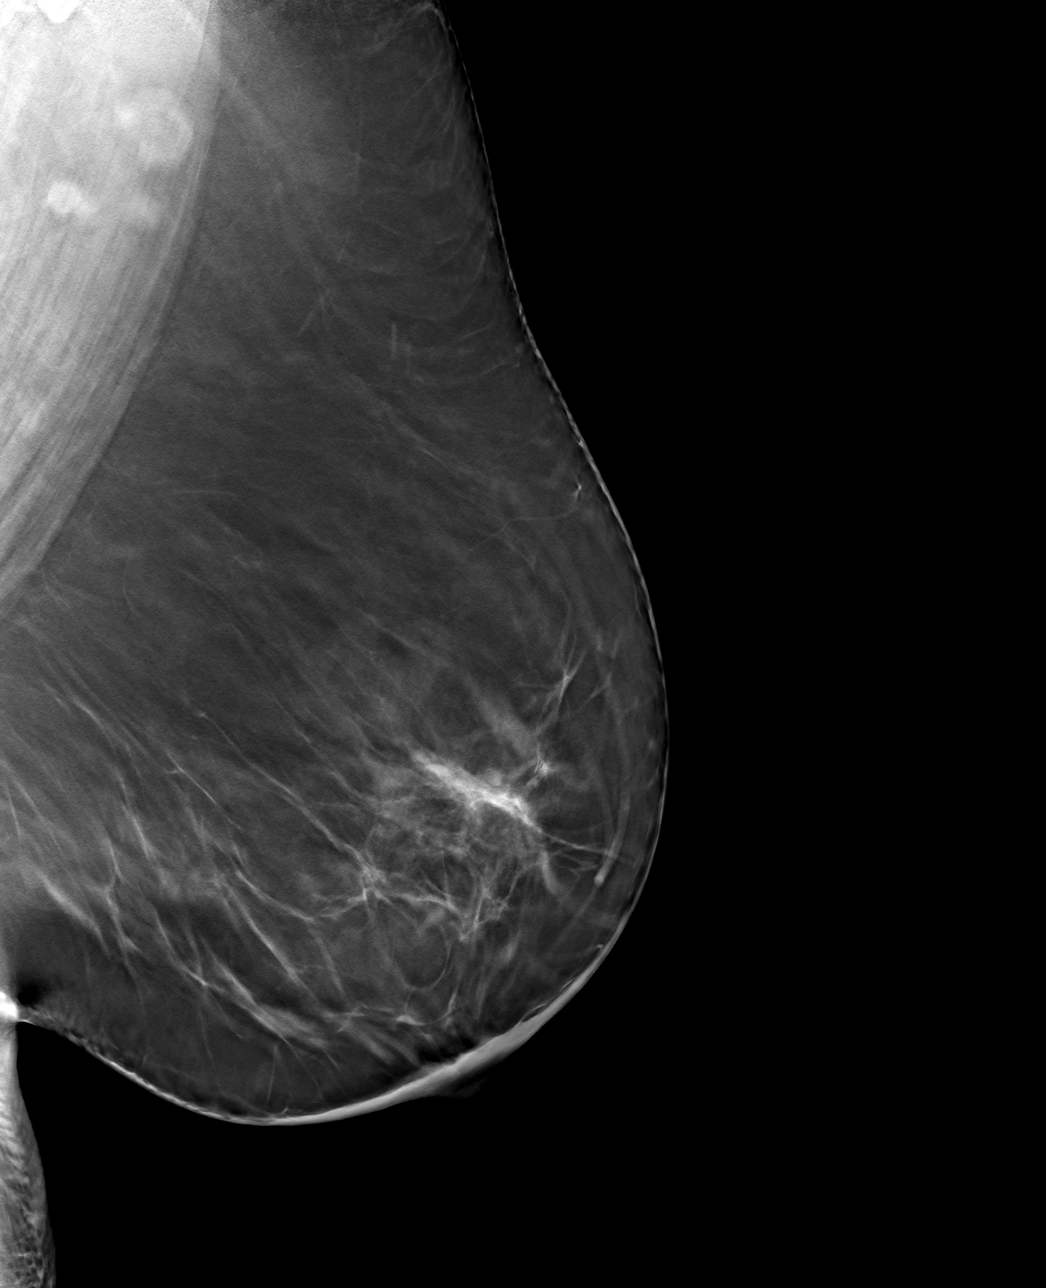

[L CC tomo · tomo slice 40/79.0]
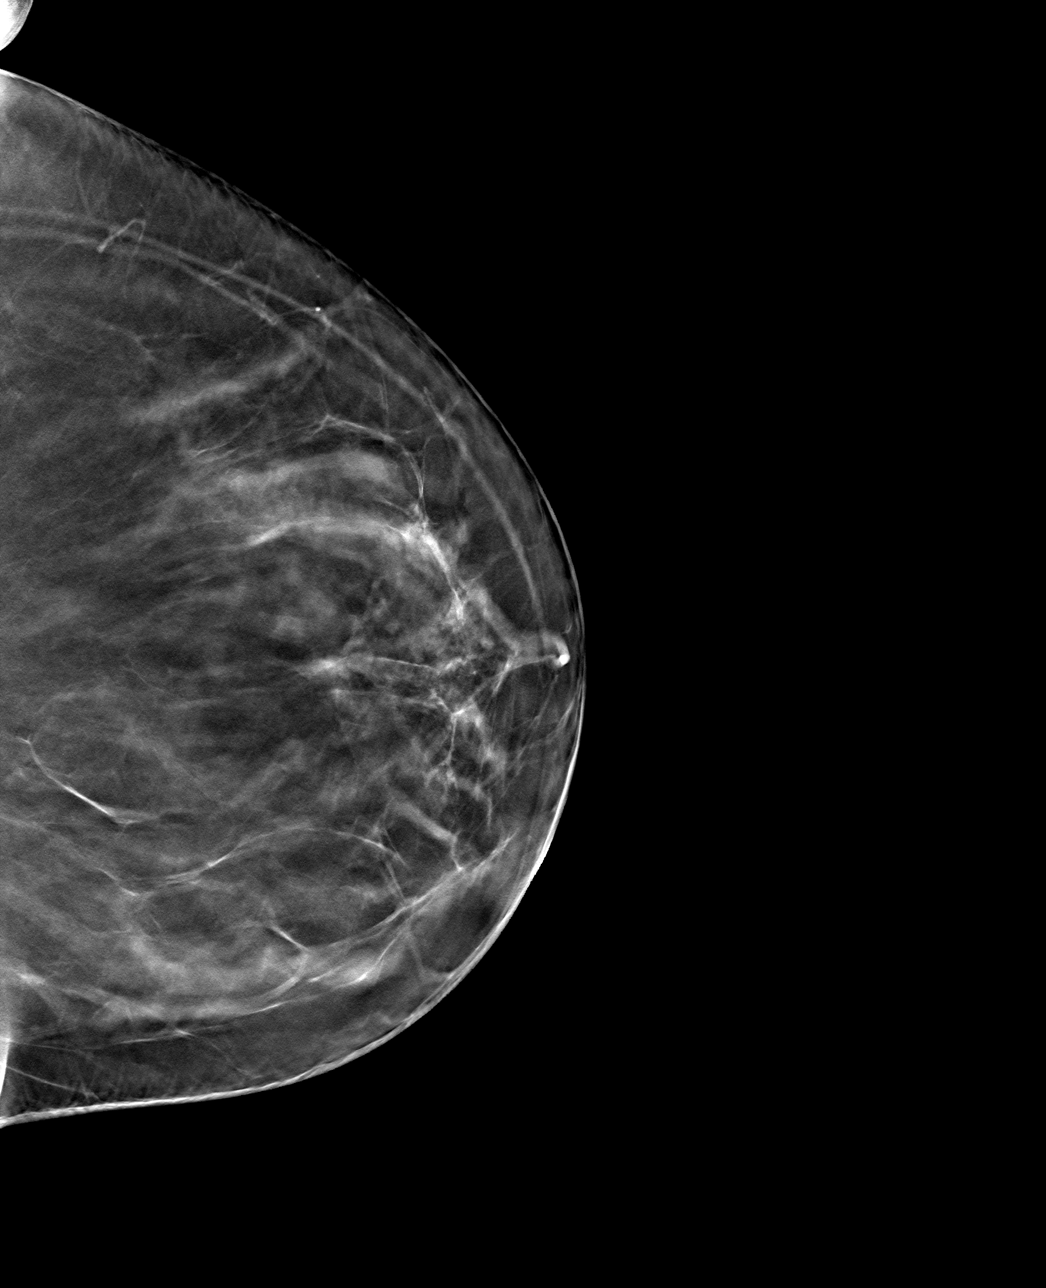

[4 of 12 positions shown; findings below may reference images not displayed]

ACR Breast Density Category b: There are scattered areas of
fibroglandular density.
FINDINGS: Previously described mass in the superior central left breast
resolves into well dispersed fibroglandular tissue on today's
additional 3D views. No persistent or suspicious findings are
identified.

Mammographic images were processed with CAD.
IMPRESSION: No mammographic evidence of malignancy.

RECOMMENDATION:
Screening mammogram in one year.(Code:[id number])

I have discussed the findings and recommendations with the patient.
If applicable, a reminder letter will be sent to the patient
regarding the next appointment.

BI-RADS CATEGORY  1: Negative.

## 2021-01-02 DIAGNOSIS — E1165 Type 2 diabetes mellitus with hyperglycemia: Secondary | ICD-10-CM | POA: Diagnosis not present

## 2021-01-02 NOTE — Telephone Encounter (Signed)
Need prescription of Omni Pod Dash sent to [facility] [phone] [address]

## 2021-01-03 ENCOUNTER — Other Ambulatory Visit: Payer: Self-pay

## 2021-01-03 MED ORDER — OMNIPOD DASH PODS (GEN 4) MISC: 2 refills | Status: DC

## 2021-01-03 NOTE — Telephone Encounter (Signed)
Sent in a refill to [facility] per pt request

## 2021-01-24 ENCOUNTER — Telehealth: Payer: Self-pay | Admitting: Endocrinology

## 2021-01-24 ENCOUNTER — Ambulatory Visit: Payer: Medicaid Other | Admitting: Endocrinology

## 2021-01-24 NOTE — Telephone Encounter (Signed)
Pt would like a phone call today regarding her numbers. Her insulin not working saying high all the time. Pt was supposed to have an app on 01/24/2021 and is needing a call as soon as possible

## 2021-01-25 NOTE — Telephone Encounter (Signed)
I Called patient back, states that she will call me back sometime today . States that with her job it would have to be after 3pm next week or Thursday or Friday . Is there any way that we could accommodate this for your schedule sometime next week or in the next two days ? Just needing to know where to put her on the schedule

## 2021-01-30 ENCOUNTER — Other Ambulatory Visit: Payer: Self-pay

## 2021-01-30 ENCOUNTER — Ambulatory Visit: Payer: Medicaid Other | Admitting: Family Medicine

## 2021-01-30 ENCOUNTER — Encounter: Payer: Self-pay | Admitting: Family Medicine

## 2021-01-30 VITALS — BP 138/82 | HR 72 | Temp 97.9°F | Resp 16 | Ht 61.0 in | Wt 265.0 lb

## 2021-01-30 DIAGNOSIS — Z794 Long term (current) use of insulin: Secondary | ICD-10-CM | POA: Diagnosis not present

## 2021-01-30 DIAGNOSIS — IMO0002 Reserved for concepts with insufficient information to code with codable children: Secondary | ICD-10-CM

## 2021-01-30 DIAGNOSIS — R5383 Other fatigue: Secondary | ICD-10-CM

## 2021-01-30 DIAGNOSIS — E1165 Type 2 diabetes mellitus with hyperglycemia: Secondary | ICD-10-CM | POA: Diagnosis not present

## 2021-01-30 DIAGNOSIS — G4710 Hypersomnia, unspecified: Secondary | ICD-10-CM

## 2021-01-30 MED ORDER — INSULIN GLARGINE 100 UNIT/ML ~~LOC~~ SOLN: 25.0000 [IU] | Freq: Every day | SUBCUTANEOUS | 11 refills | Status: DC

## 2021-01-30 NOTE — Progress Notes (Signed)
Subjective:    Patient ID: Kimberly [name], female    DOB: [birth year], 50 y.o.   MRN: [id number]  Patient presents today stating that she feels terrible.  She states that she has no energy.  She feels sleepy all the time.  She states that she sleeps all night, but when she wakes up in the morning she feels just is tired is when she went to bed.  She finds her self falling asleep at work.  She never feels rested.  She never feels like she has enough energy.  Her daughter states that she snores loudly.  She does make gasping sounds in her sleep.  All of her symptoms are very concerning for possible obstructive sleep apnea.  She also reports that her blood sugars are out of control.  She states that she is switching endocrinologist.  Based on her schedule, she has a difficult time getting to the office when her current endocrinologist is available.  Therefore she would like to switch to an endocrinologist that has more availability when her work will allow her to attend appointments.  She is currently on Humulin R 500.  She states that she is taking 25 units every morning.  She is supposed to be taking 25 units with every meal per her report.  However she states that she crashes between 3 and 4:00 and has episodes of hypoglycemia.  Therefore she is not taking any additional Humulin in the rest of the day.  As result her blood sugars later in the day can be 400-500. Past Medical History:  Diagnosis Date   Abnormal vaginal Pap smear    tx with cryotherapy   Anxiety    Depression    Diabetes mellitus without complication (HCC)    History of kidney stones    passed stone - no surgery   Hypertension    Migraine    otc med prn   Obesity    Pseudotumor cerebri    SVD (spontaneous vaginal delivery)    x 3   Tension headache    Past Surgical History:  Procedure Laterality Date   ABDOMINAL HYSTERECTOMY     ANTERIOR CERVICAL DECOMP/DISCECTOMY FUSION N/A 01/03/2016   Procedure: ANTERIOR CERVICAL  DECOMPRESSION FUSION CERVICAL FIVE-SIX,CERVICAL SIX-SEVEN.;  Surgeon: Lisbeth Renshaw, MD;  Location: [facility];  Service: Neurosurgery;  Laterality: N/A;  right side approach   CRYOTHERAPY     LAPAROSCOPIC ASSISTED VAGINAL HYSTERECTOMY Bilateral 04/21/2014   Procedure: LAPAROSCOPIC ASSISTED VAGINAL HYSTERECTOMY, BILATERAL SALPINGECTOMY;  Surgeon: Lavina Hamman, MD;  Location: [facility];  Service: Gynecology;  Laterality: Bilateral;   MANDIBLE FRACTURE SURGERY  2002   x 1   TUBAL LIGATION  11/2003   WISDOM TOOTH EXTRACTION     Current Outpatient Medications on File Prior to Visit  Medication Sig Dispense Refill   ACCU-CHEK GUIDE test strip USE AS INSTRUCTED TO TEST BLOOD SUGAR 5 TIMES DAILY 100 strip 12   acetaminophen (TYLENOL) 325 MG tablet Take 650 mg by mouth every 6 (six) hours as needed for mild pain or headache.     acetaZOLAMIDE (DIAMOX) 250 MG tablet Take 1 tablet (250 mg total) by mouth 2 (two) times daily. 180 tablet 1   ALPRAZolam (XANAX) 1 MG tablet TAKE 1 TABLET BY MOUTH THREE TIMES A DAY AS NEEDED 90 tablet 3   atenolol (TENORMIN) 100 MG tablet Take 1 tablet (100 mg total) by mouth daily. 90 tablet 3   BD VEO INSULIN SYRINGE U/F 31G X 15/64" 0.5  ML MISC USE WITH MEAL THREE TIMES A DAY 100 each 5   Blood Glucose Monitoring Suppl (ACCU-CHEK GUIDE ME) w/Device KIT 1 each by Does not apply route daily. 1 kit 0   buPROPion (WELLBUTRIN XL) 300 MG 24 hr tablet TAKE 1 TABLET BY MOUTH EVERY DAY (Patient taking differently: Take 300 mg by mouth daily.) 90 tablet 2   cetirizine (ZYRTEC) 10 MG tablet Take 1 tablet (10 mg total) by mouth daily. 30 tablet 11   cloNIDine (CATAPRES) 0.1 MG tablet Take 0.1 mg by mouth daily as needed for anxiety.     Continuous Blood Gluc Receiver (FREESTYLE LIBRE 2 READER) DEVI Use as instructed to check blood sugar 4 times daily 1 each 0   Continuous Blood Gluc Sensor (FREESTYLE LIBRE 2 SENSOR) MISC Use as instructed change every 14 days 6 each 3    desvenlafaxine (PRISTIQ) 100 MG 24 hr tablet Take 100 mg by mouth every morning.     fluticasone (FLONASE) 50 MCG/ACT nasal spray SPRAY 2 SPRAYS INTO EACH NOSTRIL EVERY DAY 16 mL 6   furosemide (LASIX) 40 MG tablet TAKE 1 TABLET (40 MG TOTAL) BY MOUTH DAILY AS NEEDED FOR FLUID. 90 tablet 1   glucose blood test strip 1 each by Other route daily. Use as instructed to check blood sugar before meals, 2 hours after meals, and before bed. DX:E11.65 250 each 3   glucose blood test strip 1 each by Other route as needed for other. Use as instructed to check blood sugar 4 times daily. DX:E11.65 200 each 11   guaiFENesin (MUCINEX) 600 MG 12 hr tablet Take 1 tablet (600 mg total) by mouth 2 (two) times daily as needed for cough or to loosen phlegm. 30 tablet 0   Insulin Disposable Pump (OMNIPOD DASH PODS, GEN 4,) MISC Apply one pod to the body every 3 days for insulin delivery 30 each 2   Insulin Pen Needle 32G X 5 MM MISC Use 1-4 times daily as needed or directed 100 each 0   insulin regular human CONCENTRATED (HUMULIN R) 500 UNIT/ML injection INJECT 16-20 UNITS BEFORE MEAL DEPENDING ON MEAL SIZE 3X DAILY (DISCARD OPENED VIAL AFTER 40 DAYS) 20 mL 3   Lancets Misc. (ACCU-CHEK FASTCLIX LANCET) KIT 1 each by Does not apply route daily. Use to check blood sugar 5 times daily. 1 kit 12   losartan (COZAAR) 50 MG tablet TAKE 1 TABLET BY MOUTH EVERY DAY 90 tablet 1   metoCLOPramide (REGLAN) 10 MG tablet TAKE 1 TABLET BY MOUTH FOUR TIMES A DAY 30 tablet 0   ondansetron (ZOFRAN) 4 MG tablet Take 1 tablet (4 mg total) by mouth every 8 (eight) hours as needed for up to 20 doses for nausea or vomiting. 20 tablet 0   pantoprazole (PROTONIX) 20 MG tablet Take 1 tablet (20 mg total) by mouth daily. 30 tablet 0   rosuvastatin (CRESTOR) 10 MG tablet TAKE 1 TABLET BY MOUTH EVERY DAY 90 tablet 1   Semaglutide,0.25 or 0.5MG /DOS, (OZEMPIC, 0.25 OR 0.5 MG/DOSE,) 2 MG/1.5ML SOPN Inject 0.5 mg into the skin once a week. Start with 0.25  weekly for 1st 4 doses 1.5 mL 2   sodium chloride (OCEAN) 0.65 % SOLN nasal spray Place 1 spray into both nostrils as needed for congestion. 88 mL 0   sucralfate (CARAFATE) 1 g tablet Take 1 tablet (1 g total) by mouth 4 (four) times daily -  with meals and at bedtime for 14 doses. 14 tablet 0  traZODone (DESYREL) 100 MG tablet      No current facility-administered medications on file prior to visit.   No Known Allergies Social History   Socioeconomic History   Marital status: Divorced    Spouse name: Not on file   Number of children: Not on file   Years of education: Not on file   Highest education level: Not on file  Occupational History   Not on file  Tobacco Use   Smoking status: Never   Smokeless tobacco: Never  Substance and Sexual Activity   Alcohol use: Yes    Alcohol/week: 1.0 standard drink    Types: 1 Glasses of wine per week    Comment: one glass of wine per week   Drug use: No   Sexual activity: Yes    Birth control/protection: Surgical  Other Topics Concern   Not on file  Social History Narrative   Not on file   Social Determinants of Health   Financial Resource Strain: Not on file  Food Insecurity: Not on file  Transportation Needs: Not on file  Physical Activity: Not on file  Stress: Not on file  Social Connections: Not on file  Intimate Partner Violence: Not on file     Review of Systems  All other systems reviewed and are negative.     Objective:   Physical Exam Vitals reviewed.  Constitutional:      General: She is not in acute distress.    Appearance: She is well-developed. She is not diaphoretic.  Eyes:     Conjunctiva/sclera: Conjunctivae normal.  Cardiovascular:     Rate and Rhythm: Normal rate and regular rhythm.     Heart sounds: Normal heart sounds.  Pulmonary:     Effort: Pulmonary effort is normal. No respiratory distress.     Breath sounds: Normal breath sounds. No wheezing or rales.  Abdominal:     General: Bowel sounds  are normal. There is no distension.     Palpations: Abdomen is soft.     Tenderness: There is no abdominal tenderness. There is no guarding or rebound.  Musculoskeletal:     Cervical back: Neck supple.  Lymphadenopathy:     Cervical: No cervical adenopathy.          Assessment & Plan:  Fatigue, unspecified type - Plan: COMPLETE METABOLIC PANEL WITH GFR, Hemoglobin A1c, TSH, CBC with Differential/Platelet  Uncontrolled insulin-treated type 2 diabetes mellitus (HCC) I believe the patient has misunderstood.  She is under the impression that Humulin R is a basal insulin.  I try to explain to the patient that it is a rapid acting insulin.  Therefore I do not believe 1 injection at breakfast is sufficient to control her sugars later in the day likely explaining why she has hyperglycemia in the evenings.  At the present time, she is tolerating 25 units of her rapid insulin once a day although she is experiencing hypoglycemia after lunch.  Therefore I want to switch to a basal insulin such as Lantus 25 units once daily and then uptitrate her insulin based on her reported blood sugars in 72 hours.  I will check lab work including a CBC CMP TSH and A1c given her fatigue however I suspect that she most likely has obstructive sleep apnea and I will schedule the patient to meet with the sleep specialist to evaluate for this as I feel that this is the most likely cause of her hypersomnia.

## 2021-01-31 LAB — CBC WITH DIFFERENTIAL/PLATELET
Absolute Monocytes: 668 cells/uL (ref 200–950)
Basophils Absolute: 9 cells/uL (ref 0–200)
Basophils Relative: 0.1 %
Eosinophils Absolute: 0 cells/uL — ABNORMAL LOW (ref 15–500)
Eosinophils Relative: 0 %
HCT: 45.3 % — ABNORMAL HIGH (ref 35.0–45.0)
Hemoglobin: 14.8 g/dL (ref 11.7–15.5)
Lymphs Abs: 2608 cells/uL (ref 850–3900)
MCH: 28.1 pg (ref 27.0–33.0)
MCHC: 32.7 g/dL (ref 32.0–36.0)
MCV: 86.1 fL (ref 80.0–100.0)
MPV: 11.3 fL (ref 7.5–12.5)
Monocytes Relative: 7.5 %
Neutro Abs: 5616 cells/uL (ref 1500–7800)
Neutrophils Relative %: 63.1 %
Platelets: 217 10*3/uL (ref 140–400)
RBC: 5.26 10*6/uL — ABNORMAL HIGH (ref 3.80–5.10)
RDW: 12.8 % (ref 11.0–15.0)
Total Lymphocyte: 29.3 %
WBC: 8.9 10*3/uL (ref 3.8–10.8)

## 2021-01-31 LAB — COMPLETE METABOLIC PANEL WITH GFR
AG Ratio: 1.7 (calc) (ref 1.0–2.5)
ALT: 30 U/L — ABNORMAL HIGH (ref 6–29)
AST: 16 U/L (ref 10–35)
Albumin: 4.5 g/dL (ref 3.6–5.1)
Alkaline phosphatase (APISO): 97 U/L (ref 37–153)
BUN/Creatinine Ratio: 24 (calc) — ABNORMAL HIGH (ref 6–22)
BUN: 28 mg/dL — ABNORMAL HIGH (ref 7–25)
CO2: 25 mmol/L (ref 20–32)
Calcium: 10.4 mg/dL (ref 8.6–10.4)
Chloride: 105 mmol/L (ref 98–110)
Creat: 1.19 mg/dL — ABNORMAL HIGH (ref 0.50–1.03)
Globulin: 2.6 g/dL (calc) (ref 1.9–3.7)
Glucose, Bld: 52 mg/dL — ABNORMAL LOW (ref 65–99)
Potassium: 3.8 mmol/L (ref 3.5–5.3)
Sodium: 139 mmol/L (ref 135–146)
Total Bilirubin: 0.8 mg/dL (ref 0.2–1.2)
Total Protein: 7.1 g/dL (ref 6.1–8.1)
eGFR: 56 mL/min/{1.73_m2} — ABNORMAL LOW (ref >=60)

## 2021-01-31 LAB — HEMOGLOBIN A1C
Hgb A1c MFr Bld: 7.9 % of total Hgb — ABNORMAL HIGH (ref <5.7)
Mean Plasma Glucose: 180 mg/dL
eAG (mmol/L): 10 mmol/L

## 2021-01-31 LAB — TSH: TSH: 1.83 mIU/L

## 2021-02-02 ENCOUNTER — Telehealth: Payer: Self-pay | Admitting: *Deleted

## 2021-02-02 NOTE — Telephone Encounter (Signed)
Received call from patient.   Reports that blood sugars continue to run high. Her readings are as follows: 8-Aug 9:45 AM 256   10:45 AM 262   11:30 AM 300   3:55 PM HIGH   10:50 PM 313   Lantus 25U  9-Aug 8:03 AM 285   8:38 AM 273   8:40 AM 275   9:49 AM 369   11:44 AM 366   3:24 PM 395   8:15 PM 391   Lantus 25U  10-Aug 7:50 AM 240   8:39 AM 283   10:48 AM 360   11:02 AM 360   Lantus 25U   3:22 PM 395  11-Aug 6:48 AM 271   10:10 AM 302   Lantus 25U   12:44 PM 366   Patient states that she is not taking Humulin R at this time.   Please advise.

## 2021-02-03 MED ORDER — INSULIN GLARGINE 100 UNIT/ML ~~LOC~~ SOLN: 25.0000 [IU] | Freq: Two times a day (BID) | SUBCUTANEOUS | 11 refills | Status: DC

## 2021-02-03 NOTE — Telephone Encounter (Signed)
Call placed to patient and patient made aware.   Medication list updated.  

## 2021-02-05 ENCOUNTER — Other Ambulatory Visit: Payer: Self-pay | Admitting: Endocrinology

## 2021-02-06 ENCOUNTER — Telehealth: Payer: Self-pay | Admitting: *Deleted

## 2021-02-06 ENCOUNTER — Telehealth: Payer: Self-pay | Admitting: Family Medicine

## 2021-02-06 MED ORDER — INSULIN GLARGINE 100 UNIT/ML ~~LOC~~ SOLN: 40.0000 [IU] | Freq: Two times a day (BID) | SUBCUTANEOUS | 11 refills | Status: DC

## 2021-02-06 NOTE — Telephone Encounter (Signed)
Call placed to patient.   Advised that referral has been placed to [facility], [address], [place], [place] [address] Phone: [phone].

## 2021-02-06 NOTE — Telephone Encounter (Signed)
Patient called to follow up on referral for sleep study; still waiting for call back with name of facility so appt can be scheduled. Please advise asap at [phone] .

## 2021-02-06 NOTE — Telephone Encounter (Signed)
Received call from patient.   Reports that [facility] is a 3rd party and cannot see in any workque on Epic.   Requested to have orders faxed to [phone].

## 2021-02-06 NOTE — Telephone Encounter (Signed)
Received call from patient.   Reports that blood sugar readings are as follows: 13-Aug 8:43 AM 261   1:04 PM 301   1:31 PM 301   4:15 PM 288   6:06 PM 242  14-Aug 12:27 PM 223   1:30 PM 254   2:50 PM 244  15-Aug 2:14 AM 242   9:05 AM 241   1:04 PM 301   3:21 PM 288   Patient states that she is taking Lantus 25U BID. Reports that she did take Lantus 25U today before lunch.   Also reports that she is still only taking Ozempic 0.25mg . Inquired if she should increase her dose.   Please advise.

## 2021-02-06 NOTE — Telephone Encounter (Signed)
Call placed to patient and patient made aware.  

## 2021-02-07 ENCOUNTER — Encounter: Payer: Self-pay | Admitting: Family Medicine

## 2021-02-07 DIAGNOSIS — F5081 Binge eating disorder: Secondary | ICD-10-CM | POA: Diagnosis not present

## 2021-02-07 DIAGNOSIS — F411 Generalized anxiety disorder: Secondary | ICD-10-CM | POA: Diagnosis not present

## 2021-02-07 DIAGNOSIS — F331 Major depressive disorder, recurrent, moderate: Secondary | ICD-10-CM | POA: Diagnosis not present

## 2021-02-08 ENCOUNTER — Other Ambulatory Visit: Payer: Self-pay | Admitting: *Deleted

## 2021-02-08 DIAGNOSIS — R5383 Other fatigue: Secondary | ICD-10-CM

## 2021-02-08 DIAGNOSIS — G4710 Hypersomnia, unspecified: Secondary | ICD-10-CM

## 2021-02-09 ENCOUNTER — Other Ambulatory Visit: Payer: Self-pay | Admitting: Family Medicine

## 2021-02-09 DIAGNOSIS — G4710 Hypersomnia, unspecified: Secondary | ICD-10-CM

## 2021-02-22 ENCOUNTER — Ambulatory Visit: Payer: Medicaid Other | Admitting: Endocrinology

## 2021-02-22 ENCOUNTER — Encounter: Payer: Self-pay | Admitting: Endocrinology

## 2021-02-22 ENCOUNTER — Other Ambulatory Visit: Admission: RE | Admit: 2021-02-22 | Payer: Medicaid Other | Source: Ambulatory Visit

## 2021-02-23 DIAGNOSIS — Z20822 Contact with and (suspected) exposure to covid-19: Secondary | ICD-10-CM | POA: Diagnosis not present

## 2021-02-27 ENCOUNTER — Other Ambulatory Visit: Payer: Self-pay | Admitting: Endocrinology

## 2021-02-28 ENCOUNTER — Encounter: Payer: Self-pay | Admitting: Family Medicine

## 2021-02-28 ENCOUNTER — Ambulatory Visit: Payer: Medicaid Other | Attending: Neurology

## 2021-02-28 ENCOUNTER — Other Ambulatory Visit: Admission: RE | Admit: 2021-02-28 | Payer: Medicaid Other | Source: Ambulatory Visit

## 2021-02-28 DIAGNOSIS — R5383 Other fatigue: Secondary | ICD-10-CM | POA: Insufficient documentation

## 2021-02-28 DIAGNOSIS — G4733 Obstructive sleep apnea (adult) (pediatric): Secondary | ICD-10-CM | POA: Diagnosis not present

## 2021-02-28 DIAGNOSIS — G4710 Hypersomnia, unspecified: Secondary | ICD-10-CM | POA: Diagnosis not present

## 2021-03-01 ENCOUNTER — Other Ambulatory Visit: Payer: Self-pay

## 2021-03-02 ENCOUNTER — Other Ambulatory Visit: Payer: Self-pay | Admitting: Endocrinology

## 2021-03-03 MED ORDER — METOCLOPRAMIDE HCL 10 MG PO TABS: 10.0000 mg | ORAL_TABLET | Freq: Four times a day (QID) | ORAL | 3 refills | Status: DC

## 2021-03-06 ENCOUNTER — Other Ambulatory Visit (HOSPITAL_COMMUNITY): Payer: Self-pay

## 2021-03-06 ENCOUNTER — Telehealth: Payer: Self-pay | Admitting: Pharmacy Technician

## 2021-03-06 NOTE — Telephone Encounter (Signed)
Patient Advocate Encounter   Received notification from [employer] that prior authorization for [facility] is required.   PA submitted on 03/06/2021 Key [id number] Status is pending    [facility] will continue to follow:   Sherilyn Dacosta, CPhT Patient Advocate [facility] Phone: [phone] Fax:  [phone]

## 2021-03-07 ENCOUNTER — Telehealth: Payer: Self-pay | Admitting: *Deleted

## 2021-03-07 ENCOUNTER — Encounter: Payer: Self-pay | Admitting: Family Medicine

## 2021-03-07 ENCOUNTER — Other Ambulatory Visit (HOSPITAL_COMMUNITY): Payer: Self-pay

## 2021-03-07 NOTE — Telephone Encounter (Signed)
Received results of sleep study.   Impression: Severe OSA with AHI 40.3/ hour  Progress should be closely followed with machine downloads to make sure the pressure is adequate given the interrupted REM sleep in study. Further titration might be necessary if there are breakthrough events.   Recommendations: CPAP therapy with pressure setting of 9cm H2O with heated humidity and small nasal mask.   Order sent to [employer] for AutoPap 5-20 cm H2O with heated humidity and small nasal mask.

## 2021-03-07 NOTE — Telephone Encounter (Signed)
Results received and placed on desk for review.

## 2021-03-07 NOTE — Telephone Encounter (Signed)
Patient Advocate Encounter   Received notification from [employer] that prior authorization for [facility] is required.   PA submitted on 03/06/2021 Key [facility] Status is denied based on quantity limit.  Submitted a second PA request today after speaking with the ins. 03/07/21 KEY: [id number] Status is pending.

## 2021-03-08 ENCOUNTER — Telehealth: Payer: Self-pay | Admitting: Endocrinology

## 2021-03-08 NOTE — Telephone Encounter (Signed)
Patient dismissed from [facility] by Reather Littler, MD, effective 02/22/21. Dismissal Letter sent out by 1st class mail. KLM

## 2021-03-09 ENCOUNTER — Telehealth: Payer: Self-pay | Admitting: Pharmacy Technician

## 2021-03-09 ENCOUNTER — Other Ambulatory Visit (HOSPITAL_COMMUNITY): Payer: Self-pay

## 2021-03-16 ENCOUNTER — Encounter: Payer: Self-pay | Admitting: Family Medicine

## 2021-03-16 ENCOUNTER — Other Ambulatory Visit (HOSPITAL_COMMUNITY): Payer: Self-pay

## 2021-03-20 ENCOUNTER — Other Ambulatory Visit: Payer: Self-pay | Admitting: Endocrinology

## 2021-03-20 ENCOUNTER — Other Ambulatory Visit: Payer: Self-pay | Admitting: Family Medicine

## 2021-03-21 ENCOUNTER — Telehealth: Payer: Self-pay

## 2021-03-21 NOTE — Telephone Encounter (Signed)
Is patient on Humilin? Need clarification. Looks like the medication was discontinued by an LPN back on 3/56/86. Please advise

## 2021-03-23 NOTE — Telephone Encounter (Signed)
ERROR

## 2021-03-24 ENCOUNTER — Encounter: Payer: Self-pay | Admitting: Family Medicine

## 2021-03-24 DIAGNOSIS — G4733 Obstructive sleep apnea (adult) (pediatric): Secondary | ICD-10-CM

## 2021-03-28 ENCOUNTER — Other Ambulatory Visit: Payer: Self-pay | Admitting: Endocrinology

## 2021-03-28 ENCOUNTER — Other Ambulatory Visit: Payer: Self-pay | Admitting: Family Medicine

## 2021-03-30 ENCOUNTER — Encounter: Payer: Self-pay | Admitting: Family Medicine

## 2021-03-30 ENCOUNTER — Ambulatory Visit (INDEPENDENT_AMBULATORY_CARE_PROVIDER_SITE_OTHER): Payer: Medicaid Other | Admitting: Family Medicine

## 2021-03-30 ENCOUNTER — Other Ambulatory Visit: Payer: Self-pay

## 2021-03-30 DIAGNOSIS — K3184 Gastroparesis: Secondary | ICD-10-CM

## 2021-03-30 DIAGNOSIS — F419 Anxiety disorder, unspecified: Secondary | ICD-10-CM

## 2021-03-30 DIAGNOSIS — F321 Major depressive disorder, single episode, moderate: Secondary | ICD-10-CM

## 2021-03-30 NOTE — Progress Notes (Signed)
Subjective:    Patient ID: Kimberly [name], female    DOB: 1971/03/22, 50 y.o.   MRN: [id number]  Patient is here today requesting increase Xanax.  She is currently on Xanax 1 mg 3 times a day for uncontrolled anxiety.  She states that she hates her job.  Apparently it causes tremendous stress.  She sits in her car and cries every day before going into work.  She was hoping to increase the Xanax to 2 mg 3 times a day simply to help her make it through until something changes.  However she has not look for a new job and has no long-term solution and mine.  She is no longer seeing her psychiatrist and has stopped the Pristiq and the clonidine that they were prescribing for her.  She is still on bupropion.  She denies any suicidal ideation.  She does report uncontrolled nausea on Ozempic 0.5 mg subcu weekly.  The first 3 days after the Ozempic she feels extremely nauseated.  After that it improves and it becomes more tolerable. Past Medical History:  Diagnosis Date   Abnormal vaginal Pap smear    tx with cryotherapy   Anxiety    Depression    Diabetes mellitus without complication (HCC)    History of kidney stones    passed stone - no surgery   Hypertension    Migraine    otc med prn   Obesity    OSA (obstructive sleep apnea)    severe- RDI 43   Pseudotumor cerebri    SVD (spontaneous vaginal delivery)    x 3   Tension headache    Past Surgical History:  Procedure Laterality Date   ABDOMINAL HYSTERECTOMY     ANTERIOR CERVICAL DECOMP/DISCECTOMY FUSION N/A 01/03/2016   Procedure: ANTERIOR CERVICAL DECOMPRESSION FUSION CERVICAL FIVE-SIX,CERVICAL SIX-SEVEN.;  Surgeon: Lisbeth Renshaw, MD;  Location: [facility];  Service: Neurosurgery;  Laterality: N/A;  right side approach   CRYOTHERAPY     LAPAROSCOPIC ASSISTED VAGINAL HYSTERECTOMY Bilateral 04/21/2014   Procedure: LAPAROSCOPIC ASSISTED VAGINAL HYSTERECTOMY, BILATERAL SALPINGECTOMY;  Surgeon: Lavina Hamman, MD;  Location: [facility];   Service: Gynecology;  Laterality: Bilateral;   MANDIBLE FRACTURE SURGERY  2002   x 1   TUBAL LIGATION  11/2003   WISDOM TOOTH EXTRACTION     Current Outpatient Medications on File Prior to Visit  Medication Sig Dispense Refill   acetaZOLAMIDE (DIAMOX) 250 MG tablet TAKE 1 TABLET BY MOUTH TWICE A DAY 180 tablet 1   ALPRAZolam (XANAX) 1 MG tablet TAKE 1 TABLET BY MOUTH THREE TIMES A DAY AS NEEDED 90 tablet 3   atenolol (TENORMIN) 100 MG tablet Take 1 tablet (100 mg total) by mouth daily. 90 tablet 3   buPROPion (WELLBUTRIN XL) 300 MG 24 hr tablet TAKE 1 TABLET BY MOUTH EVERY DAY (Patient taking differently: Take 300 mg by mouth daily.) 90 tablet 2   cetirizine (ZYRTEC) 10 MG tablet Take 1 tablet (10 mg total) by mouth daily. 30 tablet 11   fluticasone (FLONASE) 50 MCG/ACT nasal spray SPRAY 2 SPRAYS INTO EACH NOSTRIL EVERY DAY 48 mL 2   insulin glargine (LANTUS) 100 UNIT/ML injection Inject 0.4 mLs (40 Units total) into the skin 2 (two) times daily. 10 mL 11   losartan (COZAAR) 50 MG tablet TAKE 1 TABLET BY MOUTH EVERY DAY 90 tablet 1   OZEMPIC, 0.25 OR 0.5 MG/DOSE, 2 MG/1.5ML SOPN INJECT 0.5 MG INTO THE SKIN ONCE A WEEK. 1.5 mL 2  ACCU-CHEK GUIDE test strip USE AS INSTRUCTED TO TEST BLOOD SUGAR 5 TIMES DAILY 100 strip 12   acetaminophen (TYLENOL) 325 MG tablet Take 650 mg by mouth every 6 (six) hours as needed for mild pain or headache.     Blood Glucose Monitoring Suppl (ACCU-CHEK GUIDE ME) w/Device KIT 1 each by Does not apply route daily. 1 kit 0   Continuous Blood Gluc Receiver (FREESTYLE LIBRE 2 READER) DEVI Use as instructed to check blood sugar 4 times daily 1 each 0   desvenlafaxine (PRISTIQ) 100 MG 24 hr tablet Take 100 mg by mouth every morning. (Patient not taking: Reported on 03/30/2021)     Lancets Misc. (ACCU-CHEK FASTCLIX LANCET) KIT 1 each by Does not apply route daily. Use to check blood sugar 5 times daily. 1 kit 12   metoCLOPramide (REGLAN) 10 MG tablet Take 1 tablet (10 mg  total) by mouth 4 (four) times daily. (Patient not taking: Reported on 03/30/2021) 30 tablet 3   rosuvastatin (CRESTOR) 10 MG tablet TAKE 1 TABLET BY MOUTH EVERY DAY 30 tablet 0   sodium chloride (OCEAN) 0.65 % SOLN nasal spray Place 1 spray into both nostrils as needed for congestion. 88 mL 0   No current facility-administered medications on file prior to visit.   No Known Allergies Social History   Socioeconomic History   Marital status: Divorced    Spouse name: Not on file   Number of children: Not on file   Years of education: Not on file   Highest education level: Not on file  Occupational History   Not on file  Tobacco Use   Smoking status: Never   Smokeless tobacco: Never  Substance and Sexual Activity   Alcohol use: Yes    Alcohol/week: 1.0 standard drink    Types: 1 Glasses of wine per week    Comment: one glass of wine per week   Drug use: No   Sexual activity: Yes    Birth control/protection: Surgical  Other Topics Concern   Not on file  Social History Narrative   Not on file   Social Determinants of Health   Financial Resource Strain: Not on file  Food Insecurity: Not on file  Transportation Needs: Not on file  Physical Activity: Not on file  Stress: Not on file  Social Connections: Not on file  Intimate Partner Violence: Not on file     Review of Systems  All other systems reviewed and are negative.     Objective:   Physical Exam Vitals reviewed.  Constitutional:      General: She is not in acute distress.    Appearance: She is well-developed. She is not diaphoretic.  Eyes:     Conjunctiva/sclera: Conjunctivae normal.  Cardiovascular:     Rate and Rhythm: Normal rate and regular rhythm.     Heart sounds: Normal heart sounds.  Pulmonary:     Effort: Pulmonary effort is normal. No respiratory distress.     Breath sounds: Normal breath sounds. No wheezing or rales.  Abdominal:     General: Bowel sounds are normal. There is no distension.      Palpations: Abdomen is soft.     Tenderness: There is no abdominal tenderness. There is no guarding or rebound.  Musculoskeletal:     Cervical back: Neck supple.  Lymphadenopathy:     Cervical: No cervical adenopathy.          Assessment & Plan:  Morbid obesity (HCC)  Gastroparesis  Current moderate  episode of major depressive disorder, unspecified whether recurrent (HCC)  Anxiety I explained to the patient that I would not increase Xanax.  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  Simply to increase the dose would only exacerbate the problem in the long run.  I have recommended that she look for a different job if the stress of her current position is unbearable.  She can continue the Xanax 1 mg 3 times a day or we could switch to a different benzodiazepine such as Klonopin 1 mg 3 times a day that her body is nave to which may work better at least in the short run.  The patient prefers not to make any changes at the present time but will consider it.  I recommended that she decrease the Ozempic back to 0.25 mg subcu weekly.  We also discussed eating smaller meals and portion sizes to prevent nausea after the Ozempic.  She does have a history of gastroparesis so Ozempic may not be a good choice for her in the long run

## 2021-04-04 DIAGNOSIS — N951 Menopausal and female climacteric states: Secondary | ICD-10-CM | POA: Diagnosis not present

## 2021-04-04 DIAGNOSIS — N3946 Mixed incontinence: Secondary | ICD-10-CM | POA: Diagnosis not present

## 2021-04-04 DIAGNOSIS — Z1231 Encounter for screening mammogram for malignant neoplasm of breast: Secondary | ICD-10-CM | POA: Diagnosis not present

## 2021-04-04 DIAGNOSIS — Z01419 Encounter for gynecological examination (general) (routine) without abnormal findings: Secondary | ICD-10-CM | POA: Diagnosis not present

## 2021-04-04 DIAGNOSIS — Z6841 Body Mass Index (BMI) 40.0 and over, adult: Secondary | ICD-10-CM | POA: Diagnosis not present

## 2021-04-04 DIAGNOSIS — Z130 Encounter for screening for diseases of the blood and blood-forming organs and certain disorders involving the immune mechanism: Secondary | ICD-10-CM | POA: Diagnosis not present

## 2021-04-10 ENCOUNTER — Other Ambulatory Visit: Payer: Self-pay | Admitting: Endocrinology

## 2021-04-19 ENCOUNTER — Other Ambulatory Visit: Payer: Self-pay | Admitting: Endocrinology

## 2021-04-19 MED ORDER — ACCU-CHEK GUIDE VI STRP: ORAL_STRIP | 12 refills | Status: DC

## 2021-04-19 NOTE — Progress Notes (Deleted)
Urogynecology New Patient Evaluation and Consultation  Referring Provider: Lavina Hamman, MD PCP: Donita Brooks, MD Date of Service: 04/20/2021  SUBJECTIVE Chief Complaint: No chief complaint on file.  History of Present Illness: Kimberly [name] is a 50 y.o. {ED SANE [phone] female seen in consultation at the request of Dr. Jackelyn Knife for evaluation of incontinence.    Review of records from Dr Jackelyn Knife significant for: Had used vesicare for leakage in the past but had constipation. Now leaks with standing and walking.   Urinary Symptoms: {urine leakage?:24754} Leaks *** time(s) per {days/wks/mos/yrs:310907}.  Pad use: {NUMBERS 1-10:18281} {pad option:24752} per day.   She {ACTION; IS/IS [employer]:41324401} bothered by her UI symptoms.  Day time voids ***.  Nocturia: *** times per night to void. Voiding dysfunction: she {empties:24755} her bladder well.  {DOES NOT does:27190::"does not"} use a catheter to empty bladder.  When urinating, she feels {urine symptoms:24756} Drinks: *** per day  UTIs: {NUMBERS 1-10:18281} UTI's in the last year.   {ACTIONS;DENIES/REPORTS:21021675::"Denies"} history of {urologic concerns:24757}  Pelvic Organ Prolapse Symptoms:                  She {denies/ admits to:24761} a feeling of a bulge the vaginal area. It has been present for {NUMBER 1-10:22536} {days/wks/mos/yrs:310907}.  She {denies/ admits to:24761} seeing a bulge.  This bulge {ACTION; IS/IS UUV:25366440} bothersome.  Bowel Symptom: Bowel movements: *** time(s) per {Time; day/week/month:13537} Stool consistency: {stool consistency:24758} Straining: {yes/no:19897}.  Splinting: {yes/no:19897}.  Incomplete evacuation: {yes/no:19897}.  She {denies/ admits to:24761} accidental bowel leakage / fecal incontinence  Occurs: *** time(s) per {Time; day/week/month:13537}  Consistency with leakage: {stool consistency:24758} Bowel regimen: {bowel regimen:24759} Last colonoscopy:  Date ***, Results ***  Sexual Function Sexually active: {yes/no:19897}.  Sexual orientation: {Sexual Orientation:[phone]} Pain with sex: {pain with sex:24762}  Pelvic Pain {denies/ admits to:24761} pelvic pain Location: *** Pain occurs: *** Prior pain treatment: *** Improved by: *** Worsened by: ***   Past Medical History:  Past Medical History:  Diagnosis Date   Abnormal vaginal Pap smear    tx with cryotherapy   Anxiety    Depression    Diabetes mellitus without complication (HCC)    History of kidney stones    passed stone - no surgery   Hypertension    Migraine    otc med prn   Obesity    OSA (obstructive sleep apnea)    severe- RDI 43   Pseudotumor cerebri    SVD (spontaneous vaginal delivery)    x 3   Tension headache      Past Surgical History:   Past Surgical History:  Procedure Laterality Date   ABDOMINAL HYSTERECTOMY     ANTERIOR CERVICAL DECOMP/DISCECTOMY FUSION N/A 01/03/2016   Procedure: ANTERIOR CERVICAL DECOMPRESSION FUSION CERVICAL FIVE-SIX,CERVICAL SIX-SEVEN.;  Surgeon: Lisbeth Renshaw, MD;  Location: [facility];  Service: Neurosurgery;  Laterality: N/A;  right side approach   CRYOTHERAPY     LAPAROSCOPIC ASSISTED VAGINAL HYSTERECTOMY Bilateral 04/21/2014   Procedure: LAPAROSCOPIC ASSISTED VAGINAL HYSTERECTOMY, BILATERAL SALPINGECTOMY;  Surgeon: Lavina Hamman, MD;  Location: [facility];  Service: Gynecology;  Laterality: Bilateral;   MANDIBLE FRACTURE SURGERY  2002   x 1   TUBAL LIGATION  11/2003   WISDOM TOOTH EXTRACTION       Past OB/GYN History: G{NUMBERS 1-10:18281} P{NUMBERS 1-10:18281} Vaginal deliveries: ***,  Forceps/ Vacuum deliveries: ***, Cesarean section: *** Menopausal: {menopausal:24763} Contraception: ***. Last pap smear was ***.  Any history of abnormal pap smears: {yes/no:19897}.   Medications:  She has a current medication list which includes the following prescription(s): acetaminophen, acetazolamide, alprazolam,  atenolol, accu-chek guide me, bupropion, cetirizine, freestyle libre 2 reader, desvenlafaxine, fluticasone, accu-chek guide, insulin glargine, accu-chek fastclix lancet, losartan, metoclopramide, ozempic (0.25 or 0.5 mg/dose), rosuvastatin, and sodium chloride.   Allergies: Patient has No Known Allergies.   Social History:  Social History   Tobacco Use   Smoking status: Never   Smokeless tobacco: Never  Substance Use Topics   Alcohol use: Yes    Alcohol/week: 1.0 standard drink    Types: 1 Glasses of wine per week    Comment: one glass of wine per week   Drug use: No    Relationship status: {relationship status:24764} She lives with ***.   She {ACTION; IS/IS XQJ:19417408} employed ***. Regular exercise: {Yes/No:304960894} History of abuse: {Yes/No:304960894}  Family History:   Family History  Problem Relation Age of Onset   Hypertension Mother    Diabetes Mother    Breast cancer Mother    Diabetes Sister    Hypertension Sister      Review of Systems: ROS   OBJECTIVE Physical Exam: There were no vitals filed for this visit.  Physical Exam   GU / Detailed Urogynecologic Evaluation:  Pelvic Exam: Normal external female genitalia; Bartholin's and Skene's glands normal in appearance; urethral meatus normal in appearance, no urethral masses or discharge.   CST: {gen negative/positive:315881}  Reflexes: bulbocavernosis {DESC; PRESENT/NOT PRESENT:21021351}, anocutaneous {DESC; PRESENT/NOT PRESENT:21021351} ***bilaterally.  Speculum exam reveals normal vaginal mucosa {With/Without:20273} atrophy. Cervix {exam; gyn cervix:30847}. Uterus {exam; pelvic uterus:30849}. Adnexa {exam; adnexa:12223}.    s/p hysterectomy: Speculum exam reveals normal vaginal mucosa {With/Without:20273}  atrophy and normal vaginal cuff.  Adnexa {exam; adnexa:12223}.    With apex supported, anterior compartment defect was {reduced:24765}  Pelvic floor strength {Roman # I-V:19040}/V, puborectalis  {Roman # I-V:19040}/V external anal sphincter {Roman # I-V:19040}/V  Pelvic floor musculature: Right levator {Tender/Non-tender:20250}, Right obturator {Tender/Non-tender:20250}, Left levator {Tender/Non-tender:20250}, Left obturator {Tender/Non-tender:20250}  POP-Q:   POP-Q                                               Aa                                               Ba                                                 C                                                Gh                                               Pb  tvl                                                Ap                                               Bp                                                 D     Rectal Exam:  Normal sphincter tone, {rectocele:24766} distal rectocele, enterocoele {DESC; PRESENT/NOT PRESENT:21021351}, no rectal masses, {sign of:24767} dyssynergia when asking the patient to bear down.  Post-Void Residual (PVR) by Bladder Scan: In order to evaluate bladder emptying, we discussed obtaining a postvoid residual and she agreed to this procedure.  Procedure: The ultrasound unit was placed on the patient's abdomen in the suprapubic region after the patient had voided. A PVR of *** ml was obtained by bladder scan.  Laboratory Results: @ENCLABS @   ***I visualized the urine specimen, noting the specimen to be {urine color:24768}  ASSESSMENT AND PLAN Ms. Daniely is a 50 y.o. with: No diagnosis found.    44, MD   Medical Decision Making:  - Reviewed/ ordered a clinical laboratory test - Reviewed/ ordered a radiologic study - Reviewed/ ordered medicine test - Decision to obtain old records - Discussion of management of or test interpretation with an external physician / other healthcare professional  - Assessment requiring independent historian - Review and summation of prior records - Independent review of image,  tracing or specimen

## 2021-04-19 NOTE — Telephone Encounter (Signed)
CPAP on national back order.  DM supplies ordered Ok to increase Ozempic to 1mg  SQ QW [employer] form to be completed once received

## 2021-04-20 ENCOUNTER — Ambulatory Visit: Payer: Medicaid Other | Admitting: Obstetrics and Gynecology

## 2021-04-20 DIAGNOSIS — G4733 Obstructive sleep apnea (adult) (pediatric): Secondary | ICD-10-CM | POA: Insufficient documentation

## 2021-04-20 MED ORDER — SEMAGLUTIDE (1 MG/DOSE) 4 MG/3ML ~~LOC~~ SOPN: 1.0000 mg | PEN_INJECTOR | SUBCUTANEOUS | 3 refills | Status: DC

## 2021-04-25 ENCOUNTER — Other Ambulatory Visit: Payer: Self-pay | Admitting: Family Medicine

## 2021-04-25 NOTE — Telephone Encounter (Signed)
Ok to refill??  Last office visit 03/30/2021.  Last refill 12/29/2020, #3 refills.

## 2021-04-26 ENCOUNTER — Telehealth: Payer: Self-pay | Admitting: *Deleted

## 2021-04-26 NOTE — Telephone Encounter (Signed)
Received form from patient for [facility] [employer] emergency services for CPAP usage.   This will allow patient to have services restored first in the event of inclement weather.   Form completed and placed on provider desk for signature.

## 2021-04-28 MED ORDER — SEMAGLUTIDE (2 MG/DOSE) 8 MG/3ML ~~LOC~~ SOPN: 2.0000 mg | PEN_INJECTOR | SUBCUTANEOUS | 3 refills | Status: DC

## 2021-05-05 DIAGNOSIS — G4733 Obstructive sleep apnea (adult) (pediatric): Secondary | ICD-10-CM | POA: Diagnosis not present

## 2021-05-07 ENCOUNTER — Other Ambulatory Visit: Payer: Self-pay | Admitting: Endocrinology

## 2021-05-22 ENCOUNTER — Other Ambulatory Visit: Payer: Self-pay | Admitting: Endocrinology

## 2021-05-25 ENCOUNTER — Other Ambulatory Visit: Payer: Self-pay | Admitting: Endocrinology

## 2021-05-29 DIAGNOSIS — G4710 Hypersomnia, unspecified: Secondary | ICD-10-CM | POA: Diagnosis not present

## 2021-05-29 DIAGNOSIS — G4733 Obstructive sleep apnea (adult) (pediatric): Secondary | ICD-10-CM | POA: Diagnosis not present

## 2021-05-29 DIAGNOSIS — R0683 Snoring: Secondary | ICD-10-CM | POA: Diagnosis not present

## 2021-06-01 ENCOUNTER — Other Ambulatory Visit: Payer: Self-pay | Admitting: Family Medicine

## 2021-06-06 ENCOUNTER — Telehealth: Payer: Self-pay

## 2021-06-06 ENCOUNTER — Other Ambulatory Visit: Payer: Self-pay | Admitting: Family Medicine

## 2021-06-06 NOTE — Telephone Encounter (Signed)
Refill request from [facility] for furosemide 40mg , take daily as needed for fluid.

## 2021-06-07 NOTE — Telephone Encounter (Signed)
Mychart message sent to patient to clarify need for medication and schedule an appointment.

## 2021-06-21 ENCOUNTER — Other Ambulatory Visit: Payer: Self-pay | Admitting: Endocrinology

## 2021-06-28 ENCOUNTER — Other Ambulatory Visit: Payer: Self-pay | Admitting: Endocrinology

## 2021-06-29 DIAGNOSIS — G4733 Obstructive sleep apnea (adult) (pediatric): Secondary | ICD-10-CM | POA: Diagnosis not present

## 2021-06-29 DIAGNOSIS — R0683 Snoring: Secondary | ICD-10-CM | POA: Diagnosis not present

## 2021-06-29 DIAGNOSIS — G4710 Hypersomnia, unspecified: Secondary | ICD-10-CM | POA: Diagnosis not present

## 2021-07-07 ENCOUNTER — Other Ambulatory Visit: Payer: Self-pay | Admitting: Endocrinology

## 2021-07-24 ENCOUNTER — Other Ambulatory Visit: Payer: Self-pay

## 2021-07-24 MED ORDER — ALPRAZOLAM 1 MG PO TABS
1.0000 mg | ORAL_TABLET | Freq: Three times a day (TID) | ORAL | 3 refills | Status: DC | PRN
Start: 2021-07-24 — End: 2021-12-28

## 2021-07-24 NOTE — Telephone Encounter (Signed)
Pt called to state she is heading out of town due to a family emergency and would like to refill her Xanax 3 days early if possible.  LOV 03/30/21 Last refill 04/25/21, #90, 3 refills  Please review, thanks!

## 2021-07-26 ENCOUNTER — Other Ambulatory Visit: Payer: Self-pay

## 2021-07-30 DIAGNOSIS — G4733 Obstructive sleep apnea (adult) (pediatric): Secondary | ICD-10-CM | POA: Diagnosis not present

## 2021-07-30 DIAGNOSIS — R0683 Snoring: Secondary | ICD-10-CM | POA: Diagnosis not present

## 2021-07-30 DIAGNOSIS — G4710 Hypersomnia, unspecified: Secondary | ICD-10-CM | POA: Diagnosis not present

## 2021-08-15 ENCOUNTER — Other Ambulatory Visit: Payer: Self-pay

## 2021-08-15 MED ORDER — FUROSEMIDE 40 MG PO TABS: 40.0000 mg | ORAL_TABLET | Freq: Every day | ORAL | 5 refills | Status: DC | PRN

## 2021-08-24 LAB — HM DIABETES EYE EXAM

## 2021-08-25 ENCOUNTER — Encounter: Payer: Self-pay | Admitting: Family Medicine

## 2021-09-03 ENCOUNTER — Other Ambulatory Visit: Payer: Self-pay | Admitting: Endocrinology

## 2021-09-18 DIAGNOSIS — F331 Major depressive disorder, recurrent, moderate: Secondary | ICD-10-CM | POA: Diagnosis not present

## 2021-09-18 DIAGNOSIS — F411 Generalized anxiety disorder: Secondary | ICD-10-CM | POA: Diagnosis not present

## 2021-09-18 DIAGNOSIS — F5081 Binge eating disorder: Secondary | ICD-10-CM | POA: Diagnosis not present

## 2021-10-05 ENCOUNTER — Ambulatory Visit (INDEPENDENT_AMBULATORY_CARE_PROVIDER_SITE_OTHER): Payer: Medicaid Other | Admitting: Family Medicine

## 2021-10-05 VITALS — BP 124/70 | HR 65 | Temp 97.3°F | Ht 61.0 in | Wt 226.6 lb

## 2021-10-05 DIAGNOSIS — E1169 Type 2 diabetes mellitus with other specified complication: Secondary | ICD-10-CM | POA: Diagnosis not present

## 2021-10-05 DIAGNOSIS — F419 Anxiety disorder, unspecified: Secondary | ICD-10-CM

## 2021-10-05 DIAGNOSIS — Z1211 Encounter for screening for malignant neoplasm of colon: Secondary | ICD-10-CM | POA: Diagnosis not present

## 2021-10-05 DIAGNOSIS — Z794 Long term (current) use of insulin: Secondary | ICD-10-CM

## 2021-10-05 MED ORDER — OZEMPIC (1 MG/DOSE) 4 MG/3ML ~~LOC~~ SOPN
2.0000 mg | PEN_INJECTOR | SUBCUTANEOUS | 11 refills | Status: DC
Start: 2021-10-05 — End: 2022-06-04

## 2021-10-05 MED ORDER — OMNIPOD DASH PODS (GEN 4) MISC: 1.0000 | 11 refills | Status: DC

## 2021-10-05 NOTE — Progress Notes (Signed)
? ?Subjective:  ? ? Patient ID: Kimberly [name], female    DOB: 02-28-1971, 51 y.o.   MRN: [id number] ? ?10/22- A/P: ?I explained to the patient that I would not increase Xanax.  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  Simply to increase the dose would only exacerbate the problem in the long run.  I have recommended that she look for a different job if the stress of her current position is unbearable.  She can continue the Xanax 1 mg 3 times a day or we could switch to a different benzodiazepine such as Klonopin 1 mg 3 times a day that her body is na?ve to which may work better at least in the short run.  The patient prefers not to make any changes at the present time but will consider it.  I recommended that she decrease the Ozempic back to 0.25 mg subcu weekly.  We also discussed eating smaller meals and portion sizes to prevent nausea after the Ozempic.  She does have a history of gastroparesis so Ozempic may not be a good choice for her in the long run ? ?10/05/21 ?Wt Readings from Last 3 Encounters:  ?10/05/21 226 lb 9.6 oz (102.8 kg)  ?03/30/21 256 lb (116.1 kg)  ?01/30/21 265 lb (120.2 kg)  ? ?Since I last saw the patient she has lost 30 pounds!  She has joined a gym and she is working extremely hard at this.  Overall she is lost almost 40 pounds.  I am extremely proud of her.  She states that she is tolerating the Ozempic.  She has not had the severe nausea that she had previously.  She has been up to 1 mg and is tolerating that dose without difficulty.  She is also still on 25 units of Lantus.  She reports blood sugars between 110 and 160 at random times throughout the day.  She denies any hypoglycemic episodes below 80 and she denies seeing any sugars above 200.  Her blood pressure today is well controlled.  She denies any chest pain or shortness of breath or dyspnea on exertion.  I performed her diabetic  foot exam today and she has normal sensation to 10 g monofilament with excellent pulses bilaterally.  She does report some occasional burning and stinging in her left foot however she has normal sensation to 10 g monofilament. ?Past Medical History:  ?Diagnosis Date  ? Abnormal vaginal Pap smear   ? tx with cryotherapy  ? Anxiety   ? Depression   ? Diabetes mellitus without complication (HCC)   ? History of kidney stones   ? passed stone - no surgery  ? Hypertension   ? Migraine   ? otc med prn  ? Obesity   ? OSA (obstructive sleep apnea)   ? severe- RDI 43  ? Pseudotumor cerebri   ? SVD (spontaneous vaginal delivery)   ? x 3  ? Tension headache   ? ?Past Surgical History:  ?Procedure Laterality Date  ? ABDOMINAL HYSTERECTOMY    ? ANTERIOR CERVICAL DECOMP/DISCECTOMY FUSION N/A 01/03/2016  ? Procedure: ANTERIOR CERVICAL DECOMPRESSION FUSION CERVICAL FIVE-SIX,CERVICAL SIX-SEVEN.;  Surgeon: Lisbeth Renshaw, MD;  Location: [facility];  Service: Neurosurgery;  Laterality: N/A;  right side approach  ? CRYOTHERAPY    ? LAPAROSCOPIC ASSISTED VAGINAL HYSTERECTOMY Bilateral 04/21/2014  ? Procedure: LAPAROSCOPIC ASSISTED VAGINAL HYSTERECTOMY, BILATERAL SALPINGECTOMY;  Surgeon:  Lavina Hamman, MD;  Location: [facility];  Service: Gynecology;  Laterality: Bilateral;  ? MANDIBLE FRACTURE SURGERY  2002  ? x 1  ? TUBAL LIGATION  11/2003  ? WISDOM TOOTH EXTRACTION    ? ?Current Outpatient Medications on File Prior to Visit  ?Medication Sig Dispense Refill  ? acetaminophen (TYLENOL) 325 MG tablet Take 650 mg by mouth every 6 (six) hours as needed for mild pain or headache.    ? acetaZOLAMIDE (DIAMOX) 250 MG tablet TAKE 1 TABLET BY MOUTH TWICE A DAY 180 tablet 1  ? ALPRAZolam (XANAX) 1 MG tablet Take 1 tablet (1 mg total) by mouth 3 (three) times daily as needed. 90 tablet 3  ? ARIPiprazole (ABILIFY) 2 MG tablet Take 2 mg by mouth every morning.    ? atenolol (TENORMIN) 100 MG tablet Take 1 tablet (100 mg total) by mouth daily. 90  tablet 3  ? Blood Glucose Monitoring Suppl (ACCU-CHEK GUIDE ME) w/Device KIT 1 each by Does not apply route daily. 1 kit 0  ? buPROPion (WELLBUTRIN XL) 300 MG 24 hr tablet TAKE 1 TABLET BY MOUTH EVERY DAY (Patient taking differently: Take 300 mg by mouth daily.) 90 tablet 2  ? busPIRone (BUSPAR) 7.5 MG tablet Take 7.5 mg by mouth 2 (two) times daily.    ? cetirizine (ZYRTEC) 10 MG tablet Take 1 tablet (10 mg total) by mouth daily. 30 tablet 11  ? cloNIDine (CATAPRES) 0.1 MG tablet Take 0.1 mg by mouth 2 (two) times daily as needed.    ? Continuous Blood Gluc Receiver (FREESTYLE LIBRE 2 READER) DEVI Use as instructed to check blood sugar 4 times daily 1 each 0  ? desvenlafaxine (PRISTIQ) 100 MG 24 hr tablet Take 100 mg by mouth every morning. (Patient not taking: Reported on 03/30/2021)    ? estradiol (VIVELLE-DOT) 0.075 MG/24HR Place onto the skin.    ? fluticasone (FLONASE) 50 MCG/ACT nasal spray SPRAY 2 SPRAYS INTO EACH NOSTRIL EVERY DAY 48 mL 2  ? furosemide (LASIX) 40 MG tablet Take 1 tablet (40 mg total) by mouth daily as needed. 30 tablet 5  ? glucose blood (ACCU-CHEK GUIDE) test strip USE AS INSTRUCTED TO TEST BLOOD SUGAR 5 TIMES DAILY. Dx: E11.65 100 strip 12  ? Insulin Disposable Pump (OMNIPOD DASH PODS, GEN 4,) MISC Inject into the skin.    ? Lancets Misc. (ACCU-CHEK FASTCLIX LANCET) KIT 1 each by Does not apply route daily. Use to check blood sugar 5 times daily. 1 kit 12  ? LANTUS 100 UNIT/ML injection INJECT 0.25 MLS (25 UNITS TOTAL) INTO THE SKIN DAILY. 30 mL 3  ? losartan (COZAAR) 50 MG tablet TAKE 1 TABLET BY MOUTH EVERY DAY 90 tablet 1  ? metoCLOPramide (REGLAN) 10 MG tablet Take 1 tablet (10 mg total) by mouth 4 (four) times daily. (Patient not taking: Reported on 03/30/2021) 30 tablet 3  ? OZEMPIC, 1 MG/DOSE, 4 MG/3ML SOPN Inject into the skin.    ? rosuvastatin (CRESTOR) 10 MG tablet TAKE 1 TABLET BY MOUTH EVERY DAY 30 tablet 0  ? sodium chloride (OCEAN) 0.65 % SOLN nasal spray Place 1 spray into  both nostrils as needed for congestion. 88 mL 0  ? solifenacin (VESICARE) 10 MG tablet Take 10 mg by mouth daily.    ? traZODone (DESYREL) 100 MG tablet Take 100-200 mg by mouth at bedtime.    ? ?No current facility-administered medications on file prior to visit.  ? ?No Known Allergies ?Social History  ? ?  Socioeconomic History  ? Marital status: Divorced  ?  Spouse name: Not on file  ? Number of children: Not on file  ? Years of education: Not on file  ? Highest education level: Not on file  ?Occupational History  ? Not on file  ?Tobacco Use  ? Smoking status: Never  ? Smokeless tobacco: Never  ?Substance and Sexual Activity  ? Alcohol use: Yes  ?  Alcohol/week: 1.0 standard drink  ?  Types: 1 Glasses of wine per week  ?  Comment: one glass of wine per week  ? Drug use: No  ? Sexual activity: Yes  ?  Birth control/protection: Surgical  ?Other Topics Concern  ? Not on file  ?Social History Narrative  ? Not on file  ? ?Social Determinants of Health  ? ?Financial Resource Strain: Not on file  ?Food Insecurity: Not on file  ?Transportation Needs: Not on file  ?Physical Activity: Not on file  ?Stress: Not on file  ?Social Connections: Not on file  ?Intimate Partner Violence: Not on file  ? ? ? ?Review of Systems  ?All other systems reviewed and are negative. ? ?   ?Objective:  ? Physical Exam ?Vitals reviewed.  ?Constitutional:   ?   General: She is not in acute distress. ?   Appearance: She is well-developed. She is not diaphoretic.  ?Eyes:  ?   Conjunctiva/sclera: Conjunctivae normal.  ?Cardiovascular:  ?   Rate and Rhythm: Normal rate and regular rhythm.  ?   Heart sounds: Normal heart sounds.  ?Pulmonary:  ?   Effort: Pulmonary effort is normal. No respiratory distress.  ?   Breath sounds: Normal breath sounds. No wheezing or rales.  ?Abdominal:  ?   General: Bowel sounds are normal. There is no distension.  ?   Palpations: Abdomen is soft.  ?   Tenderness: There is no abdominal tenderness. There is no guarding or  rebound.  ?Musculoskeletal:  ?   Cervical back: Neck supple.  ?Lymphadenopathy:  ?   Cervical: No cervical adenopathy.  ? ? ? ? ? ?   ?Assessment & Plan:  ?Type 2 diabetes mellitus with other specified complic

## 2021-10-07 LAB — CBC WITH DIFFERENTIAL/PLATELET
Absolute Monocytes: 626 cells/uL (ref 200–950)
Basophils Absolute: 18 cells/uL (ref 0–200)
Basophils Relative: 0.2 %
Eosinophils Absolute: 0 cells/uL — ABNORMAL LOW (ref 15–500)
Eosinophils Relative: 0 %
HCT: 45.1 % — ABNORMAL HIGH (ref 35.0–45.0)
Hemoglobin: 14.9 g/dL (ref 11.7–15.5)
Lymphs Abs: 3027 cells/uL (ref 850–3900)
MCH: 29.3 pg (ref 27.0–33.0)
MCHC: 33 g/dL (ref 32.0–36.0)
MCV: 88.8 fL (ref 80.0–100.0)
MPV: 11.6 fL (ref 7.5–12.5)
Monocytes Relative: 6.8 %
Neutro Abs: 5529 cells/uL (ref 1500–7800)
Neutrophils Relative %: 60.1 %
Platelets: 215 10*3/uL (ref 140–400)
RBC: 5.08 10*6/uL (ref 3.80–5.10)
RDW: 12.7 % (ref 11.0–15.0)
Total Lymphocyte: 32.9 %
WBC: 9.2 10*3/uL (ref 3.8–10.8)

## 2021-10-07 LAB — COMPLETE METABOLIC PANEL WITH GFR
AG Ratio: 1.8 (calc) (ref 1.0–2.5)
ALT: 25 U/L (ref 6–29)
AST: 17 U/L (ref 10–35)
Albumin: 4.3 g/dL (ref 3.6–5.1)
Alkaline phosphatase (APISO): 75 U/L (ref 37–153)
BUN/Creatinine Ratio: 14 (calc) (ref 6–22)
BUN: 17 mg/dL (ref 7–25)
CO2: 26 mmol/L (ref 20–32)
Calcium: 9.6 mg/dL (ref 8.6–10.4)
Chloride: 109 mmol/L (ref 98–110)
Creat: 1.21 mg/dL — ABNORMAL HIGH (ref 0.50–1.03)
Globulin: 2.4 g/dL (calc) (ref 1.9–3.7)
Glucose, Bld: 119 mg/dL — ABNORMAL HIGH (ref 65–99)
Potassium: 4.1 mmol/L (ref 3.5–5.3)
Sodium: 143 mmol/L (ref 135–146)
Total Bilirubin: 0.7 mg/dL (ref 0.2–1.2)
Total Protein: 6.7 g/dL (ref 6.1–8.1)
eGFR: 55 mL/min/{1.73_m2} — ABNORMAL LOW (ref >=60)

## 2021-10-07 LAB — LIPID PANEL
Cholesterol: 130 mg/dL (ref <200)
HDL: 35 mg/dL — ABNORMAL LOW (ref >=50)
LDL Cholesterol (Calc): 69 mg/dL (calc)
Non-HDL Cholesterol (Calc): 95 mg/dL (calc) (ref <130)
Total CHOL/HDL Ratio: 3.7 (calc) (ref <5.0)
Triglycerides: 180 mg/dL — ABNORMAL HIGH (ref <150)

## 2021-10-07 LAB — HEMOGLOBIN A1C
Hgb A1c MFr Bld: 5.8 % of total Hgb — ABNORMAL HIGH (ref <5.7)
Mean Plasma Glucose: 120 mg/dL
eAG (mmol/L): 6.6 mmol/L

## 2021-10-07 LAB — MICROALBUMIN, URINE: Microalb, Ur: 1.8 mg/dL

## 2021-10-09 ENCOUNTER — Encounter: Payer: Self-pay | Admitting: Family Medicine

## 2021-10-20 ENCOUNTER — Other Ambulatory Visit: Payer: Self-pay | Admitting: Endocrinology

## 2021-10-20 DIAGNOSIS — E119 Type 2 diabetes mellitus without complications: Secondary | ICD-10-CM

## 2021-10-24 ENCOUNTER — Encounter: Payer: Self-pay | Admitting: Family Medicine

## 2021-10-24 DIAGNOSIS — E119 Type 2 diabetes mellitus without complications: Secondary | ICD-10-CM

## 2021-10-24 MED ORDER — FREESTYLE LIBRE 2 READER DEVI: 0 refills | Status: DC

## 2021-10-24 NOTE — Telephone Encounter (Signed)
Per pt Rx refill for [place] device ?

## 2021-10-27 ENCOUNTER — Other Ambulatory Visit: Payer: Self-pay

## 2021-10-27 DIAGNOSIS — E119 Type 2 diabetes mellitus without complications: Secondary | ICD-10-CM

## 2021-10-27 MED ORDER — CONTINUOUS BLOOD GLUC SENSOR MISC: 1.0000 | 5 refills | Status: DC

## 2021-10-27 NOTE — Progress Notes (Signed)
The reader was order per error, pt wanted the sensor. Sensor was sent to pharmacy today ?

## 2021-11-15 LAB — COLOGUARD

## 2021-11-23 ENCOUNTER — Other Ambulatory Visit: Payer: Self-pay | Admitting: Family Medicine

## 2021-11-24 NOTE — Telephone Encounter (Signed)
Requested Prescriptions  Pending Prescriptions Disp Refills  . acetaZOLAMIDE (DIAMOX) 250 MG tablet [Pharmacy Med Name: ACETAZOLAMIDE 250 MG TABLET] 180 tablet 1    Sig: TAKE 1 TABLET BY MOUTH TWICE A DAY     Ophthalmology: Glaucoma - acetazolamide Failed - 11/23/2021  7:30 PM      Failed - HCT in normal range and within 360 days    HCT  Date Value Ref Range Status  10/05/2021 45.1 (H) 35.0 - 45.0 % Final         Passed - HGB in normal range and within 360 days    Hemoglobin  Date Value Ref Range Status  10/05/2021 14.9 11.7 - 15.5 g/dL Final         Passed - K in normal range and within 360 days    Potassium  Date Value Ref Range Status  10/05/2021 4.1 3.5 - 5.3 mmol/L Final         Passed - Na in normal range and within 360 days    Sodium  Date Value Ref Range Status  10/05/2021 143 135 - 146 mmol/L Final         Passed - PLT in normal range and within 360 days    Platelets  Date Value Ref Range Status  10/05/2021 215 140 - 400 Thousand/uL Final         Passed - WBC in normal range and within 360 days    WBC  Date Value Ref Range Status  10/05/2021 9.2 3.8 - 10.8 Thousand/uL Final         Passed - Valid encounter within last 12 months    Recent Outpatient Visits          1 month ago Type 2 diabetes mellitus with other specified complication, with long-term current use of insulin ([place])   [facility] Pickard, Cammie Mcgee, MD   7 months ago Morbid obesity ([place])   [facility], Cammie Mcgee, MD   9 months ago Fatigue, unspecified type   [facility] Pick[facility], Cammie Mcgee, MD   1 year ago Acute non-recurrent maxillary sinusitis   [facility] Eulog[facility] Bear, NP   1 year ago Gastroparesis   [facility] Pickard, Cammie Mcgee, MD

## 2021-12-04 DIAGNOSIS — Z1211 Encounter for screening for malignant neoplasm of colon: Secondary | ICD-10-CM | POA: Diagnosis not present

## 2021-12-12 LAB — COLOGUARD: COLOGUARD: NEGATIVE

## 2021-12-27 ENCOUNTER — Other Ambulatory Visit: Payer: Self-pay | Admitting: Family Medicine

## 2021-12-28 ENCOUNTER — Encounter: Payer: Self-pay | Admitting: Family Medicine

## 2021-12-28 NOTE — Telephone Encounter (Signed)
LOV 10/05/21 Last refill 07/24/21, #90, 3 refills  Please review, thanks!

## 2022-01-10 DIAGNOSIS — F331 Major depressive disorder, recurrent, moderate: Secondary | ICD-10-CM | POA: Diagnosis not present

## 2022-01-10 DIAGNOSIS — F5081 Binge eating disorder: Secondary | ICD-10-CM | POA: Diagnosis not present

## 2022-01-10 DIAGNOSIS — F411 Generalized anxiety disorder: Secondary | ICD-10-CM | POA: Diagnosis not present

## 2022-01-22 ENCOUNTER — Other Ambulatory Visit: Payer: Self-pay | Admitting: Family Medicine

## 2022-01-23 NOTE — Telephone Encounter (Signed)
Requested medication (s) are due for refill today - expired Rx  Requested medication (s) are on the active medication list -yes  Future visit scheduled -no  Last refill: 12/02/20 #30 11Rf  Notes to clinic: expired Rx  Requested Prescriptions  Pending Prescriptions Disp Refills   cetirizine (ZYRTEC) 10 MG tablet [Pharmacy Med Name: CETIRIZINE HCL 10 MG TABLET] 30 tablet 11    Sig: TAKE 1 TABLET BY MOUTH EVERY DAY     Ear, Nose, and Throat:  Antihistamines 2 Failed - 01/22/2022  9:40 AM      Failed - Cr in normal range and within 360 days    Creat  Date Value Ref Range Status  10/05/2021 1.21 (H) 0.50 - 1.03 mg/dL Final   Creatinine,U  Date Value Ref Range Status  04/13/2020 144.3 mg/dL Final         Passed - Valid encounter within last 12 months    Recent Outpatient Visits           3 months ago Type 2 diabetes mellitus with other specified complication, with long-term current use of insulin (HCC)   [facility] Pickard, Priscille Heidelberg, MD   9 months ago Morbid obesity (HCC)   [facility] Pickard, Priscille Heidelberg, MD   11 months ago Fatigue, unspecified type   [facility] Pickard, Priscille Heidelberg, MD   1 year ago Acute non-recurrent maxillary sinusitis   [facility] Valentino Nose, NP   1 year ago Gastroparesis   [facility] Pickard, Priscille Heidelberg, MD                 Requested Prescriptions  Pending Prescriptions Disp Refills   cetirizine (ZYRTEC) 10 MG tablet [Pharmacy Med Name: CETIRIZINE HCL 10 MG TABLET] 30 tablet 11    Sig: TAKE 1 TABLET BY MOUTH EVERY DAY     Ear, Nose, and Throat:  Antihistamines 2 Failed - 01/22/2022  9:40 AM      Failed - Cr in normal range and within 360 days    Creat  Date Value Ref Range Status  10/05/2021 1.21 (H) 0.50 - 1.03 mg/dL Final   Creatinine,U  Date Value Ref Range Status  04/13/2020 144.3 mg/dL Final         Passed - Valid encounter within last 12  months    Recent Outpatient Visits           3 months ago Type 2 diabetes mellitus with other specified complication, with long-term current use of insulin (HCC)   [facility] Pickard, Priscille Heidelberg, MD   9 months ago Morbid obesity (HCC)   [facility] Pickard, Priscille Heidelberg, MD   11 months ago Fatigue, unspecified type   [facility] Pickard, Priscille Heidelberg, MD   1 year ago Acute non-recurrent maxillary sinusitis   [facility] Valentino Nose, NP   1 year ago Gastroparesis   [facility] Pickard, Priscille Heidelberg, MD

## 2022-01-29 ENCOUNTER — Other Ambulatory Visit: Payer: Self-pay | Admitting: Family Medicine

## 2022-01-29 ENCOUNTER — Encounter: Payer: Self-pay | Admitting: Family Medicine

## 2022-01-29 MED ORDER — METOCLOPRAMIDE HCL 10 MG PO TABS: 10.0000 mg | ORAL_TABLET | Freq: Four times a day (QID) | ORAL | 0 refills | Status: DC | PRN

## 2022-01-31 ENCOUNTER — Encounter (INDEPENDENT_AMBULATORY_CARE_PROVIDER_SITE_OTHER): Payer: Self-pay

## 2022-02-14 ENCOUNTER — Other Ambulatory Visit (HOSPITAL_COMMUNITY): Payer: Self-pay

## 2022-02-20 ENCOUNTER — Encounter: Payer: Self-pay | Admitting: Family Medicine

## 2022-02-22 ENCOUNTER — Other Ambulatory Visit: Payer: Self-pay

## 2022-02-22 ENCOUNTER — Encounter: Payer: Self-pay | Admitting: Family Medicine

## 2022-02-22 DIAGNOSIS — Z794 Long term (current) use of insulin: Secondary | ICD-10-CM

## 2022-02-22 MED ORDER — INSULIN GLARGINE 100 UNITS/ML SOLOSTAR PEN: 25.0000 [IU] | PEN_INJECTOR | SUBCUTANEOUS | Status: DC

## 2022-02-23 MED ORDER — LANTUS SOLOSTAR 100 UNIT/ML ~~LOC~~ SOPN: 25.0000 [IU] | PEN_INJECTOR | Freq: Every day | SUBCUTANEOUS | 3 refills | Status: DC

## 2022-02-23 NOTE — Progress Notes (Signed)
Error. Ment to select the pharmacy order and not the in-office administration order. Nurse, Joseph Art aware.

## 2022-02-25 ENCOUNTER — Other Ambulatory Visit: Payer: Self-pay | Admitting: Family Medicine

## 2022-02-26 ENCOUNTER — Other Ambulatory Visit: Payer: Self-pay | Admitting: Family Medicine

## 2022-02-28 ENCOUNTER — Other Ambulatory Visit: Payer: Self-pay

## 2022-02-28 MED ORDER — ACETAMINOPHEN 325 MG PO TABS: 650.0000 mg | ORAL_TABLET | Freq: Four times a day (QID) | ORAL | 0 refills | Status: DC | PRN

## 2022-02-28 NOTE — Telephone Encounter (Signed)
Pharmacy faxed a refill request for acetaminophen (TYLENOL) 325 MG tablet [[id number]]    Order Details Dose: 650 mg Route: Oral Frequency: Every 6 hours PRN for mild pain, headache  Dispense Quantity: -- Refills: --        Sig: Take 650 mg by mouth every 6 (six) hours as needed for mild pain or headache.       Start Date: -- End Date: --  Written Date: -- Expiration Date: --  Ordering Date: 04/21/20

## 2022-03-01 ENCOUNTER — Other Ambulatory Visit: Payer: Self-pay

## 2022-03-01 NOTE — Telephone Encounter (Signed)
Pharmacy faxed a refill request for atenolol (TENORMIN) 100 MG tablet [[id number]]    Order Details Dose: 100 mg Route: Oral Frequency: Daily  Dispense Quantity: 90 tablet Refills: 3        Sig: Take 1 tablet (100 mg total) by mouth daily.       Start Date: 12/02/20 End Date: --  Written Date: 12/02/20 Expiration Date: 12/02/21  Original Order:  atenolol (TENORMIN) 100 MG tablet [[id number]]  Providers

## 2022-03-05 MED ORDER — ATENOLOL 100 MG PO TABS: 100.0000 mg | ORAL_TABLET | Freq: Every day | ORAL | 0 refills | Status: DC

## 2022-03-05 NOTE — Telephone Encounter (Signed)
Requested Prescriptions  Pending Prescriptions Disp Refills  . atenolol (TENORMIN) 100 MG tablet 90 tablet 0    Sig: Take 1 tablet (100 mg total) by mouth daily.     Cardiovascular: Beta Blockers 2 Failed - 03/01/2022  4:49 PM      Failed - Cr in normal range and within 360 days    Creat  Date Value Ref Range Status  10/05/2021 1.21 (H) 0.50 - 1.03 mg/dL Final   Creatinine,U  Date Value Ref Range Status  04/13/2020 144.3 mg/dL Final         Passed - Last BP in normal range    BP Readings from Last 1 Encounters:  10/05/21 124/70         Passed - Last Heart Rate in normal range    Pulse Readings from Last 1 Encounters:  10/05/21 65         Passed - Valid encounter within last 6 months    Recent Outpatient Visits          5 months ago Type 2 diabetes mellitus with other specified complication, with long-term current use of insulin (HCC)   [facility] Pickard, Priscille Heidelberg, MD   11 months ago Morbid obesity (HCC)   [facility] Pickard, Priscille Heidelberg, MD   1 year ago Fatigue, unspecified type   [facility] Donita Brooks, MD   1 year ago Acute non-recurrent maxillary sinusitis   [facility] Valentino Nose, NP   1 year ago Gastroparesis   [facility] Pickard, Priscille Heidelberg, MD

## 2022-03-16 ENCOUNTER — Telehealth: Payer: Self-pay

## 2022-03-16 ENCOUNTER — Encounter: Payer: Self-pay | Admitting: Family Medicine

## 2022-03-16 NOTE — Telephone Encounter (Signed)
Pharmacy faxed a refill request for  Insulin Disposable Pump ([place], GEN 4,) MISC [[id number]]   Will need a prior authorization for this med  [facility]: [facility] [address]

## 2022-03-20 ENCOUNTER — Other Ambulatory Visit: Payer: Self-pay

## 2022-03-20 DIAGNOSIS — E1169 Type 2 diabetes mellitus with other specified complication: Secondary | ICD-10-CM

## 2022-03-20 MED ORDER — OMNIPOD DASH PODS (GEN 4) MISC: 1.0000 | 11 refills | Status: DC

## 2022-03-20 NOTE — Telephone Encounter (Signed)
Message sent to pt in My Chart:  When I tried to do your prior authorization on your [employer], the system is saying that your Member ID is not found. Can you verify your insurance and ID number for me? Thank you!  I also LM for pt to call office to discuss. Mjp,LPN

## 2022-03-29 ENCOUNTER — Encounter: Payer: Self-pay | Admitting: Family Medicine

## 2022-03-30 ENCOUNTER — Other Ambulatory Visit: Payer: Self-pay | Admitting: Family Medicine

## 2022-03-30 ENCOUNTER — Other Ambulatory Visit: Payer: Self-pay

## 2022-03-30 DIAGNOSIS — Z794 Long term (current) use of insulin: Secondary | ICD-10-CM

## 2022-03-30 MED ORDER — OMNIPOD DASH PODS (GEN 4) MISC: 1.0000 | 11 refills | Status: DC

## 2022-03-30 MED ORDER — DEXCOM G5 RECEIVER KIT DEVI: 3 refills | Status: DC

## 2022-03-30 MED ORDER — DEXCOM G5 MOB/G4 PLAT SENSOR MISC: 3 refills | Status: DC

## 2022-03-30 NOTE — Telephone Encounter (Signed)
Requested medications are due for refill today.  See pharmacy note  Requested medications are on the active medications list.  yes  Last refill. 03/30/2022 - not filled  Future visit scheduled.   no  Notes to clinic.  Pharmacy comment: Script Clarification:NOT COVERED BY INSURANCE.    Requested Prescriptions  Pending Prescriptions Disp Refills   Continuous Blood Gluc Receiver ([place]) [place] [Pharmacy Med Name: DEXCOM G5 RECEIVER KIT] 1 each 3    Sig: USE AS DIRECTED TO CHECK BLOOD SUGAR UP TO 6 TIMES PER DAY.     Endocrinology: Diabetes - Testing Supplies Passed - 03/30/2022  5:10 PM      Passed - Valid encounter within last 12 months    Recent Outpatient Visits           5 months ago Type 2 diabetes mellitus with other specified complication, with long-term current use of insulin ([place])   [place] Pickard, Cammie Mcgee, MD   1 year ago Morbid obesity [facility])   [place] Pickard, Cammie Mcgee, MD   1 year ago Fatigue, unspecified type   [place] Susy Frizzle, MD   1 year ago Acute non-recurrent maxillary sinusitis   [place] Eulogio Bear, NP   1 year ago Gastroparesis   [place] [facility] Schaumann, Cammie Mcgee, MD               Continuous Blood Gluc Sensor (DEXCOM G5 MOB/G4 PLAT SENSOR) MISC [Pharmacy Med Name: [place] 8 each 3    Sig: Use to check blood sugars up to 6 x per day.     Endocrinology: Diabetes - Testing Supplies Passed - 03/30/2022  5:10 PM      Passed - Valid encounter within last 12 months    Recent Outpatient Visits           5 months ago Type 2 diabetes mellitus with other specified complication, with long-term current use of insulin ([place])   [place] Pickard, Cammie Mcgee, MD   1 year ago Morbid obesity [facility])   [place] Susy Frizzle, MD   1 year ago Fatigue, unspecified type   [place] Susy Frizzle, MD   1 year ago Acute non-recurrent maxillary sinusitis   [place] Eulogio Bear, NP   1 year ago Gastroparesis   [place] Pickard, Cammie Mcgee, MD

## 2022-04-03 ENCOUNTER — Other Ambulatory Visit: Payer: Self-pay

## 2022-04-09 ENCOUNTER — Encounter: Payer: Self-pay | Admitting: Family Medicine

## 2022-04-11 ENCOUNTER — Other Ambulatory Visit: Payer: Self-pay

## 2022-04-11 MED ORDER — FLUTICASONE PROPIONATE 50 MCG/ACT NA SUSP: NASAL | 2 refills | Status: DC

## 2022-04-11 NOTE — Telephone Encounter (Signed)
Requested Prescriptions  Pending Prescriptions Disp Refills  . fluticasone (FLONASE) 50 MCG/ACT nasal spray 48 mL 2    Sig: SPRAY 2 SPRAYS INTO EACH NOSTRIL EVERY DAY     Ear, Nose, and Throat: Nasal Preparations - Corticosteroids Passed - 04/11/2022  1:15 PM      Passed - Valid encounter within last 12 months    Recent Outpatient Visits          6 months ago Type 2 diabetes mellitus with other specified complication, with long-term current use of insulin ([place])   [place] Pickard, Cammie Mcgee, MD   1 year ago Morbid obesity [facility])   [place] Pickard, Cammie Mcgee, MD   1 year ago Fatigue, unspecified type   [place] Susy Frizzle, MD   1 year ago Acute non-recurrent maxillary sinusitis   [place] Eulogio Bear, NP   1 year ago Gastroparesis   [place] Pickard, Cammie Mcgee, MD

## 2022-04-11 NOTE — Telephone Encounter (Signed)
Pharmacy faxed a refill request for fluticasone (FLONASE) 50 MCG/ACT nasal spray [[id number]]    Order Details Dose, Route, Frequency: As Directed  Dispense Quantity: 48 mL Refills: 2        Sig: SPRAY 2 SPRAYS INTO EACH NOSTRIL EVERY DAY       Start Date: 03/28/21 End Date: --  Written Date: 03/28/21 Expiration Date: 03/28/22  Original Order:  fluticasone (FLONASE) 50 MCG/ACT nasal spray [[id number]]

## 2022-04-13 ENCOUNTER — Ambulatory Visit: Payer: Medicaid Other | Admitting: Family Medicine

## 2022-04-13 VITALS — BP 124/72 | HR 75 | Ht 61.0 in | Wt 218.0 lb

## 2022-04-13 DIAGNOSIS — E1169 Type 2 diabetes mellitus with other specified complication: Secondary | ICD-10-CM

## 2022-04-13 DIAGNOSIS — Z794 Long term (current) use of insulin: Secondary | ICD-10-CM

## 2022-04-13 NOTE — Progress Notes (Signed)
Subjective:    Patient ID: Kimberly [name], female    DOB: 05/26/1971, 51 y.o.   MRN: [id number]  Wt Readings from Last 3 Encounters:  04/13/22 218 lb (98.9 kg)  10/05/21 226 lb 9.6 oz (102.8 kg)  03/30/21 256 lb (116.1 kg)  I have not seen the patient since April.  She has managed to lose another 8 pounds on Ozempic 2 mg weekly.  She has lost almost 40 pounds.  She states that at her worst she was over 270 pounds which means that she is lost almost 50 pounds.  I congratulated her on this.  She has reduced her Lantus 16 units a day due to hypoglycemic episodes.  In April her A1c was 5.8.  She is not checking her sugar.  She is uncertain what her sugar has been running.  She denies any polyuria polydipsia or blurry vision.  She stopped taking losartan.  Her blood pressure is excellent however still on atenolol.  She saw an eye doctor earlier this year and had an eye exam that was normal.  Diabetic foot exam was performed today and is normal  Past Medical History:  Diagnosis Date   Abnormal vaginal Pap smear    tx with cryotherapy   Anxiety    Depression    Diabetes mellitus without complication (HCC)    History of kidney stones    passed stone - no surgery   Hypertension    Migraine    otc med prn   Obesity    OSA (obstructive sleep apnea)    severe- RDI 43   Pseudotumor cerebri    SVD (spontaneous vaginal delivery)    x 3   Tension headache    Past Surgical History:  Procedure Laterality Date   ABDOMINAL HYSTERECTOMY     ANTERIOR CERVICAL DECOMP/DISCECTOMY FUSION N/A 01/03/2016   Procedure: ANTERIOR CERVICAL DECOMPRESSION FUSION CERVICAL FIVE-SIX,CERVICAL SIX-SEVEN.;  Surgeon: Lisbeth Renshaw, MD;  Location: [facility];  Service: Neurosurgery;  Laterality: N/A;  right side approach   CRYOTHERAPY     LAPAROSCOPIC ASSISTED VAGINAL HYSTERECTOMY Bilateral 04/21/2014   Procedure: LAPAROSCOPIC ASSISTED VAGINAL HYSTERECTOMY, BILATERAL SALPINGECTOMY;  Surgeon: Lavina Hamman,  MD;  Location: [facility];  Service: Gynecology;  Laterality: Bilateral;   MANDIBLE FRACTURE SURGERY  2002   x 1   TUBAL LIGATION  11/2003   WISDOM TOOTH EXTRACTION     Current Outpatient Medications on File Prior to Visit  Medication Sig Dispense Refill   ALPRAZolam (XANAX) 1 MG tablet TAKE 1 TABLET BY MOUTH 3 TIMES DAILY AS NEEDED. 90 tablet 3   atenolol (TENORMIN) 100 MG tablet Take 1 tablet (100 mg total) by mouth daily. 90 tablet 0   Blood Glucose Monitoring Suppl (ACCU-CHEK GUIDE ME) w/Device KIT 1 each by Does not apply route daily. 1 kit 0   buPROPion (WELLBUTRIN XL) 300 MG 24 hr tablet TAKE 1 TABLET BY MOUTH EVERY DAY (Patient taking differently: Take 300 mg by mouth daily.) 90 tablet 2   cetirizine (ZYRTEC) 10 MG tablet TAKE 1 TABLET BY MOUTH EVERY DAY 30 tablet 11   Continuous Blood Gluc Receiver (DEXCOM G5 MOBILE RECEIVER) [place] USE AS DIRECTED TO CHECK BLOOD SUGAR UP TO 6 TIMES PER DAY. 1 each 3   Continuous Blood Gluc Sensor (DEXCOM G5 MOB/G4 PLAT SENSOR) MISC USE TO CHECK BLOOD SUGARS UP TO 6 X PER DAY. 8 each 3   estradiol (VIVELLE-DOT) 0.075 MG/24HR Place onto the skin.     fluticasone (FLONASE)  50 MCG/ACT nasal spray SPRAY 2 SPRAYS INTO EACH NOSTRIL EVERY DAY 48 mL 2   glucose blood (ACCU-CHEK GUIDE) test strip USE AS INSTRUCTED TO TEST BLOOD SUGAR 5 TIMES DAILY. Dx: E11.65 100 strip 12   Insulin Disposable Pump (OMNIPOD DASH PODS, GEN 4,) MISC Inject 1 Piece into the skin every 30 (thirty) days. 10 each 11   insulin glargine (LANTUS SOLOSTAR) 100 UNIT/ML Solostar Pen Inject 25 Units into the skin daily. (Patient taking differently: Inject 10 Units into the skin daily.) 15 mL 3   Lancets Misc. (ACCU-CHEK FASTCLIX LANCET) KIT 1 each by Does not apply route daily. Use to check blood sugar 5 times daily. 1 kit 12   OZEMPIC, 1 MG/DOSE, 4 MG/3ML SOPN Inject 2 mg into the skin once a week. 6 mL 11   Vilazodone HCl (VIIBRYD) 40 MG TABS Take by mouth daily.     No current  facility-administered medications on file prior to visit.       No Known Allergies Social History   Socioeconomic History   Marital status: Divorced    Spouse name: Not on file   Number of children: Not on file   Years of education: Not on file   Highest education level: Not on file  Occupational History   Not on file  Tobacco Use   Smoking status: Never   Smokeless tobacco: Never  Substance and Sexual Activity   Alcohol use: Yes    Alcohol/week: 1.0 standard drink of alcohol    Types: 1 Glasses of wine per week    Comment: one glass of wine per week   Drug use: No   Sexual activity: Yes    Birth control/protection: Surgical  Other Topics Concern   Not on file  Social History Narrative   Not on file   Social Determinants of Health   Financial Resource Strain: Not on file  Food Insecurity: Not on file  Transportation Needs: Not on file  Physical Activity: Not on file  Stress: Not on file  Social Connections: Not on file  Intimate Partner Violence: Not on file     Review of Systems  All other systems reviewed and are negative.      Objective:   Physical Exam Vitals reviewed.  Constitutional:      General: She is not in acute distress.    Appearance: She is well-developed. She is not diaphoretic.  Eyes:     Conjunctiva/sclera: Conjunctivae normal.  Cardiovascular:     Rate and Rhythm: Normal rate and regular rhythm.     Heart sounds: Normal heart sounds.  Pulmonary:     Effort: Pulmonary effort is normal. No respiratory distress.     Breath sounds: Normal breath sounds. No wheezing or rales.  Abdominal:     General: Bowel sounds are normal. There is no distension.     Palpations: Abdomen is soft.     Tenderness: There is no abdominal tenderness. There is no guarding or rebound.  Musculoskeletal:     Cervical back: Neck supple.  Lymphadenopathy:     Cervical: No cervical adenopathy.           Assessment & Plan:  Type 2 diabetes mellitus with  other specified complication, with long-term current use of insulin (HCC) - Plan: CBC with Differential/Platelet, COMPLETE METABOLIC PANEL WITH GFR, Lipid panel, Hemoglobin A1c, Protein / Creatinine Ratio, Urine  Morbid obesity (HCC) I am very proud of the patient for losing weight.  We will continue Ozempic 2  mg subcu weekly.  I did discuss Mounjaro with her that she is doing well on Ozempic.  I believe she can stop insulin.  She is only on 10 units.  This would reduce the risk of hypoglycemic episodes.  It may also help facilitate some additional weight loss.  Therefore discontinue Lantus altogether.  Check an A1c.  As long as A1c is controlled I see no reason for her to continue with insulin.  I recommended weaning off atenolol by decreasing to 50 mg daily for 1 week then 50 mg every other day for 1 week then stop.  Once she is weaned off atenolol I would like her to resume losartan to help with renal protection and blood pressure.  I will check a urine protein creatinine ratio along with a fasting lipid panel

## 2022-04-14 ENCOUNTER — Encounter: Payer: Self-pay | Admitting: Family Medicine

## 2022-04-14 LAB — CBC WITH DIFFERENTIAL/PLATELET
Absolute Monocytes: 448 cells/uL (ref 200–950)
Basophils Absolute: 21 cells/uL (ref 0–200)
Basophils Relative: 0.3 %
Eosinophils Absolute: 7 cells/uL — ABNORMAL LOW (ref 15–500)
Eosinophils Relative: 0.1 %
HCT: 44 % (ref 35.0–45.0)
Hemoglobin: 14.4 g/dL (ref 11.7–15.5)
Lymphs Abs: 1960 cells/uL (ref 850–3900)
MCH: 28.9 pg (ref 27.0–33.0)
MCHC: 32.7 g/dL (ref 32.0–36.0)
MCV: 88.2 fL (ref 80.0–100.0)
MPV: 11.2 fL (ref 7.5–12.5)
Monocytes Relative: 6.4 %
Neutro Abs: 4564 cells/uL (ref 1500–7800)
Neutrophils Relative %: 65.2 %
Platelets: 168 10*3/uL (ref 140–400)
RBC: 4.99 10*6/uL (ref 3.80–5.10)
RDW: 12.9 % (ref 11.0–15.0)
Total Lymphocyte: 28 %
WBC: 7 10*3/uL (ref 3.8–10.8)

## 2022-04-14 LAB — PROTEIN / CREATININE RATIO, URINE
Creatinine, Urine: 224 mg/dL (ref 20–275)
Protein/Creat Ratio: 107 mg/g creat (ref 24–184)
Protein/Creatinine Ratio: 0.107 mg/mg creat (ref 0.024–0.184)
Total Protein, Urine: 24 mg/dL (ref 5–24)

## 2022-04-14 LAB — LIPID PANEL
Cholesterol: 221 mg/dL — ABNORMAL HIGH (ref <200)
HDL: 41 mg/dL — ABNORMAL LOW (ref >=50)
LDL Cholesterol (Calc): 151 mg/dL (calc) — ABNORMAL HIGH
Non-HDL Cholesterol (Calc): 180 mg/dL (calc) — ABNORMAL HIGH (ref <130)
Total CHOL/HDL Ratio: 5.4 (calc) — ABNORMAL HIGH (ref <5.0)
Triglycerides: 158 mg/dL — ABNORMAL HIGH (ref <150)

## 2022-04-14 LAB — COMPLETE METABOLIC PANEL WITH GFR
AG Ratio: 1.8 (calc) (ref 1.0–2.5)
ALT: 24 U/L (ref 6–29)
AST: 13 U/L (ref 10–35)
Albumin: 4.4 g/dL (ref 3.6–5.1)
Alkaline phosphatase (APISO): 65 U/L (ref 37–153)
BUN/Creatinine Ratio: 15 (calc) (ref 6–22)
BUN: 18 mg/dL (ref 7–25)
CO2: 26 mmol/L (ref 20–32)
Calcium: 9.5 mg/dL (ref 8.6–10.4)
Chloride: 110 mmol/L (ref 98–110)
Creat: 1.18 mg/dL — ABNORMAL HIGH (ref 0.50–1.03)
Globulin: 2.4 g/dL (calc) (ref 1.9–3.7)
Glucose, Bld: 109 mg/dL (ref 65–139)
Potassium: 4 mmol/L (ref 3.5–5.3)
Sodium: 146 mmol/L (ref 135–146)
Total Bilirubin: 0.5 mg/dL (ref 0.2–1.2)
Total Protein: 6.8 g/dL (ref 6.1–8.1)
eGFR: 56 mL/min/{1.73_m2} — ABNORMAL LOW (ref >=60)

## 2022-04-14 LAB — HEMOGLOBIN A1C
Hgb A1c MFr Bld: 5.8 % of total Hgb — ABNORMAL HIGH (ref <5.7)
Mean Plasma Glucose: 120 mg/dL
eAG (mmol/L): 6.6 mmol/L

## 2022-04-16 ENCOUNTER — Telehealth: Payer: Self-pay

## 2022-04-16 ENCOUNTER — Encounter: Payer: Self-pay | Admitting: *Deleted

## 2022-04-16 NOTE — Telephone Encounter (Signed)
Please advice  

## 2022-04-16 NOTE — Telephone Encounter (Signed)
Put PA form in Dr. Samella Parr green fold to be signed and completed.

## 2022-04-16 NOTE — Telephone Encounter (Signed)
Pls advice  

## 2022-04-18 ENCOUNTER — Encounter: Payer: Self-pay | Admitting: Family Medicine

## 2022-04-20 ENCOUNTER — Other Ambulatory Visit: Payer: Self-pay

## 2022-04-20 DIAGNOSIS — Z794 Long term (current) use of insulin: Secondary | ICD-10-CM

## 2022-04-20 MED ORDER — DEXCOM G5 MOB/G4 PLAT SENSOR MISC: 3 refills | Status: AC

## 2022-04-26 NOTE — Telephone Encounter (Signed)
Called spoke w/pt regarding providers recommendations. Told pt per Dr. Dennard Schaumann," Please see the result note on the labs from this morning, it addresses the cholesterol.  The hgb, hct, and plt counts are fine"   Pt voiced understanding  Nothing needed further.

## 2022-05-01 ENCOUNTER — Other Ambulatory Visit: Payer: Self-pay

## 2022-05-01 DIAGNOSIS — E7800 Pure hypercholesterolemia, unspecified: Secondary | ICD-10-CM

## 2022-05-01 DIAGNOSIS — E119 Type 2 diabetes mellitus without complications: Secondary | ICD-10-CM

## 2022-05-01 MED ORDER — ROSUVASTATIN CALCIUM 10 MG PO TABS: 10.0000 mg | ORAL_TABLET | Freq: Every day | ORAL | 3 refills | Status: DC

## 2022-05-07 ENCOUNTER — Telehealth: Payer: Self-pay

## 2022-05-07 ENCOUNTER — Other Ambulatory Visit: Payer: Self-pay | Admitting: Family Medicine

## 2022-05-07 MED ORDER — ALPRAZOLAM 1 MG PO TABS: 1.0000 mg | ORAL_TABLET | Freq: Three times a day (TID) | ORAL | 3 refills | Status: DC | PRN

## 2022-05-07 NOTE — Telephone Encounter (Signed)
  Prescription Request  05/07/2022  Is this a "Controlled Substance" medicine? Yes  LOV: 04/16/2022   What is the name of the medication or equipment? ALPRAZolam (XANAX) 1 MG tablet [[id number]]   Have you contacted your pharmacy to request a refill? Yes   Which pharmacy would you like this sent to?  [id number] [place] - [address] [place] [address] Phone: [phone] Fax: [phone]   Patient notified that their request is being sent to the clinical staff for review and that they should receive a response within 2 business days.   Please advise at [phone] (mobile)

## 2022-05-07 NOTE — Telephone Encounter (Signed)
Pt asks for a refill on her Alprazolam 1 mg. Last RF was 12/28/2021. [facility] [place]. Thank you!

## 2022-05-08 ENCOUNTER — Telehealth: Payer: Self-pay

## 2022-05-08 NOTE — Telephone Encounter (Signed)
Dr Tanya Nones, Pt asks via My Chart:  Can you ask him if Greggory Keen would be better for me as far as my A1C staying down and weight loss? I e read quite a bit up on it and it seems like it's better for your heart health than Ozempic. And I'm definitely interested in it if so.  Also, I was supposed to let him know when I had weined myself off of the blood pressure med. I was on (Altenolol) because he wanted me to change to Losartin. Main reason being heart health. I'm completely done with it so I'm ready to start taking the Losartin if he can call that in.   Thank you!

## 2022-05-09 ENCOUNTER — Telehealth: Payer: Self-pay

## 2022-05-09 NOTE — Telephone Encounter (Signed)
FREESTYLE LIBRE APPEAL WAS DENIED BY [employer]. PT AWARE. COPY OF DENIAL LETTER SCANNED INTO PATIENT'S CHART.

## 2022-05-10 ENCOUNTER — Other Ambulatory Visit: Payer: Self-pay

## 2022-05-10 DIAGNOSIS — I10 Essential (primary) hypertension: Secondary | ICD-10-CM

## 2022-05-10 MED ORDER — LOSARTAN POTASSIUM 50 MG PO TABS: 50.0000 mg | ORAL_TABLET | Freq: Every day | ORAL | 3 refills | Status: DC

## 2022-05-11 ENCOUNTER — Telehealth: Payer: Self-pay

## 2022-05-11 ENCOUNTER — Other Ambulatory Visit: Payer: Self-pay

## 2022-05-11 MED ORDER — ACCU-CHEK GUIDE VI STRP: ORAL_STRIP | 12 refills | Status: DC

## 2022-05-11 NOTE — Telephone Encounter (Signed)
  Prescription Request  05/11/2022  Is this a "Controlled Substance" medicine? No  LOV: 05/09/2022   What is the name of the medication or equipment? glucose blood (ACCU-CHEK GUIDE) test strip [[id number]]   Have you contacted your pharmacy to request a refill? Yes   Which pharmacy would you like this sent to?  [id number], [place] - [address] [place] [address] Phone: [phone] Fax: [phone]   Patient notified that their request is being sent to the clinical staff for review and that they should receive a response within 2 business days.   Please advise at [phone]

## 2022-05-28 ENCOUNTER — Other Ambulatory Visit: Payer: Self-pay | Admitting: Family Medicine

## 2022-06-01 ENCOUNTER — Other Ambulatory Visit: Payer: Self-pay | Admitting: Family Medicine

## 2022-06-01 NOTE — Telephone Encounter (Signed)
Called patient regarding medication refill . No refill doc. Since 2/23. Patient reports she is taking Ozempic 2mg  not 1 mg.

## 2022-06-01 NOTE — Telephone Encounter (Signed)
Requested medication (s) are due for refill today: na   Requested medication (s) are on the active medication list: no   Last refill:  na   Future visit scheduled: na  Notes to clinic:  called patient to verify dose . Patient is now taking Ozempic 2 mg and not 1 mg. Please advise new order     Requested Prescriptions  Pending Prescriptions Disp Refills   OZEMPIC, 1 MG/DOSE, 4 MG/3ML SOPN [Pharmacy Med Name: OZEMPIC 1 MG/DOSE (4 MG/3 ML)]  3    Sig: INJECT 1 MG AS DIRECTED ONCE A WEEK.     Endocrinology:  Diabetes - GLP-1 Receptor Agonists - semaglutide Failed - 06/01/2022 12:50 PM      Failed - HBA1C in normal range and within 180 days    Hgb A1C (fingerstick)  Date Value Ref Range Status  01/18/2015 9.8 (H) <5.7 % Final    Comment:                                                                           According to the ADA Clinical Practice Recommendations for 2011, when HbA1c is used as a screening test:     >=6.5%   Diagnostic of Diabetes Mellitus            (if abnormal result is confirmed)   5.7-6.4%   Increased risk of developing Diabetes Mellitus   References:Diagnosis and Classification of Diabetes Mellitus,Diabetes Care,2011,34(Suppl 1):S62-S69 and Standards of Medical Care in         Diabetes - 2011,Diabetes Care,2011,34 (Suppl 1):S11-S61.      Hgb A1c MFr Bld  Date Value Ref Range Status  04/13/2022 5.8 (H) <5.7 % of total Hgb Final    Comment:    For someone without known diabetes, a hemoglobin  A1c value between 5.7% and 6.4% is consistent with prediabetes and should be confirmed with a  follow-up test. . For someone with known diabetes, a value <7% indicates that their diabetes is well controlled. A1c targets should be individualized based on duration of diabetes, age, comorbid conditions, and other considerations. . This assay result is consistent with an increased risk of diabetes. . Currently, no consensus exists regarding use of hemoglobin  A1c for diagnosis of diabetes for children. .          Failed - Cr in normal range and within 360 days    Creat  Date Value Ref Range Status  04/13/2022 1.18 (H) 0.50 - 1.03 mg/dL Final   Creatinine,U  Date Value Ref Range Status  04/13/2020 144.3 mg/dL Final   Creatinine, Urine  Date Value Ref Range Status  04/13/2022 224 20 - 275 mg/dL Final         Failed - Valid encounter within last 6 months    Recent Outpatient Visits           7 months ago Type 2 diabetes mellitus with other specified complication, with long-term current use of insulin (HCC)   [facility] Pickard, Priscille Heidelberg, MD   1 year ago Morbid obesity [facility])   [facility] Pickard, Priscille Heidelberg, MD   1 year ago Fatigue, unspecified type   [facility] [place],  Priscille Heidelberg, MD   1 year ago Acute non-recurrent maxillary sinusitis   [facility] Valentino Nose, NP   2 years ago Gastroparesis   [facility] Pickard, Priscille Heidelberg, MD

## 2022-06-04 ENCOUNTER — Other Ambulatory Visit: Payer: Self-pay | Admitting: Family Medicine

## 2022-06-04 ENCOUNTER — Other Ambulatory Visit: Payer: Self-pay

## 2022-06-04 DIAGNOSIS — Z794 Long term (current) use of insulin: Secondary | ICD-10-CM

## 2022-06-04 DIAGNOSIS — E119 Type 2 diabetes mellitus without complications: Secondary | ICD-10-CM

## 2022-06-04 MED ORDER — TIRZEPATIDE 10 MG/0.5ML ~~LOC~~ SOAJ: 10.0000 mg | SUBCUTANEOUS | 1 refills | Status: DC

## 2022-07-01 ENCOUNTER — Encounter: Payer: Self-pay | Admitting: Family Medicine

## 2022-07-02 ENCOUNTER — Other Ambulatory Visit: Payer: Self-pay | Admitting: Family Medicine

## 2022-07-02 ENCOUNTER — Telehealth: Payer: Self-pay

## 2022-07-02 MED ORDER — FLUCONAZOLE 150 MG PO TABS: 150.0000 mg | ORAL_TABLET | Freq: Once | ORAL | 0 refills | Status: DC

## 2022-07-02 NOTE — Telephone Encounter (Signed)
Pt called with c/o yeast infection. Pt states she has tried OTC Monistat but without relief. Pt asks if Rx for Diflucan can be sent in for her? Thank you.   Pt would like to pick up on her lunch break, if possible.

## 2022-07-02 NOTE — Telephone Encounter (Signed)
Pt called with c/o yeast infection. Pt states she has tried OTC Monistat but without relief. Pt asks if Rx for Diflucan can be sent in for her? Thank you.

## 2022-07-16 ENCOUNTER — Other Ambulatory Visit: Payer: Medicaid Other

## 2022-07-16 ENCOUNTER — Other Ambulatory Visit: Payer: Self-pay | Admitting: Family Medicine

## 2022-07-18 ENCOUNTER — Encounter: Payer: Self-pay | Admitting: Family Medicine

## 2022-07-19 ENCOUNTER — Telehealth: Payer: Self-pay

## 2022-07-19 NOTE — Telephone Encounter (Signed)
My Chart message from patient:  Will you ask Dr. Dennard Schaumann when he thinks I can or should go up in my dosage of Mounjaro? I'm on the lowest dose now which is .25.   Thanks.

## 2022-08-21 ENCOUNTER — Ambulatory Visit: Payer: Medicaid Other | Admitting: Family Medicine

## 2022-08-21 ENCOUNTER — Telehealth: Payer: Self-pay | Admitting: Family Medicine

## 2022-08-21 ENCOUNTER — Other Ambulatory Visit: Payer: Self-pay | Admitting: Family Medicine

## 2022-08-21 MED ORDER — NIRMATRELVIR/RITONAVIR (PAXLOVID)TABLET: 3.0000 | ORAL_TABLET | Freq: Two times a day (BID) | ORAL | 0 refills | Status: AC

## 2022-08-21 NOTE — Telephone Encounter (Signed)
Patient called to report a positive COVID test result (taken 08/20/22).   Sx: nasal and chest congestion, fever, sore throat, body aches (sx worsening by the day; sore throat began Saturday, 08/18/22).  Patient requesting for provider to send script to pharmacy for [place].  [facility]  [id number] [place]Phone: [phone] Fax: [phone] #: [id number]   Please advise at [phone]

## 2022-08-26 ENCOUNTER — Other Ambulatory Visit: Payer: Self-pay | Admitting: Family Medicine

## 2022-08-31 ENCOUNTER — Encounter: Payer: Self-pay | Admitting: Family Medicine

## 2022-08-31 ENCOUNTER — Telehealth: Payer: Self-pay

## 2022-08-31 ENCOUNTER — Other Ambulatory Visit: Payer: Self-pay | Admitting: Family Medicine

## 2022-08-31 MED ORDER — ALPRAZOLAM 1 MG PO TABS: 1.0000 mg | ORAL_TABLET | Freq: Three times a day (TID) | ORAL | 3 refills | Status: DC | PRN

## 2022-08-31 NOTE — Telephone Encounter (Signed)
My Chart Message from patient:  I have been out of [facility] for going on 4 weeks now and every where I call, different branches of one pharmacy and different pharmacies, are all saying it's a manufacturer issue and it is all on back order .. cannot give me a date even possible for getting it in.   I have started getting like mild diabetic symptoms back as far as the constant thirst, excessive urinating, I've begun getting pain in my right foot. I just don't feel the same. I'm worried about going almost a month without it. Is there any way Dr. Dennard Schaumann would call me in [place] again and I do away with [facility]? Even when I was getting it, there was always atleast a week delay so I feel as if I haven't had anything regular like I did when I took ozempic. And I apologize, I know changing was my idea .   [facility] said they have a hard time of getting the '2mg'$  of Ozempic, could he possibly just call in the '1mg'$  and intake more? [facility] pharmacist suggested calling in a different dose so they could fill it as soon as possible?

## 2022-08-31 NOTE — Telephone Encounter (Signed)
Prescription Request  08/31/2022  LOV: 04/13/2022  What is the name of the medication or equipment?   ALPRAZolam (XANAX) 1 MG tablet  **PHARMACY REQUESTING A NEW SCRIPT**  Have you contacted your pharmacy to request a refill? Yes   Which pharmacy would you like this sent to?  [id number] [place] [address] [address]Phone: [phone]: [phone]   Patient notified that their request is being sent to the clinical staff for review and that they should receive a response within 2 business days.   Please advise pharmacist.

## 2022-09-03 ENCOUNTER — Other Ambulatory Visit: Payer: Self-pay | Admitting: Family Medicine

## 2022-09-03 ENCOUNTER — Other Ambulatory Visit: Payer: Self-pay

## 2022-09-03 DIAGNOSIS — Z794 Long term (current) use of insulin: Secondary | ICD-10-CM

## 2022-09-03 DIAGNOSIS — E119 Type 2 diabetes mellitus without complications: Secondary | ICD-10-CM

## 2022-09-03 MED ORDER — SEMAGLUTIDE (1 MG/DOSE) 4 MG/3ML ~~LOC~~ SOPN: 1.0000 mg | PEN_INJECTOR | SUBCUTANEOUS | 11 refills | Status: DC

## 2022-09-03 MED ORDER — SEMAGLUTIDE (2 MG/DOSE) 8 MG/3ML ~~LOC~~ SOPN: 2.0000 mg | PEN_INJECTOR | SUBCUTANEOUS | 3 refills | Status: DC

## 2022-09-06 ENCOUNTER — Other Ambulatory Visit: Payer: Self-pay | Admitting: Family Medicine

## 2022-09-06 MED ORDER — ALPRAZOLAM 1 MG PO TABS: 1.0000 mg | ORAL_TABLET | Freq: Three times a day (TID) | ORAL | 3 refills | Status: DC | PRN

## 2022-09-13 LAB — HM DIABETES EYE EXAM

## 2022-09-16 ENCOUNTER — Encounter: Payer: Self-pay | Admitting: Endocrinology

## 2022-09-17 ENCOUNTER — Encounter: Payer: Self-pay | Admitting: Family Medicine

## 2022-09-20 ENCOUNTER — Other Ambulatory Visit: Payer: Self-pay | Admitting: Family Medicine

## 2022-10-09 ENCOUNTER — Encounter: Payer: Self-pay | Admitting: Family Medicine

## 2022-10-15 ENCOUNTER — Ambulatory Visit: Payer: Medicaid Other | Admitting: Family Medicine

## 2022-10-18 ENCOUNTER — Other Ambulatory Visit: Payer: Self-pay

## 2022-10-18 ENCOUNTER — Other Ambulatory Visit: Payer: Self-pay | Admitting: Family Medicine

## 2022-10-18 ENCOUNTER — Encounter: Payer: Self-pay | Admitting: Family Medicine

## 2022-10-18 ENCOUNTER — Ambulatory Visit: Payer: Medicaid Other | Admitting: Family Medicine

## 2022-10-18 VITALS — BP 124/68 | HR 79 | Temp 98.4°F | Ht 61.0 in | Wt 194.4 lb

## 2022-10-18 DIAGNOSIS — M7989 Other specified soft tissue disorders: Secondary | ICD-10-CM | POA: Diagnosis not present

## 2022-10-18 DIAGNOSIS — E1169 Type 2 diabetes mellitus with other specified complication: Secondary | ICD-10-CM

## 2022-10-18 DIAGNOSIS — I10 Essential (primary) hypertension: Secondary | ICD-10-CM

## 2022-10-18 DIAGNOSIS — Z794 Long term (current) use of insulin: Secondary | ICD-10-CM

## 2022-10-18 DIAGNOSIS — E119 Type 2 diabetes mellitus without complications: Secondary | ICD-10-CM | POA: Diagnosis not present

## 2022-10-18 DIAGNOSIS — F321 Major depressive disorder, single episode, moderate: Secondary | ICD-10-CM

## 2022-10-18 DIAGNOSIS — E7800 Pure hypercholesterolemia, unspecified: Secondary | ICD-10-CM

## 2022-10-18 DIAGNOSIS — F419 Anxiety disorder, unspecified: Secondary | ICD-10-CM

## 2022-10-18 DIAGNOSIS — J0100 Acute maxillary sinusitis, unspecified: Secondary | ICD-10-CM

## 2022-10-18 MED ORDER — SEMAGLUTIDE (2 MG/DOSE) 8 MG/3ML ~~LOC~~ SOPN
2.0000 mg | PEN_INJECTOR | SUBCUTANEOUS | 3 refills | Status: DC
Start: 2022-10-18 — End: 2022-10-19

## 2022-10-18 MED ORDER — BUPROPION HCL ER (XL) 300 MG PO TB24
300.0000 mg | ORAL_TABLET | Freq: Every day | ORAL | 3 refills | Status: DC
Start: 2022-10-18 — End: 2022-10-19

## 2022-10-18 MED ORDER — CETIRIZINE HCL 10 MG PO TABS
10.0000 mg | ORAL_TABLET | Freq: Every day | ORAL | 3 refills | Status: DC
Start: 2022-10-18 — End: 2022-10-19

## 2022-10-18 MED ORDER — ATENOLOL 100 MG PO TABS
100.0000 mg | ORAL_TABLET | Freq: Every day | ORAL | 3 refills | Status: AC
Start: 2022-10-18 — End: ?

## 2022-10-18 MED ORDER — ROSUVASTATIN CALCIUM 10 MG PO TABS
10.0000 mg | ORAL_TABLET | Freq: Every day | ORAL | 3 refills | Status: DC
Start: 2022-10-18 — End: 2022-10-19

## 2022-10-18 MED ORDER — FLUTICASONE PROPIONATE 50 MCG/ACT NA SUSP
NASAL | 2 refills | Status: DC
Start: 2022-10-18 — End: 2022-10-19

## 2022-10-18 MED ORDER — OMNIPOD DASH PODS (GEN 4) MISC
1.0000 | 3 refills | Status: DC
Start: 2022-10-18 — End: 2022-10-19

## 2022-10-18 MED ORDER — SEMAGLUTIDE (2 MG/DOSE) 8 MG/3ML ~~LOC~~ SOPN
2.0000 mg | PEN_INJECTOR | SUBCUTANEOUS | 3 refills | Status: DC
Start: 2022-10-18 — End: 2022-10-18

## 2022-10-18 MED ORDER — LOSARTAN POTASSIUM 50 MG PO TABS
50.0000 mg | ORAL_TABLET | Freq: Every day | ORAL | 3 refills | Status: DC
Start: 2022-10-18 — End: 2022-10-19

## 2022-10-18 NOTE — Progress Notes (Signed)
Subjective:    Patient ID: Kimberly [name], female    DOB: 1971/05/04, 52 y.o.   MRN: [id number]  Wt Readings from Last 3 Encounters:  04/13/22 218 lb (98.9 kg)  10/05/21 226 lb 9.6 oz (102.8 kg)  03/30/21 256 lb (116.1 kg)   Patient has lost another 26 pounds and is down to 194.  Patient however has lost her insurance.  She is afraid she is going to be at work [employer].  She is not checking her blood sugars.  She denies any polyuria polydipsia but she does report dry mouth.  She is also been having more swelling in her legs recently.  Her blood pressure today is outstanding at 124/68. Past Medical History:  Diagnosis Date   Abnormal vaginal Pap smear    tx with cryotherapy   Anxiety    Depression    Diabetes mellitus without complication (HCC)    History of kidney stones    passed stone - no surgery   Hypertension    Migraine    otc med prn   Obesity    OSA (obstructive sleep apnea)    severe- RDI 43   Pseudotumor cerebri    SVD (spontaneous vaginal delivery)    x 3   Tension headache    Past Surgical History:  Procedure Laterality Date   ABDOMINAL HYSTERECTOMY     ANTERIOR CERVICAL DECOMP/DISCECTOMY FUSION N/A 01/03/2016   Procedure: ANTERIOR CERVICAL DECOMPRESSION FUSION CERVICAL FIVE-SIX,CERVICAL SIX-SEVEN.;  Surgeon: Lisbeth Renshaw, MD;  Location: [facility];  Service: Neurosurgery;  Laterality: N/A;  right side approach   CRYOTHERAPY     LAPAROSCOPIC ASSISTED VAGINAL HYSTERECTOMY Bilateral 04/21/2014   Procedure: LAPAROSCOPIC ASSISTED VAGINAL HYSTERECTOMY, BILATERAL SALPINGECTOMY;  Surgeon: Lavina Hamman, MD;  Location: [facility];  Service: Gynecology;  Laterality: Bilateral;   MANDIBLE FRACTURE SURGERY  2002   x 1   TUBAL LIGATION  11/2003   WISDOM TOOTH EXTRACTION     Current Outpatient Medications on File Prior to Visit  Medication Sig Dispense Refill   losartan (COZAAR) 50 MG tablet Take 1 tablet (50 mg total) by mouth daily. 90 tablet 3   ALPRAZolam  (XANAX) 1 MG tablet Take 1 tablet (1 mg total) by mouth 3 (three) times daily as needed. 90 tablet 3   atenolol (TENORMIN) 100 MG tablet TAKE 1 TABLET BY MOUTH EVERY DAY 90 tablet 0   Blood Glucose Monitoring Suppl (ACCU-CHEK GUIDE ME) w/Device KIT 1 each by Does not apply route daily. 1 kit 0   buPROPion (WELLBUTRIN XL) 300 MG 24 hr tablet TAKE 1 TABLET BY MOUTH EVERY DAY (Patient taking differently: Take 300 mg by mouth daily.) 90 tablet 2   cetirizine (ZYRTEC) 10 MG tablet TAKE 1 TABLET BY MOUTH EVERY DAY 30 tablet 11   Continuous Blood Gluc Receiver (DEXCOM G5 MOBILE RECEIVER) DEVI USE AS DIRECTED TO CHECK BLOOD SUGAR UP TO 6 TIMES PER DAY. 1 each 3   Continuous Blood Gluc Sensor (DEXCOM G5 MOB/G4 PLAT SENSOR) MISC USE TO CHECK BLOOD SUGARS UP TO 6 X PER DAY FOR INSULIN DEPENDANT DIABETIC. 8 each 3   estradiol (VIVELLE-DOT) 0.075 MG/24HR Place onto the skin.     fluticasone (FLONASE) 50 MCG/ACT nasal spray SPRAY 2 SPRAYS INTO EACH NOSTRIL EVERY DAY 48 mL 2   glucose blood (ACCU-CHEK GUIDE) test strip USE AS INSTRUCTED TO TEST BLOOD SUGAR 5 TIMES DAILY. Dx: E11.65 100 strip 12   Insulin Disposable Pump (OMNIPOD DASH PODS, GEN 4,) MISC  Inject 1 Piece into the skin every 30 (thirty) days. 10 each 11   insulin glargine (LANTUS SOLOSTAR) 100 UNIT/ML Solostar Pen INJECT 25 UNITS INTO THE SKIN DAILY. 15 mL 3   Lancets Misc. (ACCU-CHEK FASTCLIX LANCET) KIT 1 each by Does not apply route daily. Use to check blood sugar 5 times daily. 1 kit 12   rosuvastatin (CRESTOR) 10 MG tablet Take 1 tablet (10 mg total) by mouth daily. 90 tablet 3   Semaglutide, 2 MG/DOSE, 8 MG/3ML SOPN Inject 2 mg as directed once a week. 3 mL 3   Vilazodone HCl (VIIBRYD) 40 MG TABS Take by mouth daily.     No current facility-administered medications on file prior to visit.       No Known Allergies Social History   Socioeconomic History   Marital status: Divorced    Spouse name: Not on file   Number of children: Not on  file   Years of education: Not on file   Highest education level: Not on file  Occupational History   Not on file  Tobacco Use   Smoking status: Never   Smokeless tobacco: Never  Substance and Sexual Activity   Alcohol use: Yes    Alcohol/week: 1.0 standard drink of alcohol    Types: 1 Glasses of wine per week    Comment: one glass of wine per week   Drug use: No   Sexual activity: Yes    Birth control/protection: Surgical  Other Topics Concern   Not on file  Social History Narrative   Not on file   Social Determinants of Health   Financial Resource Strain: Not on file  Food Insecurity: Not on file  Transportation Needs: Not on file  Physical Activity: Not on file  Stress: Not on file  Social Connections: Not on file  Intimate Partner Violence: Not on file     Review of Systems  All other systems reviewed and are negative.      Objective:   Physical Exam Vitals reviewed.  Constitutional:      General: She is not in acute distress.    Appearance: She is well-developed. She is not diaphoretic.  Eyes:     Conjunctiva/sclera: Conjunctivae normal.  Cardiovascular:     Rate and Rhythm: Normal rate and regular rhythm.     Heart sounds: Normal heart sounds.  Pulmonary:     Effort: Pulmonary effort is normal. No respiratory distress.     Breath sounds: Normal breath sounds. No wheezing or rales.  Abdominal:     General: Bowel sounds are normal. There is no distension.     Palpations: Abdomen is soft.     Tenderness: There is no abdominal tenderness. There is no guarding or rebound.  Musculoskeletal:     Cervical back: Neck supple.  Lymphadenopathy:     Cervical: No cervical adenopathy.           Assessment & Plan:  Type 2 diabetes mellitus with other specified complication, with long-term current use of insulin - Plan: CBC with Differential/Platelet, COMPLETE METABOLIC PANEL WITH GFR, Lipid panel, Hemoglobin A1c, Protein / Creatinine Ratio, Urine,  Semaglutide, 2 MG/DOSE, 8 MG/3ML SOPN, DISCONTINUED: Semaglutide, 2 MG/DOSE, 8 MG/3ML SOPN  Morbid obesity  Insulin dependent type 2 diabetes mellitus - Plan: rosuvastatin (CRESTOR) 10 MG tablet, Semaglutide, 2 MG/DOSE, 8 MG/3ML SOPN, DISCONTINUED: Semaglutide, 2 MG/DOSE, 8 MG/3ML SOPN  Leg swelling - Plan: Brain natriuretic peptide  Hypertension, unspecified type - Plan: atenolol (TENORMIN) 100 MG tablet,  losartan (COZAAR) 50 MG tablet  Elevated cholesterol - Plan: rosuvastatin (CRESTOR) 10 MG tablet  Acute non-recurrent maxillary sinusitis - Plan: cetirizine (ZYRTEC) 10 MG tablet, fluticasone (FLONASE) 50 MCG/ACT nasal spray  Anxiety - Plan: buPROPion (WELLBUTRIN XL) 300 MG 24 hr tablet  Current moderate episode of major depressive disorder, unspecified whether recurrent - Plan: buPROPion (WELLBUTRIN XL) 300 MG 24 hr tablet I am extremely proud of how much weight she has lost.  If she is unable to afford the 90-day supply of Ozempic but I have sent while her insurance is still in place I plan to switch the patient to metformin.  The rest of her medications we will continue.  Her blood pressure is excellent.  Because of the swelling in her legs I will check a BNP.  If elevated we will need to obtain an echocardiogram.  Also check CBC CMP lipid panel A1c and urine protein to creatinine ratio

## 2022-10-19 ENCOUNTER — Other Ambulatory Visit: Payer: Self-pay | Admitting: Family Medicine

## 2022-10-19 ENCOUNTER — Other Ambulatory Visit: Payer: Self-pay

## 2022-10-19 ENCOUNTER — Telehealth: Payer: Self-pay

## 2022-10-19 DIAGNOSIS — Z6837 Body mass index (BMI) 37.0-37.9, adult: Secondary | ICD-10-CM

## 2022-10-19 DIAGNOSIS — J302 Other seasonal allergic rhinitis: Secondary | ICD-10-CM

## 2022-10-19 DIAGNOSIS — J45909 Unspecified asthma, uncomplicated: Secondary | ICD-10-CM

## 2022-10-19 DIAGNOSIS — E119 Type 2 diabetes mellitus without complications: Secondary | ICD-10-CM

## 2022-10-19 DIAGNOSIS — E7800 Pure hypercholesterolemia, unspecified: Secondary | ICD-10-CM

## 2022-10-19 DIAGNOSIS — F321 Major depressive disorder, single episode, moderate: Secondary | ICD-10-CM

## 2022-10-19 DIAGNOSIS — F419 Anxiety disorder, unspecified: Secondary | ICD-10-CM

## 2022-10-19 DIAGNOSIS — I10 Essential (primary) hypertension: Secondary | ICD-10-CM

## 2022-10-19 LAB — CBC WITH DIFFERENTIAL/PLATELET
Absolute Monocytes: 390 cells/uL (ref 200–950)
Basophils Absolute: 19 cells/uL (ref 0–200)
Basophils Relative: 0.4 %
Eosinophils Absolute: 89 cells/uL (ref 15–500)
Eosinophils Relative: 1.9 %
HCT: 39.8 % (ref 35.0–45.0)
Hemoglobin: 13.1 g/dL (ref 11.7–15.5)
Lymphs Abs: 1598 cells/uL (ref 850–3900)
MCH: 28.5 pg (ref 27.0–33.0)
MCHC: 32.9 g/dL (ref 32.0–36.0)
MCV: 86.7 fL (ref 80.0–100.0)
MPV: 11 fL (ref 7.5–12.5)
Monocytes Relative: 8.3 %
Neutro Abs: 2604 cells/uL (ref 1500–7800)
Neutrophils Relative %: 55.4 %
Platelets: 164 10*3/uL (ref 140–400)
RBC: 4.59 10*6/uL (ref 3.80–5.10)
RDW: 12.5 % (ref 11.0–15.0)
Total Lymphocyte: 34 %
WBC: 4.7 10*3/uL (ref 3.8–10.8)

## 2022-10-19 LAB — COMPLETE METABOLIC PANEL WITH GFR
AG Ratio: 1.7 (calc) (ref 1.0–2.5)
ALT: 19 U/L (ref 6–29)
AST: 13 U/L (ref 10–35)
Albumin: 4.2 g/dL (ref 3.6–5.1)
Alkaline phosphatase (APISO): 72 U/L (ref 37–153)
BUN/Creatinine Ratio: 13 (calc) (ref 6–22)
BUN: 15 mg/dL (ref 7–25)
CO2: 25 mmol/L (ref 20–32)
Calcium: 8.9 mg/dL (ref 8.6–10.4)
Chloride: 110 mmol/L (ref 98–110)
Creat: 1.2 mg/dL — ABNORMAL HIGH (ref 0.50–1.03)
Globulin: 2.5 g/dL (calc) (ref 1.9–3.7)
Glucose, Bld: 117 mg/dL — ABNORMAL HIGH (ref 65–99)
Potassium: 3.5 mmol/L (ref 3.5–5.3)
Sodium: 144 mmol/L (ref 135–146)
Total Bilirubin: 0.4 mg/dL (ref 0.2–1.2)
Total Protein: 6.7 g/dL (ref 6.1–8.1)
eGFR: 55 mL/min/{1.73_m2} — ABNORMAL LOW (ref >=60)

## 2022-10-19 LAB — HEMOGLOBIN A1C
Hgb A1c MFr Bld: 5.5 % of total Hgb (ref <5.7)
Mean Plasma Glucose: 111 mg/dL
eAG (mmol/L): 6.2 mmol/L

## 2022-10-19 LAB — LIPID PANEL
Cholesterol: 112 mg/dL (ref <200)
HDL: 33 mg/dL — ABNORMAL LOW (ref >=50)
LDL Cholesterol (Calc): 58 mg/dL (calc)
Non-HDL Cholesterol (Calc): 79 mg/dL (calc) (ref <130)
Total CHOL/HDL Ratio: 3.4 (calc) (ref <5.0)
Triglycerides: 133 mg/dL (ref <150)

## 2022-10-19 LAB — PROTEIN / CREATININE RATIO, URINE
Creatinine, Urine: 205 mg/dL (ref 20–275)
Protein/Creat Ratio: 141 mg/g creat (ref 24–184)
Protein/Creatinine Ratio: 0.141 mg/mg creat (ref 0.024–0.184)
Total Protein, Urine: 29 mg/dL — ABNORMAL HIGH (ref 5–24)

## 2022-10-19 LAB — BRAIN NATRIURETIC PEPTIDE: Brain Natriuretic Peptide: 18 pg/mL (ref <100)

## 2022-10-19 MED ORDER — ALPRAZOLAM 1 MG PO TABS: 1.0000 mg | ORAL_TABLET | Freq: Three times a day (TID) | ORAL | 3 refills | Status: DC | PRN

## 2022-10-19 MED ORDER — SEMAGLUTIDE (2 MG/DOSE) 8 MG/3ML ~~LOC~~ SOPN
2.0000 mg | PEN_INJECTOR | SUBCUTANEOUS | 1 refills | Status: DC
Start: 2022-10-19 — End: 2023-11-11

## 2022-10-19 MED ORDER — ROSUVASTATIN CALCIUM 10 MG PO TABS
10.0000 mg | ORAL_TABLET | Freq: Every day | ORAL | 1 refills | Status: DC
Start: 2022-10-19 — End: 2023-12-19

## 2022-10-19 MED ORDER — BUPROPION HCL ER (XL) 300 MG PO TB24
300.0000 mg | ORAL_TABLET | Freq: Every day | ORAL | 1 refills | Status: AC
Start: 2022-10-19 — End: ?

## 2022-10-19 MED ORDER — FLUTICASONE PROPIONATE 50 MCG/ACT NA SUSP
2.0000 | Freq: Every day | NASAL | 1 refills | Status: DC
Start: 2022-10-19 — End: 2022-10-19

## 2022-10-19 MED ORDER — LOSARTAN POTASSIUM 50 MG PO TABS
50.0000 mg | ORAL_TABLET | Freq: Every day | ORAL | 1 refills | Status: DC
Start: 2022-10-19 — End: 2023-12-19

## 2022-10-19 MED ORDER — LORATADINE 10 MG PO TABS
10.0000 mg | ORAL_TABLET | Freq: Every day | ORAL | 1 refills | Status: DC
Start: 2022-10-19 — End: 2024-02-13

## 2022-10-19 NOTE — Telephone Encounter (Signed)
Requested medications are due for refill today.  See pharmacy note  Requested medications are on the active medications list.  yes  Last refill. 10/18/2022  Future visit scheduled.   no  Notes to clinic.  Pharmacy comment: Script Clarification:PLEASE RESEND WITH CORRECT QUANTITY AND DIRECTIONS.     Requested Prescriptions  Pending Prescriptions Disp Refills   Insulin Disposable Pump (OMNIPOD DASH PODS, GEN 4,) MISC [Pharmacy Med Name: OMNIPOD DASH PODS (GEN 4) 5PK] 3 each 3    Sig: Inject 1 Piece into the skin every 30 (thirty) days.     Endocrinology: Diabetes - Testing Supplies Failed - 10/18/2022  5:44 PM      Failed - Valid encounter within last 12 months    Recent Outpatient Visits           1 year ago Type 2 diabetes mellitus with other specified complication, with long-term current use of insulin (HCC)   [facility] Pickard, Priscille Heidelberg, MD   1 year ago Morbid obesity [facility])   [facility] Pickard, Priscille Heidelberg, MD   1 year ago Fatigue, unspecified type   [facility] Donita Brooks, MD   1 year ago Acute non-recurrent maxillary sinusitis   [facility] Valentino Nose, NP   2 years ago Gastroparesis   [facility] Pickard, Priscille Heidelberg, MD

## 2022-10-19 NOTE — Telephone Encounter (Signed)
Pt is in need of a refill on her Alprazolam, 90 day supply if possible because she will be out of insurance at the end of this month. Last RF 09/06/2022. Thanks.

## 2022-10-22 ENCOUNTER — Other Ambulatory Visit: Payer: Self-pay | Admitting: Family Medicine

## 2022-10-22 MED ORDER — ACETAZOLAMIDE 250 MG PO TABS: 250.0000 mg | ORAL_TABLET | Freq: Two times a day (BID) | ORAL | 3 refills | Status: DC

## 2022-12-24 ENCOUNTER — Encounter: Payer: Self-pay | Admitting: Family Medicine

## 2023-01-01 ENCOUNTER — Telehealth: Payer: Self-pay

## 2023-01-01 NOTE — Telephone Encounter (Signed)
OZMEPIC PA SENT, PT AWARE. MJP,LPN  Alajiah Recine (Key: [id number])  Your information has been sent to [facility].

## 2023-01-08 ENCOUNTER — Telehealth: Payer: Self-pay

## 2023-01-08 NOTE — Telephone Encounter (Signed)
Pt's Ozempic PA is pending approval. Pt asks via My Chart message:  Do you think the Greggory Keen would be approved or they would also need prior approval or proof I'm diabetic with them as well?  If you think the Greggory Keen will be approved and take less time then please send it. Just getting really frustrated.

## 2023-01-14 ENCOUNTER — Telehealth: Payer: Self-pay

## 2023-01-14 ENCOUNTER — Other Ambulatory Visit: Payer: Self-pay | Admitting: Family Medicine

## 2023-01-14 MED ORDER — ALPRAZOLAM 1 MG PO TABS: 1.0000 mg | ORAL_TABLET | Freq: Three times a day (TID) | ORAL | 0 refills | Status: DC | PRN

## 2023-01-14 NOTE — Telephone Encounter (Signed)
Prescription Request  01/14/2023  LOV: 10/18/22  What is the name of the medication or equipment? ALPRAZolam (XANAX) 1 MG tablet [[id number]]  Have you contacted your pharmacy to request a refill? Yes   Which pharmacy would you like this sent to?  [id number]ann Sheen, [place] - [address] [place] [address] Phone: [phone] Fax: [phone]    Patient notified that their request is being sent to the clinical staff for review and that they should receive a response within 2 business days.   Please advise at [facility] [phone]

## 2023-02-11 ENCOUNTER — Other Ambulatory Visit: Payer: Self-pay

## 2023-02-11 ENCOUNTER — Telehealth: Payer: Self-pay | Admitting: Family Medicine

## 2023-02-11 DIAGNOSIS — I10 Essential (primary) hypertension: Secondary | ICD-10-CM

## 2023-02-11 MED ORDER — ACETAZOLAMIDE 250 MG PO TABS
250.0000 mg | ORAL_TABLET | Freq: Two times a day (BID) | ORAL | 3 refills | Status: DC
Start: 2023-02-11 — End: 2023-06-27

## 2023-02-11 NOTE — Telephone Encounter (Signed)
Prescription Request  02/11/2023  LOV: 10/18/2022  What is the name of the medication or equipment?   acetaZOLAMIDE (DIAMOX) 250 MG tablet   Have you contacted your pharmacy to request a refill? Yes   Which pharmacy would you like this sent to?  [id number] Sheen, [place] - [address] [place] [address] Phone: [phone] Fax: [phone]    Patient notified that their request is being sent to the clinical staff for review and that they should receive a response within 2 business days.   Please advise pharmacist.

## 2023-02-14 ENCOUNTER — Other Ambulatory Visit: Payer: Self-pay | Admitting: Family Medicine

## 2023-02-14 ENCOUNTER — Encounter: Payer: Self-pay | Admitting: Family Medicine

## 2023-02-14 MED ORDER — FLUCONAZOLE 150 MG PO TABS: 150.0000 mg | ORAL_TABLET | Freq: Once | ORAL | 0 refills | Status: AC

## 2023-03-08 ENCOUNTER — Other Ambulatory Visit: Payer: Self-pay | Admitting: Family Medicine

## 2023-03-08 MED ORDER — ALPRAZOLAM 1 MG PO TABS: 1.0000 mg | ORAL_TABLET | Freq: Three times a day (TID) | ORAL | 0 refills | Status: DC | PRN

## 2023-03-08 NOTE — Telephone Encounter (Signed)
Prescription Request  03/08/2023  LOV: 10/18/2022  What is the name of the medication or equipment?   ALPRAZolam Prudy Feeler) 1 MG tablet   Have you contacted your pharmacy to request a refill? Yes   Which pharmacy would you like this sent to?  [id number] Sheen, [place] - [address] [place] [address] Phone: [phone] Fax: [phone]    Patient notified that their request is being sent to the clinical staff for review and that they should receive a response within 2 business days.   Please advise pharmacist

## 2023-03-29 ENCOUNTER — Encounter: Payer: Self-pay | Admitting: Family Medicine

## 2023-03-29 ENCOUNTER — Ambulatory Visit: Payer: Medicaid Other | Admitting: Family Medicine

## 2023-03-29 VITALS — BP 128/76 | HR 76 | Ht 61.0 in | Wt 193.6 lb

## 2023-03-29 DIAGNOSIS — K5904 Chronic idiopathic constipation: Secondary | ICD-10-CM

## 2023-03-29 MED ORDER — METRONIDAZOLE 500 MG PO TABS: 500.0000 mg | ORAL_TABLET | Freq: Two times a day (BID) | ORAL | 0 refills | Status: AC

## 2023-03-29 NOTE — Progress Notes (Signed)
Subjective:    Patient ID: Kimberly [name], female    DOB: [birth year], 52 y.o.   MRN: [id number] Patient reports no bowel movement in 10 days.  Reports abdominal cramping, bloating, and feeling full.  Having discomfort in low back.  Denies melena or hematochezia or fever/chills. States she has dealt with this all her life.  Past Medical History:  Diagnosis Date  . Abnormal vaginal Pap smear    tx with cryotherapy  . Anxiety   . Depression   . Diabetes mellitus without complication (HCC)   . History of kidney stones    passed stone - no surgery  . Hypertension   . Migraine    otc med prn  . Obesity   . OSA (obstructive sleep apnea)    severe- RDI 43  . Pseudotumor cerebri   . SVD (spontaneous vaginal delivery)    x 3  . Tension headache    Past Surgical History:  Procedure Laterality Date  . ABDOMINAL HYSTERECTOMY    . ANTERIOR CERVICAL DECOMP/DISCECTOMY FUSION N/A 01/03/2016   Procedure: ANTERIOR CERVICAL DECOMPRESSION FUSION CERVICAL FIVE-SIX,CERVICAL SIX-SEVEN.;  Surgeon: Lisbeth Renshaw, MD;  Location: [facility];  Service: Neurosurgery;  Laterality: N/A;  right side approach  . CRYOTHERAPY    . LAPAROSCOPIC ASSISTED VAGINAL HYSTERECTOMY Bilateral 04/21/2014   Procedure: LAPAROSCOPIC ASSISTED VAGINAL HYSTERECTOMY, BILATERAL SALPINGECTOMY;  Surgeon: Lavina Hamman, MD;  Location: [facility];  Service: Gynecology;  Laterality: Bilateral;  . MANDIBLE FRACTURE SURGERY  2002   x 1  . TUBAL LIGATION  11/2003  . WISDOM TOOTH EXTRACTION     Current Outpatient Medications on File Prior to Visit  Medication Sig Dispense Refill  . acetaZOLAMIDE (DIAMOX) 250 MG tablet Take 1 tablet (250 mg total) by mouth 2 (two) times daily. 60 tablet 3  . ALPRAZolam (XANAX) 1 MG tablet Take 1 tablet (1 mg total) by mouth 3 (three) times daily as needed. 90 tablet 0  . atenolol (TENORMIN) 100 MG tablet Take 1 tablet (100 mg total) by mouth daily. 90 tablet 3  . Blood Glucose Monitoring Suppl  (ACCU-CHEK GUIDE ME) w/Device KIT 1 each by Does not apply route daily. 1 kit 0  . buPROPion (WELLBUTRIN XL) 300 MG 24 hr tablet Take 1 tablet (300 mg total) by mouth daily. 90 tablet 1  . estradiol (VIVELLE-DOT) 0.075 MG/24HR Place onto the skin.    . fluticasone (FLONASE) 50 MCG/ACT nasal spray SPRAY 2 SPRAYS INTO EACH NOSTRIL EVERY DAY 48 mL 2  . glucose blood (ACCU-CHEK GUIDE) test strip USE AS INSTRUCTED TO TEST BLOOD SUGAR 5 TIMES DAILY. Dx: E11.65 100 strip 12  . Insulin Disposable Pump (OMNIPOD DASH PODS, GEN 4,) MISC INJECT 1 PIECE INTO THE SKIN EVERY 30 (THIRTY) DAYS. 3 each 3  . Lancets Misc. (ACCU-CHEK FASTCLIX LANCET) KIT 1 each by Does not apply route daily. Use to check blood sugar 5 times daily. 1 kit 12  . loratadine (CLARITIN) 10 MG tablet Take 1 tablet (10 mg total) by mouth daily. 90 tablet 1  . losartan (COZAAR) 50 MG tablet Take 1 tablet (50 mg total) by mouth daily. 90 tablet 1  . rosuvastatin (CRESTOR) 10 MG tablet Take 1 tablet (10 mg total) by mouth daily. 90 tablet 1  . Semaglutide, 2 MG/DOSE, 8 MG/3ML SOPN Inject 2 mg as directed once a week. 9 mL 1  . Vilazodone HCl (VIIBRYD) 40 MG TABS Take by mouth daily.    . Continuous Blood Gluc Receiver [facility]  G5 MOBILE RECEIVER) DEVI USE AS DIRECTED TO CHECK BLOOD SUGAR UP TO 6 TIMES PER DAY. (Patient not taking: Reported on 03/29/2023) 1 each 3  . Continuous Blood Gluc Sensor (DEXCOM G5 MOB/G4 PLAT SENSOR) MISC USE TO CHECK BLOOD SUGARS UP TO 6 X PER DAY FOR INSULIN DEPENDANT DIABETIC. (Patient not taking: Reported on 03/29/2023) 8 each 3   No current facility-administered medications on file prior to visit.       No Known Allergies Social History   Socioeconomic History  . Marital status: Divorced    Spouse name: Not on file  . Number of children: Not on file  . Years of education: Not on file  . Highest education level: Not on file  Occupational History  . Not on file  Tobacco Use  . Smoking status: Never  .  Smokeless tobacco: Never  Substance and Sexual Activity  . Alcohol use: Yes    Alcohol/week: 1.0 standard drink of alcohol    Types: 1 Glasses of wine per week    Comment: one glass of wine per week  . Drug use: No  . Sexual activity: Yes    Birth control/protection: Surgical  Other Topics Concern  . Not on file  Social History Narrative  . Not on file   Social Determinants of Health   Financial Resource Strain: Not on file  Food Insecurity: Not on file  Transportation Needs: Not on file  Physical Activity: Not on file  Stress: Not on file  Social Connections: Not on file  Intimate Partner Violence: Not on file     Review of Systems  All other systems reviewed and are negative.      Objective:   Physical Exam Vitals reviewed.  Constitutional:      General: She is not in acute distress.    Appearance: She is well-developed. She is not diaphoretic.  Cardiovascular:     Rate and Rhythm: Normal rate and regular rhythm.     Heart sounds: Normal heart sounds.  Pulmonary:     Effort: Pulmonary effort is normal. No respiratory distress.     Breath sounds: Normal breath sounds. No wheezing or rales.  Abdominal:     General: Bowel sounds are normal. There is no distension.     Palpations: Abdomen is soft.     Tenderness: There is no abdominal tenderness. There is no guarding or rebound.  Lymphadenopathy:     Cervical: No cervical adenopathy.          Assessment & Plan:  Chronic idiopathic constipation Use miralax TID and fleets enema 2-3 times a day to soften hard stool from above and below.  If no bowel movement after doing this for 2 days add dulcolax as well as a stimulant.  Repeat daily until defecation occurs.  Once relieved, use linzess 290 mcg poqday as a preventative.

## 2023-04-04 ENCOUNTER — Telehealth: Payer: Self-pay

## 2023-04-04 ENCOUNTER — Other Ambulatory Visit: Payer: Self-pay

## 2023-04-04 ENCOUNTER — Encounter: Payer: Self-pay | Admitting: Family Medicine

## 2023-04-04 DIAGNOSIS — Z794 Long term (current) use of insulin: Secondary | ICD-10-CM

## 2023-04-04 MED ORDER — OMNIPOD DASH PODS (GEN 4) MISC
1.0000 | 3 refills | Status: DC
Start: 2023-04-04 — End: 2023-04-05

## 2023-04-04 NOTE — Telephone Encounter (Signed)
Prescription Request  04/04/2023  LOV: 03/29/23  What is the name of the medication or equipment? Insulin Disposable Pump (OMNIPOD DASH PODS, GEN 4,) MISC [[id number]]  Have you contacted your pharmacy to request a refill? Yes   Which pharmacy would you like this sent to?  [id number], [place] - [address] [place] [address] Phone: [phone] Fax: [phone]    Patient notified that their request is being sent to the clinical staff for review and that they should receive a response within 2 business days.   Please advise at [facility] [phone]

## 2023-04-04 NOTE — Telephone Encounter (Signed)
Prescription Request  04/04/2023  LOV: 03/29/23  What is the name of the medication or equipment? ALPRAZolam (XANAX) 1 MG tablet [[id number]]  Have you contacted your pharmacy to request a refill? Yes   Which pharmacy would you like this sent to?  [id number], [place] - [address] [place] [address] Phone: [phone] Fax: [phone]    Patient notified that their request is being sent to the clinical staff for review and that they should receive a response within 2 business days.   Please advise at [facility] [phone]

## 2023-04-05 ENCOUNTER — Emergency Department (HOSPITAL_BASED_OUTPATIENT_CLINIC_OR_DEPARTMENT_OTHER)
Admission: EM | Admit: 2023-04-05 | Discharge: 2023-04-05 | Disposition: A | Discharge status: Home / Self Care | Payer: No Typology Code available for payment source | Attending: Emergency Medicine | Admitting: Emergency Medicine

## 2023-04-05 ENCOUNTER — Encounter (HOSPITAL_BASED_OUTPATIENT_CLINIC_OR_DEPARTMENT_OTHER): Payer: Self-pay | Admitting: Emergency Medicine

## 2023-04-05 ENCOUNTER — Other Ambulatory Visit: Payer: Self-pay | Admitting: Family Medicine

## 2023-04-05 ENCOUNTER — Emergency Department (HOSPITAL_BASED_OUTPATIENT_CLINIC_OR_DEPARTMENT_OTHER): Payer: No Typology Code available for payment source

## 2023-04-05 ENCOUNTER — Other Ambulatory Visit: Payer: Self-pay

## 2023-04-05 DIAGNOSIS — N132 Hydronephrosis with renal and ureteral calculous obstruction: Secondary | ICD-10-CM | POA: Diagnosis not present

## 2023-04-05 DIAGNOSIS — Z794 Long term (current) use of insulin: Secondary | ICD-10-CM | POA: Insufficient documentation

## 2023-04-05 DIAGNOSIS — N200 Calculus of kidney: Secondary | ICD-10-CM

## 2023-04-05 DIAGNOSIS — K5900 Constipation, unspecified: Secondary | ICD-10-CM | POA: Diagnosis present

## 2023-04-05 DIAGNOSIS — E119 Type 2 diabetes mellitus without complications: Secondary | ICD-10-CM | POA: Insufficient documentation

## 2023-04-05 DIAGNOSIS — E1169 Type 2 diabetes mellitus with other specified complication: Secondary | ICD-10-CM

## 2023-04-05 LAB — CBC
HCT: 40.6 % (ref 36.0–46.0)
Hemoglobin: 13.4 g/dL (ref 12.0–15.0)
MCH: 28.3 pg (ref 26.0–34.0)
MCHC: 33 g/dL (ref 30.0–36.0)
MCV: 85.8 fL (ref 80.0–100.0)
Platelets: 165 10*3/uL (ref 150–400)
RBC: 4.73 MIL/uL (ref 3.87–5.11)
RDW: 12.8 % (ref 11.5–15.5)
WBC: 6.9 10*3/uL (ref 4.0–10.5)
nRBC: 0 % (ref 0.0–0.2)

## 2023-04-05 LAB — URINALYSIS, ROUTINE W REFLEX MICROSCOPIC
Bilirubin Urine: NEGATIVE
Glucose, UA: NEGATIVE mg/dL
Ketones, ur: NEGATIVE mg/dL
Nitrite: NEGATIVE
Specific Gravity, Urine: 1.023 (ref 1.005–1.030)
pH: 5.5 (ref 5.0–8.0)

## 2023-04-05 LAB — COMPREHENSIVE METABOLIC PANEL
ALT: 27 U/L (ref 0–44)
AST: 16 U/L (ref 15–41)
Albumin: 4.3 g/dL (ref 3.5–5.0)
Alkaline Phosphatase: 65 U/L (ref 38–126)
Anion gap: 7 (ref 5–15)
BUN: 16 mg/dL (ref 6–20)
CO2: 25 mmol/L (ref 22–32)
Calcium: 9.4 mg/dL (ref 8.9–10.3)
Chloride: 109 mmol/L (ref 98–111)
Creatinine, Ser: 1.29 mg/dL — ABNORMAL HIGH (ref 0.44–1.00)
GFR, Estimated: 50 mL/min — ABNORMAL LOW (ref >60)
Glucose, Bld: 78 mg/dL (ref 70–99)
Potassium: 3.7 mmol/L (ref 3.5–5.1)
Sodium: 141 mmol/L (ref 135–145)
Total Bilirubin: 0.6 mg/dL (ref 0.3–1.2)
Total Protein: 6.9 g/dL (ref 6.5–8.1)

## 2023-04-05 LAB — LIPASE, BLOOD: Lipase: 11 U/L (ref 11–51)

## 2023-04-05 MED ORDER — SODIUM CHLORIDE 0.9 % IV SOLN
1.0000 g | Freq: Once | INTRAVENOUS | Status: AC
  Administered 2023-04-05: 1 g via INTRAVENOUS
  Filled 2023-04-05: qty 10

## 2023-04-05 MED ORDER — IOHEXOL 300 MG/ML  SOLN
100.0000 mL | Freq: Once | INTRAMUSCULAR | Status: AC | PRN
  Administered 2023-04-05: 80 mL via INTRAVENOUS

## 2023-04-05 MED ORDER — CEPHALEXIN 500 MG PO CAPS: 500.0000 mg | ORAL_CAPSULE | Freq: Four times a day (QID) | ORAL | 0 refills | Status: DC

## 2023-04-05 MED ORDER — OMNIPOD DASH PODS (GEN 4) MISC
1.0000 | 3 refills | Status: DC
Start: 2023-04-05 — End: 2023-06-03

## 2023-04-05 MED ORDER — ALPRAZOLAM 1 MG PO TABS: 1.0000 mg | ORAL_TABLET | Freq: Three times a day (TID) | ORAL | 0 refills | Status: DC | PRN

## 2023-04-05 MED ORDER — TAMSULOSIN HCL 0.4 MG PO CAPS: 0.4000 mg | ORAL_CAPSULE | Freq: Every day | ORAL | 0 refills | Status: DC

## 2023-04-05 NOTE — ED Triage Notes (Signed)
Pt c/o generalized ABD pain x 3 weeks, constipation x 4 weeks, taking laxatives and using enemas with no results until 4 days pta, clear liquid BM.Endorse sn/v

## 2023-04-05 NOTE — ED Provider Notes (Signed)
[place] EMERGENCY DEPARTMENT AT [facility] Provider Note   CSN: [id number] Arrival date & time: 04/05/23  1730     History  Chief Complaint  Patient presents with   Abdominal Pain    Kimberly [name] is a 52 y.o. female.  She has a history of diabetes and is on Ozempic.  She is complaining of constipation for the last 4 weeks.  She said she seen her doctor for this multiple times and has tried multiple medications including magnesium citrate, laxatives, enemas.  She normally is constipated has a bowel movement every 4 days.  They even tried her on Linzess recently without any improvement.  Now she is having right sided abdominal pain and vomited once.  No fever.  No rectal pain.  Prior history of hysterectomy  The history is provided by the patient.  Abdominal Pain Pain location:  Generalized Pain quality: aching and burning   Pain severity:  Moderate Onset quality:  Gradual Timing:  Constant Progression:  Worsening Chronicity:  New Relieved by:  Nothing Ineffective treatments: Laxatives. Associated symptoms: constipation, nausea and vomiting   Associated symptoms: no chest pain, no chills, no cough, no dysuria, no fever, no hematemesis and no shortness of breath        Home Medications Prior to Admission medications   Medication Sig Start Date End Date Taking? Authorizing Provider  acetaZOLAMIDE (DIAMOX) 250 MG tablet Take 1 tablet (250 mg total) by mouth 2 (two) times daily. 02/11/23   Donita Brooks, MD  ALPRAZolam Prudy Feeler) 1 MG tablet Take 1 tablet (1 mg total) by mouth 3 (three) times daily as needed. 04/05/23   Donita Brooks, MD  atenolol (TENORMIN) 100 MG tablet Take 1 tablet (100 mg total) by mouth daily. 10/18/22   Donita Brooks, MD  Blood Glucose Monitoring Suppl (ACCU-CHEK GUIDE ME) w/Device KIT 1 each by Does not apply route daily. 10/10/17   Reather Littler, MD  buPROPion (WELLBUTRIN XL) 300 MG 24 hr tablet Take 1 tablet (300 mg total) by mouth  daily. 10/19/22   Donita Brooks, MD  Continuous Blood Gluc Receiver (DEXCOM G5 MOBILE RECEIVER) DEVI USE AS DIRECTED TO CHECK BLOOD SUGAR UP TO 6 TIMES PER DAY. Patient not taking: Reported on 03/29/2023 03/30/22   Donita Brooks, MD  Continuous Blood Gluc Sensor (DEXCOM G5 MOB/G4 PLAT SENSOR) MISC USE TO CHECK BLOOD SUGARS UP TO 6 X PER DAY FOR INSULIN DEPENDANT DIABETIC. Patient not taking: Reported on 03/29/2023 04/20/22   Donita Brooks, MD  estradiol (VIVELLE-DOT) 0.075 MG/24HR Place onto the skin. 06/22/21   [provider]  fluticasone (FLONASE) 50 MCG/ACT nasal spray SPRAY 2 SPRAYS INTO EACH NOSTRIL EVERY DAY 10/19/22   Donita Brooks, MD  glucose blood (ACCU-CHEK GUIDE) test strip USE AS INSTRUCTED TO TEST BLOOD SUGAR 5 TIMES DAILY. Dx: E11.65 05/11/22   Donita Brooks, MD  Insulin Disposable Pump (OMNIPOD DASH PODS, GEN 4,) MISC Inject 1 Piece into the skin every 3 (three) days. 04/05/23 05/05/23  Donita Brooks, MD  Lancets Misc. (ACCU-CHEK FASTCLIX LANCET) KIT 1 each by Does not apply route daily. Use to check blood sugar 5 times daily. 10/10/17   Reather Littler, MD  loratadine (CLARITIN) 10 MG tablet Take 1 tablet (10 mg total) by mouth daily. 10/19/22   Donita Brooks, MD  losartan (COZAAR) 50 MG tablet Take 1 tablet (50 mg total) by mouth daily. 10/19/22   Donita Brooks, MD  metroNIDAZOLE (FLAGYL)  500 MG tablet Take 1 tablet (500 mg total) by mouth 2 (two) times daily for 7 days. 03/29/23 04/05/23  Donita Brooks, MD  rosuvastatin (CRESTOR) 10 MG tablet Take 1 tablet (10 mg total) by mouth daily. 10/19/22   Donita Brooks, MD  Semaglutide, 2 MG/DOSE, 8 MG/3ML SOPN Inject 2 mg as directed once a week. 10/19/22   Donita Brooks, MD  Vilazodone HCl (VIIBRYD) 40 MG TABS Take by mouth daily.    [provider]      Allergies    Patient has no known allergies.    Review of Systems   Review of Systems  Constitutional:  Negative for chills and  fever.  Respiratory:  Negative for cough and shortness of breath.   Cardiovascular:  Negative for chest pain.  Gastrointestinal:  Positive for abdominal pain, constipation, nausea and vomiting. Negative for hematemesis.  Genitourinary:  Negative for dysuria.    Physical Exam Updated Vital Signs BP 137/86   Pulse 72   Temp 98.2 F (36.8 C)   Resp 20   Wt 85.7 kg   LMP 09/23/2013   SpO2 98%   BMI 35.71 kg/m  Physical Exam Vitals and nursing note reviewed.  Constitutional:      General: She is not in acute distress.    Appearance: Normal appearance. She is well-developed.  HENT:     Head: Normocephalic and atraumatic.  Eyes:     Conjunctiva/sclera: Conjunctivae normal.  Cardiovascular:     Rate and Rhythm: Normal rate and regular rhythm.     Heart sounds: No murmur heard. Pulmonary:     Effort: Pulmonary effort is normal. No respiratory distress.     Breath sounds: Normal breath sounds.  Abdominal:     Palpations: Abdomen is soft.     Tenderness: There is no abdominal tenderness. There is no guarding or rebound.  Musculoskeletal:        General: No swelling.     Cervical back: Neck supple.  Skin:    General: Skin is warm and dry.     Capillary Refill: Capillary refill takes less than 2 seconds.  Neurological:     General: No focal deficit present.     Mental Status: She is alert.     Gait: Gait normal.     ED Results / Procedures / Treatments   Labs (all labs ordered are listed, but only abnormal results are displayed) Labs Reviewed  COMPREHENSIVE METABOLIC PANEL - Abnormal; Notable for the following components:      Result Value   Creatinine, Ser 1.29 (*)    GFR, Estimated 50 (*)    All other components within normal limits  URINALYSIS, ROUTINE W REFLEX MICROSCOPIC - Abnormal; Notable for the following components:   Hgb urine dipstick MODERATE (*)    Protein, ur TRACE (*)    Leukocytes,Ua LARGE (*)    Bacteria, UA RARE (*)    All other components within  normal limits  LIPASE, BLOOD  CBC    EKG EKG Interpretation Date/Time:  Friday April 05 2023 18:01:17 EDT Ventricular Rate:  66 PR Interval:    QRS Duration:  72 QT Interval:  378 QTC Calculation: 396 R Axis:   36  Text Interpretation: sinus rhythm with poor baseline Nonspecific ST abnormality Abnormal ECG When compared with ECG of 26-Oct-2015 13:37, No significant change since last tracing Confirmed by Meridee Score [phone]) on 04/05/2023 6:04:07 PM  Radiology CT ABDOMEN PELVIS W CONTRAST  Result Date: 04/05/2023 CLINICAL  DATA:  Abdominal pain, acute, nonlocalized constipation EXAM: CT ABDOMEN AND PELVIS WITH CONTRAST TECHNIQUE: Multidetector CT imaging of the abdomen and pelvis was performed using the standard protocol following bolus administration of intravenous contrast. RADIATION DOSE REDUCTION: This exam was performed according to the departmental dose-optimization program which includes automated exposure control, adjustment of the mA and/or kV according to patient size and/or use of iterative reconstruction technique. CONTRAST:  80mL OMNIPAQUE IOHEXOL 300 MG/ML  SOLN COMPARISON:  04/21/2020 FINDINGS: Lower chest: No acute abnormality Hepatobiliary: No focal hepatic abnormality. Gallbladder unremarkable. Pancreas: No focal abnormality or ductal dilatation. Spleen: No focal abnormality.  Normal size. Adrenals/Urinary Tract: Insert crash that adrenal glands normal. Moderate right hydronephrosis due to 4 mm proximal to mid right ureteral stone. No stones or hydronephrosis on the right. Urinary bladder unremarkable. Stomach/Bowel: Stomach, large and small bowel grossly unremarkable. Vascular/Lymphatic: No evidence of aneurysm or adenopathy. Reproductive: Prior hysterectomy.  No adnexal masses. Other: No free fluid or free air. Musculoskeletal: No acute bony abnormality. IMPRESSION: 4 mm proximal to mid left ureteral stone with moderate left hydronephrosis. Electronically Signed   By:  Charlett Nose M.D.   On: 04/05/2023 21:17    Procedures Procedures    Medications Ordered in ED Medications  iohexol (OMNIPAQUE) 300 MG/ML solution 100 mL (80 mLs Intravenous Contrast Given 04/05/23 1904)  cefTRIAXone (ROCEPHIN) 1 g in sodium chloride 0.9 % 100 mL IVPB (0 g Intravenous Stopped 04/05/23 2214)    ED Course/ Medical Decision Making/ A&P Clinical Course as of 04/06/23 1027  Fri Apr 05, 2023  2129 Reviewed with patient.  She says she is having back pain but she thought it was related more to the constipation.  She said she has had kidney stone pain in her pain now is not near as severe as when she had kidney stones.  Her urine did show red cells and white cells although rare bacteria.  Will give her an IV dose antibiotics. [MB]  2143 Discussed with urology on-call Dr. Joyce Gross.  She agreed she did not feel this was indicative of a septic stone and she was fine for outpatient urology follow-up.  She took her name and number and will have the office set her up with an appointment. [MB]    Clinical Course User Index [MB] Terrilee Files, MD                                 Medical Decision Making Amount and/or Complexity of Data Reviewed Labs: ordered. Radiology: ordered.  Risk Prescription drug management.   This patient complains of constipation abdominal pain nausea vomiting back pain; this involves an extensive number of treatment Options and is a complaint that carries with it a high risk of complications and morbidity. The differential includes constipation, obstruction, diverticulitis, colitis, renal colic  I ordered, reviewed and interpreted labs, which included CBC normal chemistries with mild elevation of creatinine urinalysis possible infection I ordered medication IV antibiotics and reviewed PMP when indicated. I ordered imaging studies which included CT abdomen and pelvis and I independently    visualized and interpreted imaging which showed proximal left  ureteral stone with hydronephrosis Additional history obtained from patient's sister Previous records obtained and reviewed in epic including recent PCP notes I consulted urology Dr. Joyce Gross and discussed lab and imaging findings and discussed disposition.  Cardiac monitoring reviewed, sinus rhythm Social determinants considered, no significant barriers Critical Interventions: None  After  the interventions stated above, I reevaluated the patient and found patient to be very well-appearing in no distress Admission and further testing considered, will cover with antibiotics and Flomax.  She said her pain is controlled with ibuprofen.  Gave her instructions for further management of chronic constipation.  Recommended close follow-up with PCP and urology.  Return instructions discussed         Final Clinical Impression(s) / ED Diagnoses Final diagnoses:  Kidney stone on left side  Constipation, unspecified constipation type    Rx / DC Orders ED Discharge Orders          Ordered    cephALEXin (KEFLEX) 500 MG capsule  4 times daily        04/05/23 2206    tamsulosin (FLOMAX) 0.4 MG CAPS capsule  Daily        04/05/23 2206              Terrilee Files, MD 04/06/23 1030

## 2023-04-05 NOTE — Discharge Instructions (Addendum)
You were seen in the emergency department for back pain and constipation.  Your lab work was fairly unremarkable, your urine had some blood and white blood cells in it.  Your CAT scan showed a kidney stone on the left which possibly is a cause of some of your back pain.  Your bowels were unremarkable.  We are starting you on some antibiotics for possible urine infection and some medication to help pass the kidney stone.  You can continue to use ibuprofen.  Included below are some things to try as far as the constipation.  Please follow up with your primary care doctor within 2-3 days. For constipation we also recommend a diet high in fiber (beans, fruits, vegetables, whole grains). Take Colace 100-200 mg up to three times per day. You may take along with Senokot 1-2 tabs, ingest with full glass of water.  You may also take MiraLAX 1-2 capfuls 1-2 times a day until stools become soft and then slowly decrease the amount of MiraLAX used.  Maintain fluid intake 6-8 glasses per day. Please increase fibers in your diet. You may also take Milk of Magnesia 30 mL as needed for constipation, you may repeat in 2 hours again if no bowl movement.

## 2023-04-08 ENCOUNTER — Other Ambulatory Visit: Payer: Self-pay | Admitting: Family Medicine

## 2023-04-08 MED ORDER — HYDROCODONE-ACETAMINOPHEN 5-325 MG PO TABS: 1.0000 | ORAL_TABLET | Freq: Four times a day (QID) | ORAL | 0 refills | Status: DC | PRN

## 2023-04-09 ENCOUNTER — Other Ambulatory Visit: Payer: Self-pay | Admitting: Family Medicine

## 2023-04-09 MED ORDER — CLENPIQ 10-3.5-12 MG-GM -GM/160ML PO SOLN: 160.0000 mL | Freq: Once | ORAL | 0 refills | Status: AC

## 2023-04-11 NOTE — Telephone Encounter (Addendum)
Pharmacy requesting refills of the following:   HYDROcodone-acetaminophen (NORCO) 5-325 MG tablet [[id number]]   ALPRAZolam (XANAX) 1 MG tablet [[id number]]   Insulin Disposable Pump (OMNIPOD DASH PODS, GEN 4,) MISC [[id number]]    Sod Picosulfate-Mag Ox-Cit Acd (CLENPIQ) 10-3.5-12 MG-GM Marty Heck [[id number]]  ENDED    Pharmacy:  [id number] [place]en, [place] - [address] [place]nn M[place] [address] Phone: [phone] Fax: [phone]  Please advise pharmacist.

## 2023-05-06 ENCOUNTER — Other Ambulatory Visit: Payer: Self-pay | Admitting: Family Medicine

## 2023-05-06 MED ORDER — ALPRAZOLAM 1 MG PO TABS: 1.0000 mg | ORAL_TABLET | Freq: Three times a day (TID) | ORAL | 2 refills | Status: DC | PRN

## 2023-05-07 NOTE — Telephone Encounter (Signed)
Requested medication (s) are due for refill today - no  Requested medication (s) are on the active medication list -yes  Future visit scheduled -no  Last refill: 05/06/23 #90 2RF  Notes to clinic: duplicate request- non delegated Rx  Requested Prescriptions  Pending Prescriptions Disp Refills   ALPRAZolam (XANAX) 1 MG tablet [Pharmacy Med Name: ALPRAZOLAM 1 MG TABLET] 90 tablet 0    Sig: TAKE 1 TABLET BY MOUTH 3 TIMES DAILY AS NEEDED.     Not Delegated - Psychiatry: Anxiolytics/Hypnotics 2 Failed - 05/06/2023  3:18 PM      Failed - This refill cannot be delegated      Failed - Urine Drug Screen completed in last 360 days      Failed - Valid encounter within last 6 months    Recent Outpatient Visits           1 year ago Type 2 diabetes mellitus with other specified complication, with long-term current use of insulin (HCC)   [facility] Medicine Pickard, Priscille Heidelberg, MD   2 years ago Morbid obesity [facility])   [facility] Medicine Pickard, Priscille Heidelberg, MD   2 years ago Fatigue, unspecified type   [facility] Medicine Donita Brooks, MD   2 years ago Acute non-recurrent maxillary sinusitis   [facility] Medicine Valentino Nose, NP   3 years ago Gastroparesis   [facility] Medicine Pickard, Priscille Heidelberg, MD              Passed - Patient is not pregnant         Requested Prescriptions  Pending Prescriptions Disp Refills   ALPRAZolam (XANAX) 1 MG tablet [Pharmacy Med Name: ALPRAZOLAM 1 MG TABLET] 90 tablet 0    Sig: TAKE 1 TABLET BY MOUTH 3 TIMES DAILY AS NEEDED.     Not Delegated - Psychiatry: Anxiolytics/Hypnotics 2 Failed - 05/06/2023  3:18 PM      Failed - This refill cannot be delegated      Failed - Urine Drug Screen completed in last 360 days      Failed - Valid encounter within last 6 months    Recent Outpatient Visits           1 year ago Type 2 diabetes mellitus with other specified complication, with long-term  current use of insulin (HCC)   [facility] Medicine Pickard, Priscille Heidelberg, MD   2 years ago Morbid obesity [facility])   [facility] Medicine Donita Brooks, MD   2 years ago Fatigue, unspecified type   [facility] Donita Brooks, MD   2 years ago Acute non-recurrent maxillary sinusitis   [facility] Medicine Valentino Nose, NP   3 years ago Gastroparesis   [facility] Family Medicine Pickard, Priscille Heidelberg, MD              Passed - Patient is not pregnant

## 2023-06-02 ENCOUNTER — Other Ambulatory Visit: Payer: Self-pay | Admitting: Family Medicine

## 2023-06-02 DIAGNOSIS — E1169 Type 2 diabetes mellitus with other specified complication: Secondary | ICD-10-CM

## 2023-06-03 ENCOUNTER — Encounter: Payer: Self-pay | Admitting: Family Medicine

## 2023-06-03 ENCOUNTER — Other Ambulatory Visit: Payer: Self-pay

## 2023-06-03 DIAGNOSIS — E1169 Type 2 diabetes mellitus with other specified complication: Secondary | ICD-10-CM

## 2023-06-03 MED ORDER — OMNIPOD DASH PODS (GEN 4) MISC: 1.0000 | 3 refills | Status: DC

## 2023-06-27 ENCOUNTER — Other Ambulatory Visit: Payer: Self-pay

## 2023-06-27 ENCOUNTER — Telehealth: Payer: Self-pay | Admitting: Family Medicine

## 2023-06-27 DIAGNOSIS — I10 Essential (primary) hypertension: Secondary | ICD-10-CM

## 2023-06-27 MED ORDER — ACETAZOLAMIDE 250 MG PO TABS: 250.0000 mg | ORAL_TABLET | Freq: Two times a day (BID) | ORAL | 3 refills | Status: DC

## 2023-06-27 NOTE — Telephone Encounter (Signed)
 Prescription Request  06/27/2023  LOV: 03/29/2023  What is the name of the medication or equipment? acetaZOLAMIDE  (DIAMOX ) 250 MG tablet   Have you contacted your pharmacy to request a refill? Yes   Which pharmacy would you like this sent to?  [id number] [place] [place], [place] - [address] [place] [address] Phone: [phone] Fax: [phone]    Patient notified that their request is being sent to the clinical staff for review and that they should receive a response within 2 business days.   Please advise at [facility] [phone]

## 2023-07-30 ENCOUNTER — Other Ambulatory Visit: Payer: Self-pay | Admitting: Family Medicine

## 2023-07-30 NOTE — Telephone Encounter (Signed)
 Requested medication (s) are due for refill today: Yes  Requested medication (s) are on the active medication list: Yes  Last refill:  05/06/23, #90, 2 refills  Future visit scheduled: No  Notes to clinic:  Unable to refill per protocol, cannot delegate.      Requested Prescriptions  Pending Prescriptions Disp Refills   ALPRAZolam  (XANAX ) 1 MG tablet [Pharmacy Med Name: ALPRAZOLAM  1 MG TABLET] 90 tablet 2    Sig: TAKE 1 TABLET BY MOUTH 3 TIMES DAILY AS NEEDED.     Not Delegated - Psychiatry: Anxiolytics/Hypnotics 2 Failed - 07/30/2023  3:04 PM      Failed - This refill cannot be delegated      Failed - Urine Drug Screen completed in last 360 days      Failed - Valid encounter within last 6 months    Recent Outpatient Visits           1 year ago Type 2 diabetes mellitus with other specified complication, with long-term current use of insulin  (HCC)   [facility] Medicine Pickard, Butler DASEN, MD   2 years ago Morbid obesity [facility])   [facility] Medicine Duanne Butler DASEN, MD   2 years ago Fatigue, unspecified type   [facility] Medicine Duanne Butler DASEN, MD   2 years ago Acute non-recurrent maxillary sinusitis   [facility] Medicine Chandra Harlene LABOR, NP   3 years ago Gastroparesis   [facility] Family Medicine Pickard, Butler DASEN, MD              Passed - Patient is not pregnant

## 2023-09-18 ENCOUNTER — Encounter: Payer: Self-pay | Admitting: Family Medicine

## 2023-09-18 ENCOUNTER — Other Ambulatory Visit: Payer: Self-pay

## 2023-09-18 DIAGNOSIS — Z794 Long term (current) use of insulin: Secondary | ICD-10-CM

## 2023-09-18 MED ORDER — OMNIPOD DASH PODS (GEN 4) MISC: 1.0000 | 3 refills | Status: DC

## 2023-09-22 ENCOUNTER — Other Ambulatory Visit: Payer: Self-pay | Admitting: Family Medicine

## 2023-09-22 DIAGNOSIS — I10 Essential (primary) hypertension: Secondary | ICD-10-CM

## 2023-09-24 NOTE — Telephone Encounter (Signed)
 Requested Prescriptions  Pending Prescriptions Disp Refills   acetaZOLAMIDE (DIAMOX) 250 MG tablet [Pharmacy Med Name: ACETAZOLAMIDE 250 MG TABLET] 180 tablet 0    Sig: TAKE 1 TABLET BY MOUTH TWICE A DAY     Ophthalmology: Glaucoma - acetazolamide Passed - 09/24/2023  2:50 PM      Passed - HCT in normal range and within 360 days    HCT  Date Value Ref Range Status  04/05/2023 40.6 36.0 - 46.0 % Final         Passed - HGB in normal range and within 360 days    Hemoglobin  Date Value Ref Range Status  04/05/2023 13.4 12.0 - 15.0 g/dL Final         Passed - K in normal range and within 360 days    Potassium  Date Value Ref Range Status  04/05/2023 3.7 3.5 - 5.1 mmol/L Final         Passed - Na in normal range and within 360 days    Sodium  Date Value Ref Range Status  04/05/2023 141 135 - 145 mmol/L Final         Passed - PLT in normal range and within 360 days    Platelets  Date Value Ref Range Status  04/05/2023 165 150 - 400 K/uL Final         Passed - WBC in normal range and within 360 days    WBC  Date Value Ref Range Status  04/05/2023 6.9 4.0 - 10.5 K/uL Final         Passed - Valid encounter within last 12 months    Recent Outpatient Visits           5 months ago Chronic idiopathic constipation   [facility] Medicine Donita Brooks, MD   11 months ago Type 2 diabetes mellitus with other specified complication, with long-term current use of insulin [facility])   [facility] Donita Brooks, MD   1 year ago Type 2 diabetes mellitus with other specified complication, with long-term current use of insulin [facility])   [employer] [facility] Medicine Pickard, Priscille Heidelberg, MD

## 2023-10-10 ENCOUNTER — Ambulatory Visit: Payer: Self-pay

## 2023-10-10 NOTE — Telephone Encounter (Signed)
 Copied from CRM [id number]. Topic: Clinical - Red Word Triage >> Oct 10, 2023  8:10 AM Tiffany B wrote: Red Word that prompted transfer to Nurse Triage: Patient states she's in severe left side rib pain. Possible shingles.   Chief Complaint: Painful rash left ribs."Can't I just make an appointment?" Symptoms: Above Frequency: 2 days Pertinent Negatives: Patient denies fever Disposition: [] ED /[x] Urgent Care (no appt availability in office) / [] Appointment(In office/virtual)/ []  [facility]/ [] Home Care/ [] Refused Recommended Disposition /[] [facility]/ []  Follow-up with PCP Additional Notes: No availability  Reason for Disposition  [1] Rash is painful to touch AND [2] fever  Answer Assessment - Initial Assessment Questions 1. APPEARANCE of RASH: "Describe the rash."      Red 2. LOCATION: "Where is the rash located?"      Left ribs 3. NUMBER: "How many spots are there?"      unsure 4. SIZE: "How big are the spots?" (Inches, centimeters or compare to size of a coin)      small 5. ONSET: "When did the rash start?"      2 days 6. ITCHING: "Does the rash itch?" If Yes, ask: "How bad is the itch?"  (Scale 0-10; or none, mild, moderate, severe)     No 7. PAIN: "Does the rash hurt?" If Yes, ask: "How bad is the pain?"  (Scale 0-10; or none, mild, moderate, severe)    - NONE (0): no pain    - MILD (1-3): doesn't interfere with normal activities     - MODERATE (4-7): interferes with normal activities or awakens from sleep     - SEVERE (8-10): excruciating pain, unable to do any normal activities     Yes 8. OTHER SYMPTOMS: "Do you have any other symptoms?" (e.g., fever)     No 9. PREGNANCY: "Is there any chance you are pregnant?" "When was your last menstrual period?"     No  Protocols used: Rash or Redness - Localized-A-AH

## 2023-10-16 ENCOUNTER — Other Ambulatory Visit (HOSPITAL_COMMUNITY): Payer: Self-pay | Admitting: Gastroenterology

## 2023-10-16 DIAGNOSIS — R1084 Generalized abdominal pain: Secondary | ICD-10-CM

## 2023-10-21 ENCOUNTER — Encounter: Payer: Self-pay | Admitting: Family Medicine

## 2023-10-21 ENCOUNTER — Other Ambulatory Visit: Payer: Self-pay

## 2023-10-21 ENCOUNTER — Other Ambulatory Visit: Payer: Self-pay | Admitting: Family Medicine

## 2023-10-21 ENCOUNTER — Telehealth: Payer: Self-pay | Admitting: Family Medicine

## 2023-10-21 MED ORDER — ALPRAZOLAM 1 MG PO TABS: 1.0000 mg | ORAL_TABLET | Freq: Three times a day (TID) | ORAL | 2 refills | Status: DC | PRN

## 2023-10-21 MED ORDER — AZITHROMYCIN 250 MG PO TABS: ORAL_TABLET | ORAL | 0 refills | Status: DC

## 2023-10-21 NOTE — Telephone Encounter (Signed)
 Copied from CRM [id number]. Topic: Appointments - Appointment Scheduling >> Oct 21, 2023  1:25 PM Phil Braun wrote: Pt scheduled appt for May 5th, she is request enough of the medication, ALPRAZolam  (XANAX ) 1 MG tablet until she can get in to see him.

## 2023-10-24 ENCOUNTER — Encounter (HOSPITAL_COMMUNITY): Payer: Self-pay

## 2023-10-24 ENCOUNTER — Ambulatory Visit (HOSPITAL_COMMUNITY)

## 2023-10-28 ENCOUNTER — Encounter: Payer: Self-pay | Admitting: Family Medicine

## 2023-10-28 ENCOUNTER — Ambulatory Visit (INDEPENDENT_AMBULATORY_CARE_PROVIDER_SITE_OTHER): Admitting: Family Medicine

## 2023-10-28 VITALS — BP 120/62 | HR 83 | Temp 98.4°F | Ht 61.0 in | Wt 208.0 lb

## 2023-10-28 DIAGNOSIS — E1169 Type 2 diabetes mellitus with other specified complication: Secondary | ICD-10-CM

## 2023-10-28 DIAGNOSIS — Z794 Long term (current) use of insulin: Secondary | ICD-10-CM | POA: Diagnosis not present

## 2023-10-28 DIAGNOSIS — K5904 Chronic idiopathic constipation: Secondary | ICD-10-CM

## 2023-10-28 MED ORDER — PRUCALOPRIDE SUCCINATE 2 MG PO TABS: 2.0000 mg | ORAL_TABLET | Freq: Every day | ORAL | 3 refills | Status: DC

## 2023-10-28 MED ORDER — METFORMIN HCL 500 MG PO TABS: 1000.0000 mg | ORAL_TABLET | Freq: Two times a day (BID) | ORAL | 3 refills | Status: DC

## 2023-10-28 NOTE — Progress Notes (Signed)
 Subjective:    Patient ID: Kimberly [name], female    DOB: 1971-02-13, 53 y.o.   MRN: [id number]  Patient has a history of chronic idiopathic constipation.  She has been referred to GI.  She states that nothing is helping.  She is currently on Linzess, MiraLAX  twice a day, Senokot, and Dulcolax without any benefit.  She quit Ozempic  several months ago.  She has not been checking blood sugars.  She feels that her blood sugars are out of control.  She states that she has not had a bowel movement in 21 days despite taking all of these laxatives and stool softeners.  She denies any fevers or chills or nausea or vomiting.  She is under tremendous stress.  Her daughter was recently arrested for drug possession with intent to distribute.  The patient is using Xanax  once a day but feels that this is not helpful.  She feels like she needs to take something 3 or 4 times a day just to calm her nerves. Past Medical History:  Diagnosis Date   Abnormal vaginal Pap smear    tx with cryotherapy   Anxiety    Depression    Diabetes mellitus without complication (HCC)    History of kidney stones    passed stone - no surgery   Hypertension    Migraine    otc med prn   Obesity    OSA (obstructive sleep apnea)    severe- RDI 43   Pseudotumor cerebri    SVD (spontaneous vaginal delivery)    x 3   Tension headache    Past Surgical History:  Procedure Laterality Date   ABDOMINAL HYSTERECTOMY     ANTERIOR CERVICAL DECOMP/DISCECTOMY FUSION N/A 01/03/2016   Procedure: ANTERIOR CERVICAL DECOMPRESSION FUSION CERVICAL FIVE-SIX,CERVICAL SIX-SEVEN.;  Surgeon: Augusto Blonder, MD;  Location: [facility];  Service: Neurosurgery;  Laterality: N/A;  right side approach   CRYOTHERAPY     LAPAROSCOPIC ASSISTED VAGINAL HYSTERECTOMY Bilateral 04/21/2014   Procedure: LAPAROSCOPIC ASSISTED VAGINAL HYSTERECTOMY, BILATERAL SALPINGECTOMY;  Surgeon: Cyd Dowse, MD;  Location: [facility];  Service: Gynecology;   Laterality: Bilateral;   MANDIBLE FRACTURE SURGERY  2002   x 1   TUBAL LIGATION  11/2003   WISDOM TOOTH EXTRACTION     Current Outpatient Medications on File Prior to Visit  Medication Sig Dispense Refill   acetaZOLAMIDE  (DIAMOX ) 250 MG tablet TAKE 1 TABLET BY MOUTH TWICE A DAY 180 tablet 0   ALPRAZolam  (XANAX ) 1 MG tablet Take 1 tablet (1 mg total) by mouth 3 (three) times daily as needed. 90 tablet 2   atenolol  (TENORMIN ) 100 MG tablet Take 1 tablet (100 mg total) by mouth daily. 90 tablet 3   Blood Glucose Monitoring Suppl (ACCU-CHEK GUIDE ME) w/Device KIT 1 each by Does not apply route daily. 1 kit 0   buPROPion  (WELLBUTRIN  XL) 300 MG 24 hr tablet Take 1 tablet (300 mg total) by mouth daily. 90 tablet 1   Continuous Blood Gluc Sensor (DEXCOM G5 MOB/G4 PLAT SENSOR) MISC USE TO CHECK BLOOD SUGARS UP TO 6 X PER DAY FOR INSULIN  DEPENDANT DIABETIC. 8 each 3   estradiol (VIVELLE-DOT) 0.075 MG/24HR Place onto the skin.     fluticasone  (FLONASE ) 50 MCG/ACT nasal spray SPRAY 2 SPRAYS INTO EACH NOSTRIL EVERY DAY 48 mL 2   glucose blood (ACCU-CHEK GUIDE) test strip USE AS INSTRUCTED TO TEST BLOOD SUGAR 5 TIMES DAILY. Dx: E11.65 100 strip 12   HYDROcodone -acetaminophen  (NORCO) 5-325 MG tablet  Take 1 tablet by mouth every 6 (six) hours as needed for moderate pain (pain score 4-6). 30 tablet 0   Lancets Misc. (ACCU-CHEK FASTCLIX LANCET) KIT 1 each by Does not apply route daily. Use to check blood sugar 5 times daily. 1 kit 12   loratadine  (CLARITIN ) 10 MG tablet Take 1 tablet (10 mg total) by mouth daily. 90 tablet 1   losartan  (COZAAR ) 50 MG tablet Take 1 tablet (50 mg total) by mouth daily. 90 tablet 1   rosuvastatin  (CRESTOR ) 10 MG tablet Take 1 tablet (10 mg total) by mouth daily. 90 tablet 1   Semaglutide , 2 MG/DOSE, 8 MG/3ML SOPN Inject 2 mg as directed once a week. 9 mL 1   tamsulosin  (FLOMAX ) 0.4 MG CAPS capsule Take 1 capsule (0.4 mg total) by mouth daily. 30 capsule 0   Vilazodone HCl  (VIIBRYD) 40 MG TABS Take by mouth daily.     Continuous Blood Gluc Receiver (DEXCOM G5 MOBILE RECEIVER) DEVI USE AS DIRECTED TO CHECK BLOOD SUGAR UP TO 6 TIMES PER DAY. (Patient not taking: Reported on 10/28/2023) 1 each 3   No current facility-administered medications on file prior to visit.       No Known Allergies Social History   Socioeconomic History   Marital status: Divorced    Spouse name: Not on file   Number of children: Not on file   Years of education: Not on file   Highest education level: Not on file  Occupational History   Not on file  Tobacco Use   Smoking status: Never   Smokeless tobacco: Never  Substance and Sexual Activity   Alcohol use: Yes    Alcohol/week: 1.0 standard drink of alcohol    Types: 1 Glasses of wine per week    Comment: one glass of wine per week   Drug use: No   Sexual activity: Yes    Birth control/protection: Surgical  Other Topics Concern   Not on file  Social History Narrative   Not on file   Social Drivers of Health   Financial Resource Strain: Not on file  Food Insecurity: Not on file  Transportation Needs: Not on file  Physical Activity: Not on file  Stress: Not on file  Social Connections: Not on file  Intimate Partner Violence: Not on file     Review of Systems  All other systems reviewed and are negative.      Objective:   Physical Exam Vitals reviewed.  Constitutional:      General: She is not in acute distress.    Appearance: She is well-developed. She is not diaphoretic.  Cardiovascular:     Rate and Rhythm: Normal rate and regular rhythm.     Heart sounds: Normal heart sounds.  Pulmonary:     Effort: Pulmonary effort is normal. No respiratory distress.     Breath sounds: Normal breath sounds. No wheezing or rales.  Abdominal:     General: Bowel sounds are normal. There is no distension.     Palpations: Abdomen is soft.     Tenderness: There is no abdominal tenderness. There is no guarding or rebound.   Lymphadenopathy:     Cervical: No cervical adenopathy.           Assessment & Plan:  Chronic idiopathic constipation  Type 2 diabetes mellitus with other specified complication, with long-term current use of insulin  (HCC) - Plan: CBC with Differential/Platelet, COMPLETE METABOLIC PANEL WITHOUT GFR, Hemoglobin A1c, Microalbumin/Creatinine Ratio, Urine Patient has  tried and failed numerous stool softeners and laxatives.  I have recommended starting metformin  1000 mg twice daily.  She had to stop this medication in the past due to severe diarrhea.  Perhaps we can use this to benefit to help manage her chronic idiopathic constipation.  If this is not official I would try Motegrity 2 mg daily.  She has seen the gastroenterologist.  She is also working with them.  Meanwhile I will check a CBC a CMP and an A1c and a urine protein creatinine ratio to assess management of her diabetes.  Recommended she discuss with her psychiatrist for anxiety but I suggested starting 7.5 mg twice daily to avoid increasing her Xanax  dependency

## 2023-10-29 ENCOUNTER — Other Ambulatory Visit: Payer: Self-pay

## 2023-10-29 ENCOUNTER — Other Ambulatory Visit: Payer: Self-pay | Admitting: Family Medicine

## 2023-10-29 LAB — COMPLETE METABOLIC PANEL WITHOUT GFR
AG Ratio: 1.6 (calc) (ref 1.0–2.5)
ALT: 18 U/L (ref 6–29)
AST: 18 U/L (ref 10–35)
Albumin: 4.1 g/dL (ref 3.6–5.1)
Alkaline phosphatase (APISO): 89 U/L (ref 37–153)
BUN/Creatinine Ratio: 13 (calc) (ref 6–22)
BUN: 21 mg/dL (ref 7–25)
CO2: 24 mmol/L (ref 20–32)
Calcium: 9 mg/dL (ref 8.6–10.4)
Chloride: 110 mmol/L (ref 98–110)
Creat: 1.57 mg/dL — ABNORMAL HIGH (ref 0.50–1.03)
Globulin: 2.5 g/dL (ref 1.9–3.7)
Glucose, Bld: 158 mg/dL — ABNORMAL HIGH (ref 65–99)
Potassium: 3.7 mmol/L (ref 3.5–5.3)
Sodium: 143 mmol/L (ref 135–146)
Total Bilirubin: 0.4 mg/dL (ref 0.2–1.2)
Total Protein: 6.6 g/dL (ref 6.1–8.1)

## 2023-10-29 LAB — CBC WITH DIFFERENTIAL/PLATELET
Absolute Lymphocytes: 2214 {cells}/uL (ref 850–3900)
Absolute Monocytes: 675 {cells}/uL (ref 200–950)
Basophils Absolute: 38 {cells}/uL (ref 0–200)
Basophils Relative: 0.4 %
Eosinophils Absolute: 124 {cells}/uL (ref 15–500)
Eosinophils Relative: 1.3 %
HCT: 42 % (ref 35.0–45.0)
Hemoglobin: 13.2 g/dL (ref 11.7–15.5)
MCH: 28 pg (ref 27.0–33.0)
MCHC: 31.4 g/dL — ABNORMAL LOW (ref 32.0–36.0)
MCV: 89 fL (ref 80.0–100.0)
MPV: 11.6 fL (ref 7.5–12.5)
Monocytes Relative: 7.1 %
Neutro Abs: 6451 {cells}/uL (ref 1500–7800)
Neutrophils Relative %: 67.9 %
Platelets: 179 10*3/uL (ref 140–400)
RBC: 4.72 10*6/uL (ref 3.80–5.10)
RDW: 12.6 % (ref 11.0–15.0)
Total Lymphocyte: 23.3 %
WBC: 9.5 10*3/uL (ref 3.8–10.8)

## 2023-10-29 LAB — HEMOGLOBIN A1C
Hgb A1c MFr Bld: 5.7 % — ABNORMAL HIGH (ref <5.7)
Mean Plasma Glucose: 117 mg/dL
eAG (mmol/L): 6.5 mmol/L

## 2023-10-29 LAB — MICROALBUMIN / CREATININE URINE RATIO
Creatinine, Urine: 165 mg/dL (ref 20–275)
Microalb Creat Ratio: 12 mg/g{creat} (ref <30)
Microalb, Ur: 1.9 mg/dL

## 2023-10-30 ENCOUNTER — Ambulatory Visit (HOSPITAL_BASED_OUTPATIENT_CLINIC_OR_DEPARTMENT_OTHER)
Admission: RE | Admit: 2023-10-30 | Discharge: 2023-10-30 | Disposition: A | Discharge status: Home / Self Care | Source: Ambulatory Visit | Attending: Gastroenterology | Admitting: Gastroenterology

## 2023-10-30 DIAGNOSIS — R1084 Generalized abdominal pain: Secondary | ICD-10-CM | POA: Insufficient documentation

## 2023-10-30 MED ORDER — IOHEXOL 300 MG/ML  SOLN
100.0000 mL | Freq: Once | INTRAMUSCULAR | Status: AC | PRN
  Administered 2023-10-30: 100 mL via INTRAVENOUS

## 2023-10-31 ENCOUNTER — Emergency Department (HOSPITAL_COMMUNITY)
Admission: EM | Admit: 2023-10-31 | Discharge: 2023-10-31 | Disposition: A | Discharge status: Home / Self Care | Attending: Emergency Medicine | Admitting: Emergency Medicine

## 2023-10-31 ENCOUNTER — Other Ambulatory Visit: Payer: Self-pay

## 2023-10-31 DIAGNOSIS — E119 Type 2 diabetes mellitus without complications: Secondary | ICD-10-CM | POA: Diagnosis not present

## 2023-10-31 DIAGNOSIS — I10 Essential (primary) hypertension: Secondary | ICD-10-CM | POA: Diagnosis not present

## 2023-10-31 DIAGNOSIS — R1032 Left lower quadrant pain: Secondary | ICD-10-CM | POA: Diagnosis present

## 2023-10-31 DIAGNOSIS — K5900 Constipation, unspecified: Secondary | ICD-10-CM | POA: Insufficient documentation

## 2023-10-31 DIAGNOSIS — Z79899 Other long term (current) drug therapy: Secondary | ICD-10-CM | POA: Insufficient documentation

## 2023-10-31 DIAGNOSIS — R7989 Other specified abnormal findings of blood chemistry: Secondary | ICD-10-CM | POA: Diagnosis not present

## 2023-10-31 DIAGNOSIS — N132 Hydronephrosis with renal and ureteral calculous obstruction: Secondary | ICD-10-CM | POA: Diagnosis not present

## 2023-10-31 DIAGNOSIS — Z7984 Long term (current) use of oral hypoglycemic drugs: Secondary | ICD-10-CM | POA: Diagnosis not present

## 2023-10-31 DIAGNOSIS — N201 Calculus of ureter: Secondary | ICD-10-CM

## 2023-10-31 DIAGNOSIS — Z794 Long term (current) use of insulin: Secondary | ICD-10-CM | POA: Insufficient documentation

## 2023-10-31 DIAGNOSIS — N2889 Other specified disorders of kidney and ureter: Secondary | ICD-10-CM

## 2023-10-31 DIAGNOSIS — N200 Calculus of kidney: Secondary | ICD-10-CM

## 2023-10-31 LAB — CBC WITH DIFFERENTIAL/PLATELET
Abs Immature Granulocytes: 0.02 10*3/uL (ref 0.00–0.07)
Basophils Absolute: 0 10*3/uL (ref 0.0–0.1)
Basophils Relative: 0 %
Eosinophils Absolute: 0.2 10*3/uL (ref 0.0–0.5)
Eosinophils Relative: 2 %
HCT: 40.4 % (ref 36.0–46.0)
Hemoglobin: 12.4 g/dL (ref 12.0–15.0)
Immature Granulocytes: 0 %
Lymphocytes Relative: 28 %
Lymphs Abs: 2 10*3/uL (ref 0.7–4.0)
MCH: 28.3 pg (ref 26.0–34.0)
MCHC: 30.7 g/dL (ref 30.0–36.0)
MCV: 92.2 fL (ref 80.0–100.0)
Monocytes Absolute: 0.5 10*3/uL (ref 0.1–1.0)
Monocytes Relative: 7 %
Neutro Abs: 4.4 10*3/uL (ref 1.7–7.7)
Neutrophils Relative %: 63 %
Platelets: 155 10*3/uL (ref 150–400)
RBC: 4.38 MIL/uL (ref 3.87–5.11)
RDW: 13.6 % (ref 11.5–15.5)
WBC: 7.1 10*3/uL (ref 4.0–10.5)
nRBC: 0 % (ref 0.0–0.2)

## 2023-10-31 LAB — URINALYSIS, ROUTINE W REFLEX MICROSCOPIC
Bilirubin Urine: NEGATIVE
Glucose, UA: NEGATIVE mg/dL
Hgb urine dipstick: NEGATIVE
Ketones, ur: NEGATIVE mg/dL
Nitrite: NEGATIVE
Protein, ur: NEGATIVE mg/dL
Specific Gravity, Urine: 1.017 (ref 1.005–1.030)
pH: 6 (ref 5.0–8.0)

## 2023-10-31 LAB — COMPREHENSIVE METABOLIC PANEL WITH GFR
ALT: 22 U/L (ref 0–44)
AST: 16 U/L (ref 15–41)
Albumin: 3.5 g/dL (ref 3.5–5.0)
Alkaline Phosphatase: 72 U/L (ref 38–126)
Anion gap: 7 (ref 5–15)
BUN: 22 mg/dL — ABNORMAL HIGH (ref 6–20)
CO2: 21 mmol/L — ABNORMAL LOW (ref 22–32)
Calcium: 8.7 mg/dL — ABNORMAL LOW (ref 8.9–10.3)
Chloride: 113 mmol/L — ABNORMAL HIGH (ref 98–111)
Creatinine, Ser: 1.28 mg/dL — ABNORMAL HIGH (ref 0.44–1.00)
GFR, Estimated: 50 mL/min — ABNORMAL LOW (ref >60)
Glucose, Bld: 129 mg/dL — ABNORMAL HIGH (ref 70–99)
Potassium: 3.9 mmol/L (ref 3.5–5.1)
Sodium: 141 mmol/L (ref 135–145)
Total Bilirubin: 0.5 mg/dL (ref 0.0–1.2)
Total Protein: 6.3 g/dL — ABNORMAL LOW (ref 6.5–8.1)

## 2023-10-31 LAB — LIPASE, BLOOD: Lipase: 32 U/L (ref 11–51)

## 2023-10-31 MED ORDER — CEPHALEXIN 500 MG PO CAPS: 500.0000 mg | ORAL_CAPSULE | Freq: Two times a day (BID) | ORAL | 0 refills | Status: AC

## 2023-10-31 MED ORDER — ONDANSETRON 4 MG PO TBDP
4.0000 mg | ORAL_TABLET | Freq: Three times a day (TID) | ORAL | 0 refills | Status: AC | PRN
Start: 2023-10-31 — End: ?

## 2023-10-31 MED ORDER — SODIUM CHLORIDE 0.9 % IV SOLN
1.0000 g | Freq: Once | INTRAVENOUS | Status: AC
  Administered 2023-10-31: 1 g via INTRAVENOUS
  Filled 2023-10-31: qty 10

## 2023-10-31 MED ORDER — HYDROCODONE-ACETAMINOPHEN 5-325 MG PO TABS: 1.0000 | ORAL_TABLET | ORAL | 0 refills | Status: AC | PRN

## 2023-10-31 MED ORDER — ONDANSETRON 4 MG PO TBDP
4.0000 mg | ORAL_TABLET | Freq: Once | ORAL | Status: AC
  Administered 2023-10-31: 4 mg via ORAL
  Filled 2023-10-31: qty 1

## 2023-10-31 MED ORDER — KETOROLAC TROMETHAMINE 15 MG/ML IJ SOLN
15.0000 mg | Freq: Once | INTRAMUSCULAR | Status: AC
Start: 2023-10-31 — End: 2023-10-31
  Administered 2023-10-31: 15 mg via INTRAVENOUS
  Filled 2023-10-31: qty 1

## 2023-10-31 MED ORDER — TAMSULOSIN HCL 0.4 MG PO CAPS: 0.4000 mg | ORAL_CAPSULE | Freq: Every day | ORAL | 3 refills | Status: DC

## 2023-10-31 MED ORDER — SODIUM CHLORIDE 0.9 % IV BOLUS
500.0000 mL | Freq: Once | INTRAVENOUS | Status: AC
  Administered 2023-10-31: 500 mL via INTRAVENOUS

## 2023-10-31 MED ORDER — HYDROCODONE-ACETAMINOPHEN 5-325 MG PO TABS
1.0000 | ORAL_TABLET | Freq: Once | ORAL | Status: AC
  Administered 2023-10-31: 1 via ORAL
  Filled 2023-10-31: qty 1

## 2023-10-31 NOTE — ED Notes (Signed)
Patient verbalizes understanding of discharge instructions. Opportunity for questioning and answers were provided. Armband removed by staff, pt discharged from ED. Ambulated out to lobby  

## 2023-10-31 NOTE — Discharge Instructions (Addendum)
 Take Keflex  as prescribed. Take Norco as needed as prescribed for pain, do NOT drive if taking this medication. Take Zofran  as needed for nausea and vomiting as prescribed. Take Flomax  as previously prescribed.  Follow up with urology, please call in the morning to schedule an appointment.  Return to the ER for fevers, pain not controlled with pain medications or other concerning symptoms.  Pain medications can make your constipation worse, increase Miralax  as discussed.

## 2023-10-31 NOTE — Telephone Encounter (Signed)
 Requested medication (s) are due for refill today: see notes to clinic  Requested medication (s) are on the active medication list: yes   Last refill:  10/28/23 #30 3 refills  Future visit scheduled: no   Notes to clinic:  medication not assigned to a protocol. Pharmacy comment: Alternative Requested:PA.  Please advise      Requested Prescriptions  Pending Prescriptions Disp Refills   Prucalopride Succinate 2 MG TABS [Pharmacy Med Name: PRUCALOPRIDE 2 MG TABLET] 30 tablet 3    Sig: TAKE 1 TABLET BY MOUTH EVERY DAY     Off-Protocol Failed - 10/31/2023 10:27 AM      Failed - Medication not assigned to a protocol, review manually.      Passed - Valid encounter within last 12 months    Recent Outpatient Visits           3 days ago Chronic idiopathic constipation   [facility] Cheril Cork, Cisco Crest, MD   7 months ago Chronic idiopathic constipation   [facility] Austine Lefort, MD   1 year ago Type 2 diabetes mellitus with other specified complication, with long-term current use of insulin  [facility])   [place] [facility] Austine Lefort, MD   1 year ago Type 2 diabetes mellitus with other specified complication, with long-term current use of insulin  [facility])   [place] [facility] Pickard, Cisco Crest, MD

## 2023-10-31 NOTE — ED Provider Notes (Signed)
 Kimberly [name] Provider Note   CSN: [id number] Arrival date & time: 10/31/23  1501     History  Chief Complaint  Patient presents with   Nephrolithiasis    Kimberly [name] is a 53 y.o. female.  53 year old female presents with concern for kidney stone on outpatient CT. Patient has been having problems with constipation, seen by PCP and GI, had OP CT yesterday. Patient was called and told she has a kidney stone stuck in the ureter and to go to the ER. States her pain is LLQ, left lower back near her tailbone. Labs at PCP with elevated Cr, thought to be possibly related to her stone.  History of prior kidney stones, passed without intervention.  Given Vicodin in the ER with improvement in her pain.  Denies fevers, vomiting. Currently eating chips and soda, discussed NPO until plan made.        Home Medications Prior to Admission medications   Medication Sig Start Date End Date Taking? Authorizing Provider  cephALEXin  (KEFLEX ) 500 MG capsule Take 1 capsule (500 mg total) by mouth 2 (two) times daily for 5 days. 10/31/23 11/05/23 Yes Darlis Eisenmenger, PA-C  HYDROcodone -acetaminophen  (NORCO/VICODIN) 5-325 MG tablet Take 1 tablet by mouth every 4 (four) hours as needed. 10/31/23  Yes Darlis Eisenmenger, PA-C  ondansetron  (ZOFRAN -ODT) 4 MG disintegrating tablet Take 1 tablet (4 mg total) by mouth every 8 (eight) hours as needed for nausea or vomiting. 10/31/23  Yes Darlis Eisenmenger, PA-C  acetaZOLAMIDE  (DIAMOX ) 250 MG tablet TAKE 1 TABLET BY MOUTH TWICE A DAY 09/24/23   Austine Lefort, MD  ALPRAZolam  (XANAX ) 1 MG tablet Take 1 tablet (1 mg total) by mouth 3 (three) times daily as needed. 10/21/23   Austine Lefort, MD  atenolol  (TENORMIN ) 100 MG tablet Take 1 tablet (100 mg total) by mouth daily. 10/18/22   Austine Lefort, MD  Blood Glucose Monitoring Suppl (ACCU-CHEK GUIDE ME) w/Device KIT 1 each by Does not apply route daily. 10/10/17   Lajean Pike,  MD  buPROPion  (WELLBUTRIN  XL) 300 MG 24 hr tablet Take 1 tablet (300 mg total) by mouth daily. 10/19/22   Austine Lefort, MD  Continuous Blood Gluc Receiver (DEXCOM G5 MOBILE RECEIVER) DEVI USE AS DIRECTED TO CHECK BLOOD SUGAR UP TO 6 TIMES PER DAY. Patient not taking: Reported on 10/28/2023 03/30/22   Austine Lefort, MD  Continuous Blood Gluc Sensor (DEXCOM G5 MOB/G4 PLAT SENSOR) MISC USE TO CHECK BLOOD SUGARS UP TO 6 X PER DAY FOR INSULIN  DEPENDANT DIABETIC. 04/20/22   Austine Lefort, MD  estradiol (VIVELLE-DOT) 0.075 MG/24HR Place onto the skin. 06/22/21   [provider]  fluticasone  (FLONASE ) 50 MCG/ACT nasal spray SPRAY 2 SPRAYS INTO EACH NOSTRIL EVERY DAY 10/19/22   Austine Lefort, MD  glucose blood (ACCU-CHEK GUIDE) test strip USE AS INSTRUCTED TO TEST BLOOD SUGAR 5 TIMES DAILY. Dx: E11.65 05/11/22   Austine Lefort, MD  Lancets Misc. (ACCU-CHEK FASTCLIX LANCET) KIT 1 each by Does not apply route daily. Use to check blood sugar 5 times daily. 10/10/17   Lajean Pike, MD  loratadine  (CLARITIN ) 10 MG tablet Take 1 tablet (10 mg total) by mouth daily. 10/19/22   Austine Lefort, MD  losartan  (COZAAR ) 50 MG tablet Take 1 tablet (50 mg total) by mouth daily. 10/19/22   Austine Lefort, MD  metFORMIN  (GLUCOPHAGE ) 500 MG tablet Take 2 tablets (1,000 mg  total) by mouth 2 (two) times daily with a meal. 10/28/23   Austine Lefort, MD  Prucalopride Succinate  (MOTEGRITY ) 2 MG TABS Take 1 tablet (2 mg total) by mouth daily. 10/28/23   Austine Lefort, MD  rosuvastatin  (CRESTOR ) 10 MG tablet Take 1 tablet (10 mg total) by mouth daily. 10/19/22   Austine Lefort, MD  Semaglutide , 2 MG/DOSE, 8 MG/3ML SOPN Inject 2 mg as directed once a week. 10/19/22   Austine Lefort, MD  tamsulosin  (FLOMAX ) 0.4 MG CAPS capsule Take 1 capsule (0.4 mg total) by mouth daily. 10/31/23   Austine Lefort, MD  Vilazodone HCl (VIIBRYD) 40 MG TABS Take by mouth daily.    [provider]       Allergies    Patient has no known allergies.    Review of Systems   Review of Systems Negative except as per HPI Physical Exam Updated Vital Signs BP 119/71   Pulse 70   Temp 97.8 F (36.6 C) (Oral)   Resp 18   Ht 5' (1.524 m)   Wt 91.6 kg   LMP 09/23/2013   SpO2 97%   BMI 39.45 kg/m  Physical Exam Vitals and nursing note reviewed.  Constitutional:      General: She is not in acute distress.    Appearance: She is well-developed. She is not diaphoretic.  HENT:     Head: Normocephalic and atraumatic.  Cardiovascular:     Rate and Rhythm: Normal rate and regular rhythm.     Pulses: Normal pulses.     Heart sounds: Normal heart sounds.  Pulmonary:     Effort: Pulmonary effort is normal.     Breath sounds: Normal breath sounds.  Abdominal:     Palpations: Abdomen is soft.     Tenderness: There is no abdominal tenderness. There is no right CVA tenderness or left CVA tenderness.  Skin:    General: Skin is warm and dry.     Findings: No erythema or rash.  Neurological:     Mental Status: She is alert and oriented to person, place, and time.  Psychiatric:        Behavior: Behavior normal.     ED Results / Procedures / Treatments   Labs (all labs ordered are listed, but only abnormal results are displayed) Labs Reviewed  COMPREHENSIVE METABOLIC PANEL WITH GFR - Abnormal; Notable for the following components:      Result Value   Chloride 113 (*)    CO2 21 (*)    Glucose, Bld 129 (*)    BUN 22 (*)    Creatinine, Ser 1.28 (*)    Calcium  8.7 (*)    Total Protein 6.3 (*)    GFR, Estimated 50 (*)    All other components within normal limits  URINALYSIS, ROUTINE W REFLEX MICROSCOPIC - Abnormal; Notable for the following components:   Leukocytes,Ua MODERATE (*)    Bacteria, UA RARE (*)    All other components within normal limits  URINE CULTURE  LIPASE, BLOOD  CBC WITH DIFFERENTIAL/PLATELET    EKG None  Radiology CT ABDOMEN PELVIS W CONTRAST Result Date:  10/30/2023 CT ABDOMEN PELVIS W CONTRAST 10/30/2023 4:02 PM CDT CLINICAL HISTORY: Female, 53 years old. Patient states left lower quadrant pain. Patient states pain LLQ, states no bm in more than 20 days. ; Lower kidney function from blood work ; HX of partial hysterectomy COMPARISON: CT abdomen and pelvis from 04/05/2023 PROCEDURE COMMENTS: CT of the abdomen and  pelvis was performed following uneventful administration of IV contrast. Oral contrast was administered prior to the examination. CT was performed using one or more dose reduction techniques including automated exposure control, adjustment of the mA and/or kV according to patient size, and/or use of iterative reconstruction technique.Unless otherwise stated, incidental findings identified in this report do not require routine follow-up. 3-D reconstruction volume rendered imaging was performed on a separate workstation if necessary. Unless otherwise stated, incidental findings identified in this report do not require routine follow-up. FINDINGS: Lower thorax: Bilateral lung bases are clear. No pleural or pericardial effusion. Heart is of normal size. Liver and biliary tree: Liver is normal in morphology. No focal liver lesions are identified. Hepatic, portal, and superior mesenteric veins are patent. No intra- or extrahepatic biliary dilatation. Gallbladder: Normal Spleen: Normal. Pancreas: Normal. Adrenal glands: Normal. Kidneys and ureters: Obstructing stone identified in the left distal ureter measuring up to 4 mm (2:62). Immediately above this is a smaller 3 mm stone (2:58). There is moderate upstream hydroureteronephrosis. Normal appearance of the right kidney. No renal calculi identified on the right. Gastrointestinal tract: No bowel obstruction or wall thickening. Moderate stool burden within the colon, correlate with history of constipation. Peritoneal cavity: No free fluid or free air. Bladder: Normal. Pelvic Organs: The uterus is nonvisualized, likely  surgically absent. Vasculature: Normal caliber aorta. IVC is patent. Lymph nodes: No pathologically enlarged lymphadenopathy in the abdomen or pelvis. Abdominal wall: Normal. Musculoskeletal: No suspicious osseous lesions. IMPRESSION: 1. Obstructing stone identified in the left distal ureter measuring up to 4 mm with moderate upstream hydroureteronephrosis. Recommend Urology consultation. 2.  Additional findings as above. Electronically signed by: Edrie Gower MD 10/30/2023 06:38 PM EDT RP Workstation: [id number]    Procedures Procedures    Medications Ordered in ED Medications  HYDROcodone -acetaminophen  (NORCO/VICODIN) 5-325 MG per tablet 1 tablet (1 tablet Oral Given 10/31/23 1636)  ondansetron  (ZOFRAN -ODT) disintegrating tablet 4 mg (4 mg Oral Given 10/31/23 1636)  ketorolac  (TORADOL ) 15 MG/ML injection 15 mg (15 mg Intravenous Given 10/31/23 1845)  sodium chloride  0.9 % bolus 500 mL (500 mLs Intravenous New Bag/Given 10/31/23 1844)  cefTRIAXone  (ROCEPHIN ) 1 g in sodium chloride  0.9 % 100 mL IVPB (1 g Intravenous New Bag/Given 10/31/23 1846)    ED Course/ Medical Decision Making/ A&P                                 Medical Decision Making Amount and/or Complexity of Data Reviewed Labs: ordered.  Risk Prescription drug management.   This patient presents to the ED for concern of LLQ pain, low back pain, constipation, this involves an extensive number of treatment options, and is a complaint that carries with it a high risk of complications and morbidity.  The differential diagnosis includes colitis, diverticulitis, pyelonephritis, nephrolithiasis, ureterolithiasis.   Co morbidities that complicate the patient evaluation  Obesity, pseudotumor cerebri, anxiety, depression, hypertension, kidney stones, migraine, diabetes, OSA   Additional history obtained:  External records from outside source obtained and reviewed including CT report completed outpatient today reviewed as well as  comparison to CT report and imaging from October 2024   Lab Tests:  I Ordered, and personally interpreted labs.  The pertinent results include: Urinalysis with moderate leukocytes, rare bacteria, will send for culture.  CBC within normal notes.,  Specifically normal WBC.  CMP with slightly increased creatinine 1.28 although downtrend from yesterday.  Lipase within normal notes.   Problem  List / ED Course / Critical interventions / Medication management  53 year old female presents upon the recommendation of her PCP after CT outpatient today showed left ureteral stone with hydronephrosis.  Patient has been having right lower abdominal pain and lower back pain, has been going to GI, thought to be related to constipation.  CT does suggest stool burden.  Compared to October 2024 CT, appears to have similar ureteral stone with hydronephrosis at that time.  Labs are obtained, does have moderate leukocytes, will send for culture and provide with Rocephin  here, discharged with Keflex .  White count is normal, afebrile.  Pain is controlled after Norco, Toradol .  Plan is to discharge with prescription for Norco, Zofran , Keflex .  Continue on previously prescribed Flomax  and follow-up with urology.  Dr. Derrick Fling with urology was consulted, agrees with plan of care.  Patient to return to ER for uncontrolled pain, uncontrolled vomiting, fever. I ordered medication including norco, toradol   for pain  Reevaluation of the patient after these medicines showed that the patient improved I have reviewed the patients home medicines and have made adjustments as needed   Consultations Obtained:  I requested consultation with the urologist, Dr. Derrick Fling,  and discussed lab and imaging findings as well as pertinent plan - they recommend: follow up in clinic, agrees with plan of care   Social Determinants of Health:  Has PCP   Test / Admission - Considered:  Stable for dc to follow up with urology with return to ER  precautions          Final Clinical Impression(s) / ED Diagnoses Final diagnoses:  Ureterolithiasis  Constipation, unspecified constipation type    Rx / DC Orders ED Discharge Orders          Ordered    ondansetron  (ZOFRAN -ODT) 4 MG disintegrating tablet  Every 8 hours PRN        10/31/23 1932    HYDROcodone -acetaminophen  (NORCO/VICODIN) 5-325 MG tablet  Every 4 hours PRN        10/31/23 1932    cephALEXin  (KEFLEX ) 500 MG capsule  2 times daily        10/31/23 1933              Darlis Eisenmenger, PA-C 10/31/23 2022    Sallyanne Creamer, DO 11/06/23 1449

## 2023-10-31 NOTE — ED Triage Notes (Signed)
 Pt to er, pt states that she is here for kidney stones, states that her urologist sent her over, states that she might need a stent, states that her blood work has showed an elevated creatinine.

## 2023-10-31 NOTE — ED Provider Triage Note (Signed)
 Emergency Medicine Provider Triage Evaluation Note  Kimberly [name] , a 53 y.o. female  was evaluated in triage.  Pt complains of left flank pain 2 weeks.  Review of Systems  Positive: Abd pain Negative: N  Physical Exam  BP 124/80 (BP Location: Right Arm)   Pulse 80   Temp 98.3 F (36.8 C) (Oral)   Resp 18   Ht 5' (1.524 m)   Wt 91.6 kg   LMP 09/23/2013   SpO2 97%   BMI 39.45 kg/m  Gen:   Awake, no distress   Resp:  Normal effort  MSK:   Moves extremities without difficulty  Other:    Medical Decision Making  Medically screening exam initiated at 4:24 PM.  Appropriate orders placed.  Kimberly [name] was informed that the remainder of the evaluation will be completed by another provider, this initial triage assessment does not replace that evaluation, and the importance of remaining in the ED until their evaluation is complete.     Eudora Heron, PA-C 10/31/23 1624

## 2023-11-01 ENCOUNTER — Ambulatory Visit (HOSPITAL_COMMUNITY)
Admission: RE | Admit: 2023-11-01 | Discharge: 2023-11-01 | Disposition: A | Discharge status: Home / Self Care | Source: Ambulatory Visit | Attending: Urology | Admitting: Urology

## 2023-11-01 ENCOUNTER — Inpatient Hospital Stay (HOSPITAL_BASED_OUTPATIENT_CLINIC_OR_DEPARTMENT_OTHER): Admitting: Anesthesiology

## 2023-11-01 ENCOUNTER — Encounter (HOSPITAL_COMMUNITY): Payer: Self-pay | Admitting: Urology

## 2023-11-01 ENCOUNTER — Other Ambulatory Visit: Payer: Self-pay

## 2023-11-01 ENCOUNTER — Inpatient Hospital Stay (HOSPITAL_COMMUNITY)

## 2023-11-01 ENCOUNTER — Telehealth: Payer: Self-pay

## 2023-11-01 ENCOUNTER — Other Ambulatory Visit: Payer: Self-pay | Admitting: Urology

## 2023-11-01 ENCOUNTER — Inpatient Hospital Stay (HOSPITAL_COMMUNITY): Admitting: Anesthesiology

## 2023-11-01 ENCOUNTER — Encounter (HOSPITAL_COMMUNITY)
Admission: RE | Disposition: A | Discharge status: Home / Self Care | Payer: Self-pay | Source: Ambulatory Visit | Attending: Urology

## 2023-11-01 DIAGNOSIS — G4733 Obstructive sleep apnea (adult) (pediatric): Secondary | ICD-10-CM | POA: Diagnosis not present

## 2023-11-01 DIAGNOSIS — N201 Calculus of ureter: Secondary | ICD-10-CM

## 2023-11-01 DIAGNOSIS — E119 Type 2 diabetes mellitus without complications: Secondary | ICD-10-CM | POA: Insufficient documentation

## 2023-11-01 DIAGNOSIS — I10 Essential (primary) hypertension: Secondary | ICD-10-CM | POA: Insufficient documentation

## 2023-11-01 DIAGNOSIS — F418 Other specified anxiety disorders: Secondary | ICD-10-CM | POA: Diagnosis not present

## 2023-11-01 HISTORY — PX: CYSTOSCOPY/URETEROSCOPY/HOLMIUM LASER/STENT PLACEMENT: SHX6546

## 2023-11-01 LAB — URINE CULTURE: Culture: <10000 — AB

## 2023-11-01 LAB — GLUCOSE, CAPILLARY
Glucose-Capillary: 77 mg/dL (ref 70–99)
Glucose-Capillary: 82 mg/dL (ref 70–99)

## 2023-11-01 SURGERY — CYSTOSCOPY/URETEROSCOPY/HOLMIUM LASER/STENT PLACEMENT
Anesthesia: General | Site: Bladder | Laterality: Left

## 2023-11-01 MED ORDER — MIDAZOLAM HCL 2 MG/2ML IJ SOLN
INTRAMUSCULAR | Status: DC | PRN
  Administered 2023-11-01: 2 mg via INTRAVENOUS

## 2023-11-01 MED ORDER — DEXAMETHASONE SODIUM PHOSPHATE 10 MG/ML IJ SOLN
INTRAMUSCULAR | Status: AC
  Filled 2023-11-01: qty 1

## 2023-11-01 MED ORDER — PROPOFOL 500 MG/50ML IV EMUL
INTRAVENOUS | Status: DC | PRN
  Administered 2023-11-01: 125 ug/kg/min via INTRAVENOUS

## 2023-11-01 MED ORDER — OXYBUTYNIN CHLORIDE ER 10 MG PO TB24
10.0000 mg | ORAL_TABLET | Freq: Every day | ORAL | 0 refills | Status: AC | PRN
Start: 2023-11-01 — End: 2023-11-15

## 2023-11-01 MED ORDER — DEXAMETHASONE SODIUM PHOSPHATE 10 MG/ML IJ SOLN
INTRAMUSCULAR | Status: DC | PRN
  Administered 2023-11-01: 10 mg via INTRAVENOUS

## 2023-11-01 MED ORDER — FENTANYL CITRATE (PF) 100 MCG/2ML IJ SOLN
INTRAMUSCULAR | Status: DC | PRN
  Administered 2023-11-01: 50 ug via INTRAVENOUS
  Administered 2023-11-01 (×2): 25 ug via INTRAVENOUS

## 2023-11-01 MED ORDER — OXYCODONE HCL 5 MG/5ML PO SOLN: 5.0000 mg | Freq: Once | ORAL | Status: DC | PRN

## 2023-11-01 MED ORDER — FENTANYL CITRATE PF 50 MCG/ML IJ SOSY: 25.0000 ug | PREFILLED_SYRINGE | INTRAMUSCULAR | Status: DC | PRN

## 2023-11-01 MED ORDER — OXYCODONE HCL 5 MG PO TABS: 5.0000 mg | ORAL_TABLET | Freq: Once | ORAL | Status: DC | PRN

## 2023-11-01 MED ORDER — LIDOCAINE HCL (CARDIAC) PF 100 MG/5ML IV SOSY
PREFILLED_SYRINGE | INTRAVENOUS | Status: DC | PRN
  Administered 2023-11-01: 100 mg via INTRAVENOUS

## 2023-11-01 MED ORDER — IOHEXOL 300 MG/ML  SOLN
INTRAMUSCULAR | Status: DC | PRN
  Administered 2023-11-01: 10 mL

## 2023-11-01 MED ORDER — ONDANSETRON HCL 4 MG/2ML IJ SOLN
INTRAMUSCULAR | Status: AC
  Filled 2023-11-01: qty 2

## 2023-11-01 MED ORDER — SODIUM CHLORIDE 0.9 % IR SOLN
Status: DC | PRN
  Administered 2023-11-01: 3000 mL via INTRAVESICAL

## 2023-11-01 MED ORDER — LIDOCAINE HCL (PF) 2 % IJ SOLN
INTRAMUSCULAR | Status: AC
  Filled 2023-11-01: qty 5

## 2023-11-01 MED ORDER — LACTATED RINGERS IV SOLN: INTRAVENOUS | Status: DC | PRN

## 2023-11-01 MED ORDER — SUGAMMADEX SODIUM 200 MG/2ML IV SOLN
INTRAVENOUS | Status: DC | PRN
  Administered 2023-11-01: 200 mg via INTRAVENOUS

## 2023-11-01 MED ORDER — MIDAZOLAM HCL 2 MG/2ML IJ SOLN
INTRAMUSCULAR | Status: AC
  Filled 2023-11-01: qty 2

## 2023-11-01 MED ORDER — PROPOFOL 10 MG/ML IV BOLUS
INTRAVENOUS | Status: DC | PRN
  Administered 2023-11-01: 160 mg via INTRAVENOUS

## 2023-11-01 MED ORDER — ROCURONIUM BROMIDE 100 MG/10ML IV SOLN
INTRAVENOUS | Status: DC | PRN
  Administered 2023-11-01: 50 mg via INTRAVENOUS

## 2023-11-01 MED ORDER — DROPERIDOL 2.5 MG/ML IJ SOLN: 0.6250 mg | Freq: Once | INTRAMUSCULAR | Status: DC | PRN

## 2023-11-01 MED ORDER — KETOROLAC TROMETHAMINE 30 MG/ML IJ SOLN
INTRAMUSCULAR | Status: DC | PRN
Start: 2023-11-01 — End: 2023-11-01
  Administered 2023-11-01: 30 mg via INTRAVENOUS

## 2023-11-01 MED ORDER — PROPOFOL 10 MG/ML IV BOLUS
INTRAVENOUS | Status: AC
  Filled 2023-11-01: qty 20

## 2023-11-01 MED ORDER — FENTANYL CITRATE (PF) 100 MCG/2ML IJ SOLN
INTRAMUSCULAR | Status: AC
  Filled 2023-11-01: qty 2

## 2023-11-01 MED ORDER — CEFAZOLIN SODIUM-DEXTROSE 2-4 GM/100ML-% IV SOLN
2.0000 g | INTRAVENOUS | Status: AC
  Administered 2023-11-01: 2 g via INTRAVENOUS
  Filled 2023-11-01: qty 100

## 2023-11-01 MED ORDER — ROCURONIUM BROMIDE 10 MG/ML (PF) SYRINGE
PREFILLED_SYRINGE | INTRAVENOUS | Status: AC
  Filled 2023-11-01: qty 10

## 2023-11-01 MED ORDER — ONDANSETRON HCL 4 MG/2ML IJ SOLN
INTRAMUSCULAR | Status: DC | PRN
  Administered 2023-11-01: 4 mg via INTRAVENOUS

## 2023-11-01 SURGICAL SUPPLY — 21 items
BAG COUNTER SPONGE SURGICOUNT (BAG) IMPLANT
BAG URO CATCHER STRL LF (MISCELLANEOUS) ×1 IMPLANT
BASKET ZERO TIP NITINOL 2.4FR (BASKET) IMPLANT
BENZOIN TINCTURE PRP APPL 2/3 (GAUZE/BANDAGES/DRESSINGS) IMPLANT
CATH URETL OPEN END 6FR 70 (CATHETERS) IMPLANT
DRSG TEGADERM 2-3/8X2-3/4 SM (GAUZE/BANDAGES/DRESSINGS) IMPLANT
GLOVE SURG LX STRL 7.5 STRW (GLOVE) ×1 IMPLANT
GOWN STRL REUS W/ TWL XL LVL3 (GOWN DISPOSABLE) ×1 IMPLANT
GUIDEWIRE STR DUAL SENSOR (WIRE) ×1 IMPLANT
GUIDEWIRE ZIPWRE .038 STRAIGHT (WIRE) IMPLANT
KIT TURNOVER KIT A (KITS) IMPLANT
LASER FIB FLEXIVA PULSE ID 365 (Laser) IMPLANT
MANIFOLD NEPTUNE II (INSTRUMENTS) ×1 IMPLANT
PACK CYSTO (CUSTOM PROCEDURE TRAY) ×1 IMPLANT
SHEATH NAVIGATOR HD 12/14X36 (SHEATH) IMPLANT
SOL .9 NS 3000ML IRR UROMATIC (IV SOLUTION) IMPLANT
STENT URET 6FRX22 CONTOUR (STENTS) IMPLANT
TRACTIP FLEXIVA PULS ID 200XHI (Laser) IMPLANT
TRACTIP FLEXIVA PULSE ID 200 (Laser) IMPLANT
TUBING CONNECTING 10 (TUBING) ×1 IMPLANT
TUBING UROLOGY SET (TUBING) ×1 IMPLANT

## 2023-11-01 NOTE — Telephone Encounter (Signed)
 Kimberly [name] (Key: [id number])  Your information has been sent to [facility].

## 2023-11-01 NOTE — Op Note (Signed)
 Date of procedure: 11/01/23  Preoperative diagnosis:  Left ureteral stone  Postoperative diagnosis:  Same  Procedure: Cystoscopy, left ureteroscopy, laser lithotripsy, left retrograde pyelogram with intraoperative interpretation, left ureteral stent placement  Surgeon: [name], MD  Anesthesia: General  Complications: None  Intraoperative findings:  Normal bladder, ureteral orifices orthotopic bilaterally Impacted left mid ureteral stone, dusted and fragments extracted free, smaller stone upstream also dusted No extravasation on retrograde, uncomplicated stent placement  EBL: Minimal  Specimens: None  Drains: Left 6 French by 22 cm ureteral stent  Indication: Kimberly [name] is a 53 y.o. patient with 5 mm left mid ureteral stone and poorly controlled renal colic, no clinical evidence of infection who opted for ureteroscopy and laser lithotripsy.  After reviewing the management options for treatment, they elected to proceed with the above surgical procedure(s). We have discussed the potential benefits and risks of the procedure, side effects of the proposed treatment, the likelihood of the patient achieving the goals of the procedure, and any potential problems that might occur during the procedure or recuperation. Informed consent has been obtained.  Description of procedure:  The patient was taken to the operating room and general anesthesia was induced. SCDs were placed for DVT prophylaxis. The patient was placed in the dorsal lithotomy position, prepped and draped in the usual sterile fashion, and preoperative antibiotics(Ancef ) were administered. A preoperative time-out was performed.   A 21 French rigid cystoscope was used to intubate the urethra and thorough cystoscopy was performed.  The bladder was grossly normal, and ureteral orifices orthotopic bilaterally.  A sensor wire was advanced into the left ureteral orifice and advanced easily up to the kidney under  fluoroscopic vision.  A semirigid long ureteroscope was advanced alongside the wire and in the mid ureter a yellow and black calcium  oxalate appearing stone was impacted with significant ureteral edema.  A 240 m laser fiber on settings of 1.0 J and 10 Hz was used to methodically fragment the stone, and all fragments were irrigated free from the ureter.  There was a smaller 3 mm stone upstream which was also fragmented and irrigated free from the ureter.  I advanced the semirigid ureteroscope up to the proximal ureter and a retrograde pyelogram was performed showing no extravasation or filling defects.  Careful pullback ureteroscopy showed no stone fragments larger than the laser fiber, and no evidence of ureteral injury.  The rigid cystoscope was backloaded over the wire and a 6 [place] by 22 cm ureteral stent was placed uneventfully with a curl in the midpole, as well as under direct vision in the bladder.  The bladder was drained and this concluded the procedure.  Disposition: Stable to PACU  Plan: Stent removal in 1 to 2 weeks(Kimberly [name] or Kimberly [name])   [name], MD

## 2023-11-01 NOTE — Anesthesia Preprocedure Evaluation (Addendum)
 Anesthesia Evaluation  Patient identified by MRN, date of birth, ID band Patient awake    Reviewed: Allergy & Precautions, NPO status , Patient's Chart, lab work & pertinent test results, Unable to perform ROS - Chart review only  History of Anesthesia Complications Negative for: history of anesthetic complications  Airway Mallampati: II  TM Distance: >3 FB Neck ROM: Full    Dental  (+) Edentulous Upper, Partial Lower, Missing   Pulmonary sleep apnea    Pulmonary exam normal        Cardiovascular hypertension, Pt. on medications and Pt. on home beta blockers Normal cardiovascular exam     Neuro/Psych  Headaches  Anxiety Depression       GI/Hepatic negative GI ROS, Neg liver ROS,,,  Endo/Other  diabetes, Type 2    Renal/GU LEFT URETERAL STONE     Musculoskeletal negative musculoskeletal ROS (+)    Abdominal   Peds  Hematology negative hematology ROS (+)   Anesthesia Other Findings Day of surgery medications reviewed with patient.  Reproductive/Obstetrics                             Anesthesia Physical Anesthesia Plan  ASA: 2 and emergent  Anesthesia Plan: General   Post-op Pain Management: Tylenol  PO (pre-op)*   Induction: Intravenous  PONV Risk Score and Plan: 3 and Treatment may vary due to age or medical condition, Ondansetron , Dexamethasone  and Midazolam   Airway Management Planned: LMA  Additional Equipment: None  Intra-op Plan:   Post-operative Plan: Extubation in OR  Informed Consent: I have reviewed the patients History and Physical, chart, labs and discussed the procedure including the risks, benefits and alternatives for the proposed anesthesia with the patient or authorized representative who has indicated his/her understanding and acceptance.     Dental advisory given  Plan Discussed with: CRNA  Anesthesia Plan Comments:        Anesthesia Quick  Evaluation

## 2023-11-01 NOTE — Progress Notes (Signed)
 Surgical Physician Order Form [facility] [place]  * Scheduling expectation : Today, 1:30 PM, Maryan Smalling  *Length of Case: 45 minutes  *Clearance needed: no  *Anticoagulation Instructions: May continue all anticoagulants  *Aspirin Instructions: Ok to continue all  *Post-op visit Date/Instructions:  TBD  *Diagnosis: Left Ureteral Stone  *Procedure: left Ureteroscopy w/laser lithotripsy & stent placement ([id number])   Additional orders: N/A  -Admit type: OUTpatient  -Anesthesia: General  -VTE Prophylaxis Standing Order SCD's       Other:   -Standing Lab Orders Per Anesthesia    Lab other: None  -Standing Test orders EKG/Chest x-ray per Anesthesia       Test other:   - Medications:  Ancef  2gm IV  -Other orders:  N/A

## 2023-11-01 NOTE — Anesthesia Procedure Notes (Signed)
 Procedure Name: Intubation Date/Time: 11/01/2023 2:12 PM  Performed by: Vella Gey, CRNAPatient Re-evaluated:Patient Re-evaluated prior to induction Oxygen Delivery Method: Circle system utilized Preoxygenation: Pre-oxygenation with 100% oxygen Induction Type: IV induction Ventilation: Oral airway inserted - appropriate to patient size and Mask ventilation without difficulty Laryngoscope Size: Glidescope and 3 Grade View: Grade I Tube type: Parker flex tip Number of attempts: 1 Airway Equipment and Method: Rigid stylet and Video-laryngoscopy Placement Confirmation: ETT inserted through vocal cords under direct vision, positive ETCO2 and breath sounds checked- equal and bilateral Secured at: 22 cm Tube secured with: Tape Dental Injury: Teeth and Oropharynx as per pre-operative assessment  Comments: Previous cervical fusion

## 2023-11-01 NOTE — H&P (Signed)
   11/01/23 12:49 PM   Kimberly [name] [birth year] [id number]  CC: Left ureteral stone  HPI: 53 year old female with 5 mm left mid ureteral stone, no clinical or laboratory evidence of infection and poorly controlled renal colic.  She was seen in clinic today by Dr. Parke Boll and she opted for left ureteroscopy.  She denies any fevers or chills.   PMH: Past Medical History:  Diagnosis Date   Abnormal vaginal Pap smear    tx with cryotherapy   Anxiety    Depression    Diabetes mellitus without complication (HCC)    History of kidney stones    passed stone - no surgery   Hypertension    Migraine    otc med prn   Obesity    OSA (obstructive sleep apnea)    severe- RDI 43   Pseudotumor cerebri    SVD (spontaneous vaginal delivery)    x 3   Tension headache     Surgical History: Past Surgical History:  Procedure Laterality Date   ABDOMINAL HYSTERECTOMY     ANTERIOR CERVICAL DECOMP/DISCECTOMY FUSION N/A 01/03/2016   Procedure: ANTERIOR CERVICAL DECOMPRESSION FUSION CERVICAL FIVE-SIX,CERVICAL SIX-SEVEN.;  Surgeon: Augusto Blonder, MD;  Location: [facility];  Service: Neurosurgery;  Laterality: N/A;  right side approach   CRYOTHERAPY     LAPAROSCOPIC ASSISTED VAGINAL HYSTERECTOMY Bilateral 04/21/2014   Procedure: LAPAROSCOPIC ASSISTED VAGINAL HYSTERECTOMY, BILATERAL SALPINGECTOMY;  Surgeon: Cyd Dowse, MD;  Location: [facility];  Service: Gynecology;  Laterality: Bilateral;   MANDIBLE FRACTURE SURGERY  2002   x 1   TUBAL LIGATION  11/2003   WISDOM TOOTH EXTRACTION       Family History: Family History  Problem Relation Age of Onset   Hypertension Mother    Diabetes Mother    Breast cancer Mother    Diabetes Sister    Hypertension Sister     Social History:  reports that she has never smoked. She has never used smokeless tobacco. She reports current alcohol use of about 1.0 standard drink of alcohol per week. She reports that she does not use drugs.  Physical  Exam: LMP 09/23/2013    Constitutional:  Alert and oriented, No acute distress. Cardiovascular: Regular rate and rhythm Respiratory: Clear to auscultation bilaterally GI: Abdomen is soft, nontender, nondistended, no abdominal masses   Laboratory Data: Urine urinalysis 5/8 0-5 squamous cells, 0-5 RBC, 11-20 WBC, rare bacteria moderate leukocytes, nitrite negative  Pertinent Imaging: I have personally viewed and interpreted the CT scan showing a 5 mm left mid ureteral stone with upstream hydronephrosis.  Assessment & Plan:   53 year old female with 5 mm left mid ureteral stone, poorly controlled renal colic, no clinical or laboratory evidence of infection who opted for definitive management with ureteroscopy and laser lithotripsy.  We specifically discussed the risks ureteroscopy including bleeding, infection/sepsis, stent related symptoms including flank pain/urgency/frequency/incontinence/dysuria, ureteral injury, ureteral stricture, inability to access stone, or need for staged or additional procedures. We specifically discussed possible need for stent placement with delayed stone management if evidence of infection or purulence intraoperatively.  Left ureteroscopy, laser lithotripsy, stent placement today   [name], MD 11/01/2023  [facility] [address], [address] [place], [place] [address] [phone]

## 2023-11-01 NOTE — Transfer of Care (Signed)
 Immediate Anesthesia Transfer of Care Note  Patient: Kimberly [name]  Procedure(s) Performed: CYSTOSCOPY/URETEROSCOPY/HOLMIUM LASER/STENT PLACEMENT (Left: Bladder)  Patient Location: PACU  Anesthesia Type:General  Level of Consciousness: awake  Airway & Oxygen Therapy: Patient Spontanous Breathing and Patient connected to face mask oxygen  Post-op Assessment: Report given to RN and Post -op Vital signs reviewed and stable  Post vital signs: Reviewed and stable  Last Vitals:  Vitals Value Taken Time  BP 162/93 11/01/23 1502  Temp    Pulse 69 11/01/23 1504  Resp 11 11/01/23 1504  SpO2 100 % 11/01/23 1504  Vitals shown include unfiled device data.  Last Pain:  Vitals:   11/01/23 1308  TempSrc: Oral         Complications: No notable events documented.

## 2023-11-03 NOTE — Anesthesia Postprocedure Evaluation (Signed)
 Anesthesia Post Note  Patient: Kimberly [name]  Procedure(s) Performed: CYSTOSCOPY/URETEROSCOPY/HOLMIUM LASER/STENT PLACEMENT (Left: Bladder)     Patient location during evaluation: PACU Anesthesia Type: General Level of consciousness: sedated and patient cooperative Pain management: pain level controlled Vital Signs Assessment: post-procedure vital signs reviewed and stable Respiratory status: spontaneous breathing Cardiovascular status: stable Anesthetic complications: no   No notable events documented.  Last Vitals:  Vitals:   11/01/23 1545 11/01/23 1557  BP: (!) 150/84 (!) 146/99  Pulse: (!) 56 63  Resp: 12 16  Temp: 36.4 C   SpO2: 100% 100%    Last Pain:  Vitals:   11/01/23 1557  TempSrc:   PainSc: 1                  Gorman Laughter

## 2023-11-04 ENCOUNTER — Other Ambulatory Visit: Payer: Self-pay | Admitting: Family Medicine

## 2023-11-04 ENCOUNTER — Encounter (HOSPITAL_COMMUNITY): Payer: Self-pay | Admitting: Urology

## 2023-11-04 MED ORDER — FLUCONAZOLE 150 MG PO TABS: 150.0000 mg | ORAL_TABLET | Freq: Once | ORAL | 0 refills | Status: AC

## 2023-11-11 ENCOUNTER — Ambulatory Visit: Admitting: Urology

## 2023-11-11 VITALS — BP 106/67 | HR 87

## 2023-11-11 DIAGNOSIS — Z466 Encounter for fitting and adjustment of urinary device: Secondary | ICD-10-CM | POA: Diagnosis not present

## 2023-11-11 MED ORDER — CEPHALEXIN 250 MG PO CAPS
500.0000 mg | ORAL_CAPSULE | Freq: Once | ORAL | Status: AC
  Administered 2023-11-11: 500 mg via ORAL

## 2023-11-11 MED ORDER — LIDOCAINE HCL URETHRAL/MUCOSAL 2 % EX GEL
1.0000 | Freq: Once | CUTANEOUS | Status: AC
Start: 2023-11-11 — End: 2023-11-11
  Administered 2023-11-11: 1 via URETHRAL

## 2023-11-11 NOTE — Progress Notes (Signed)
 Cystoscopy Procedure Note:  Indication: Stent removal s/p 11/01/2023 left ureteroscopy, laser lithotripsy for 5 mm distal stone([facility])  Keflex  given for prophylaxis  After informed consent and discussion of the procedure and its risks, Kimberly [name] was positioned and prepped in the standard fashion. Cystoscopy was performed with a flexible cystoscope. The stent was grasped with flexible graspers and removed in its entirety. The patient tolerated the procedure well.  Findings: Uncomplicated stent removal  Assessment and Plan: We discussed general stone prevention strategies including adequate hydration with goal of producing 2.5 L of urine daily, increasing citric acid intake, increasing calcium  intake during high oxalate meals, minimizing animal protein, and decreasing salt intake. Information about dietary recommendations given today.   Follow up with urology as needed  Lawerence Pressman, MD 11/11/2023

## 2023-11-11 NOTE — Patient Instructions (Signed)
 Kimberly [name]

## 2023-11-13 ENCOUNTER — Encounter: Admitting: Urology

## 2023-12-05 ENCOUNTER — Other Ambulatory Visit: Payer: Self-pay | Admitting: Family Medicine

## 2023-12-05 MED ORDER — SEMAGLUTIDE (2 MG/DOSE) 8 MG/3ML ~~LOC~~ SOPN: 2.0000 mg | PEN_INJECTOR | SUBCUTANEOUS | 3 refills | Status: DC

## 2023-12-05 NOTE — Telephone Encounter (Signed)
 Yes just get them to send the records.

## 2023-12-09 ENCOUNTER — Ambulatory Visit: Admitting: Family Medicine

## 2023-12-19 ENCOUNTER — Encounter: Payer: Self-pay | Admitting: Family Medicine

## 2023-12-19 ENCOUNTER — Other Ambulatory Visit: Payer: Self-pay

## 2023-12-19 ENCOUNTER — Ambulatory Visit: Admitting: Family Medicine

## 2023-12-19 ENCOUNTER — Telehealth: Payer: Self-pay | Admitting: Family Medicine

## 2023-12-19 ENCOUNTER — Telehealth: Payer: Self-pay

## 2023-12-19 VITALS — BP 124/88 | HR 74 | Temp 98.5°F | Ht 60.0 in | Wt 212.0 lb

## 2023-12-19 DIAGNOSIS — Z794 Long term (current) use of insulin: Secondary | ICD-10-CM

## 2023-12-19 DIAGNOSIS — E7800 Pure hypercholesterolemia, unspecified: Secondary | ICD-10-CM

## 2023-12-19 DIAGNOSIS — E1169 Type 2 diabetes mellitus with other specified complication: Secondary | ICD-10-CM | POA: Diagnosis not present

## 2023-12-19 DIAGNOSIS — I10 Essential (primary) hypertension: Secondary | ICD-10-CM

## 2023-12-19 MED ORDER — ROSUVASTATIN CALCIUM 10 MG PO TABS: 10.0000 mg | ORAL_TABLET | Freq: Every day | ORAL | 1 refills | Status: DC

## 2023-12-19 MED ORDER — TIRZEPATIDE 7.5 MG/0.5ML ~~LOC~~ SOAJ: 7.5000 mg | SUBCUTANEOUS | 3 refills | Status: DC

## 2023-12-19 MED ORDER — LOSARTAN POTASSIUM 50 MG PO TABS: 50.0000 mg | ORAL_TABLET | Freq: Every day | ORAL | 1 refills | Status: DC

## 2023-12-19 NOTE — Telephone Encounter (Signed)
 Prescription Request  12/19/2023  LOV: 10/28/23  What is the name of the medication or equipment? losartan  (COZAAR ) 50 MG tablet [[id number]]   Have you contacted your pharmacy to request a refill? Yes   Which pharmacy would you like this sent to?  [id number] [place] [place] - [address] [place] [address] [place] [address] Phone: [phone] Fax: [phone]    Patient notified that their request is being sent to the clinical staff for review and that they should receive a response within 2 business days.   Please advise at [facility] [phone]

## 2023-12-19 NOTE — Telephone Encounter (Signed)
 Prescription Request  12/19/2023  LOV: 10/28/2023  What is the name of the medication or equipment? rosuvastatin  (CRESTOR ) 10 MG tablet   Have you contacted your pharmacy to request a refill? Yes   Which pharmacy would you like this sent to?  [id number] [place], [place] - [address] [place] [address] [place] [address] Phone: [phone] Fax: [phone]    Patient notified that their request is being sent to the clinical staff for review and that they should receive a response within 2 business days.   Please advise at [facility] [phone]

## 2023-12-19 NOTE — Telephone Encounter (Signed)
 Medication has been sent in

## 2023-12-19 NOTE — Progress Notes (Signed)
 Subjective:    Patient ID: Kimberly [name], female    DOB: 16-Nov-1970, 53 y.o.   MRN: [id number]  Patient was seen about 2 months ago.  At that point, she was in acute renal failure.  This was a surprise.  She was being treated for chronic constipation.  At roughly the same time, she had a CT scan that revealed hydronephrosis secondary to an obstructive kidney stone.  She admittedly was seen allergy who placed a stent and then extracted the stone.  She is here today to follow-up her kidney function.  Her A1c in April was 5.5.  Her A1c on May 5 was 5.7.  She is still taking Ozempic  2 mg subcu weekly. Wt Readings from Last 3 Encounters:  12/19/23 212 lb (96.2 kg)  11/01/23 202 lb (91.6 kg)  10/31/23 202 lb (91.6 kg)   Patient's weight loss has peaked.  In fact her weight has gone up approximately 10 pounds since her last visit.  She is interested in possibly trying Mounjaro  for additional weight loss.  Her BMI today is 41.4.  Blood pressure is excellent at 124/88. Past Medical History:  Diagnosis Date   Abnormal vaginal Pap smear    tx with cryotherapy   Anxiety    Depression    Diabetes mellitus without complication (HCC)    History of kidney stones    passed stone - no surgery   Hypertension    Migraine    otc med prn   Obesity    OSA (obstructive sleep apnea)    severe- RDI 43   Pseudotumor cerebri    SVD (spontaneous vaginal delivery)    x 3   Tension headache    Past Surgical History:  Procedure Laterality Date   ABDOMINAL HYSTERECTOMY     ANTERIOR CERVICAL DECOMP/DISCECTOMY FUSION N/A 01/03/2016   Procedure: ANTERIOR CERVICAL DECOMPRESSION FUSION CERVICAL FIVE-SIX,CERVICAL SIX-SEVEN.;  Surgeon: Gerldine Maizes, MD;  Location: [facility];  Service: Neurosurgery;  Laterality: N/A;  right side approach   CRYOTHERAPY     CYSTOSCOPY/URETEROSCOPY/HOLMIUM LASER/STENT PLACEMENT Left 11/01/2023   Procedure: CYSTOSCOPY/URETEROSCOPY/HOLMIUM LASER/STENT PLACEMENT;  Surgeon:  Francisca Redell BROCKS, MD;  Location: [facility];  Service: Urology;  Laterality: Left;  DR. GRISELDA CARD   LAPAROSCOPIC ASSISTED VAGINAL HYSTERECTOMY Bilateral 04/21/2014   Procedure: LAPAROSCOPIC ASSISTED VAGINAL HYSTERECTOMY, BILATERAL SALPINGECTOMY;  Surgeon: Krystal Deaner, MD;  Location: [facility];  Service: Gynecology;  Laterality: Bilateral;   MANDIBLE FRACTURE SURGERY  2002   x 1   TUBAL LIGATION  11/2003   WISDOM TOOTH EXTRACTION     Current Outpatient Medications on File Prior to Visit  Medication Sig Dispense Refill   acetaZOLAMIDE  (DIAMOX ) 250 MG tablet TAKE 1 TABLET BY MOUTH TWICE A DAY 180 tablet 0   ALPRAZolam  (XANAX ) 1 MG tablet Take 1 tablet (1 mg total) by mouth 3 (three) times daily as needed. 90 tablet 2   atenolol  (TENORMIN ) 100 MG tablet Take 1 tablet (100 mg total) by mouth daily. 90 tablet 3   buPROPion  (WELLBUTRIN  XL) 300 MG 24 hr tablet Take 1 tablet (300 mg total) by mouth daily. 90 tablet 1   Continuous Blood Gluc Sensor (DEXCOM G5 MOB/G4 PLAT SENSOR) MISC USE TO CHECK BLOOD SUGARS UP TO 6 X PER DAY FOR INSULIN  DEPENDANT DIABETIC. 8 each 3   estradiol (VIVELLE-DOT) 0.075 MG/24HR Place onto the skin.     HYDROcodone -acetaminophen  (NORCO/VICODIN) 5-325 MG tablet Take 1 tablet by mouth every 4 (four) hours as needed. 10 tablet  0   lamoTRIgine (LAMICTAL) 100 MG tablet Take 100 mg by mouth daily.     Lancets Misc. (ACCU-CHEK FASTCLIX LANCET) KIT 1 each by Does not apply route daily. Use to check blood sugar 5 times daily. 1 kit 12   loratadine  (CLARITIN ) 10 MG tablet Take 1 tablet (10 mg total) by mouth daily. 90 tablet 1   losartan  (COZAAR ) 50 MG tablet Take 1 tablet (50 mg total) by mouth daily. 90 tablet 1   metFORMIN  (GLUCOPHAGE ) 500 MG tablet Take 2 tablets (1,000 mg total) by mouth 2 (two) times daily with a meal. 360 tablet 3   ondansetron  (ZOFRAN -ODT) 4 MG disintegrating tablet Take 1 tablet (4 mg total) by mouth every 8 (eight) hours as needed for nausea or vomiting. 12  tablet 0   rosuvastatin  (CRESTOR ) 10 MG tablet Take 1 tablet (10 mg total) by mouth daily. 90 tablet 1   Semaglutide , 2 MG/DOSE, 8 MG/3ML SOPN Inject 2 mg as directed once a week. 6 mL 3   traZODone (DESYREL) 100 MG tablet Take 100 mg by mouth at bedtime.     Vilazodone HCl (VIIBRYD) 40 MG TABS Take by mouth daily.     No current facility-administered medications on file prior to visit.       No Known Allergies Social History   Socioeconomic History   Marital status: Divorced    Spouse name: Not on file   Number of children: Not on file   Years of education: Not on file   Highest education level: Not on file  Occupational History   Not on file  Tobacco Use   Smoking status: Never   Smokeless tobacco: Never  Substance and Sexual Activity   Alcohol use: Yes    Alcohol/week: 1.0 standard drink of alcohol    Types: 1 Glasses of wine per week    Comment: one glass of wine per week   Drug use: No   Sexual activity: Yes    Birth control/protection: Surgical  Other Topics Concern   Not on file  Social History Narrative   Not on file   Social Drivers of Health   Financial Resource Strain: Not on file  Food Insecurity: Not on file  Transportation Needs: Not on file  Physical Activity: Not on file  Stress: Not on file  Social Connections: Not on file  Intimate Partner Violence: Not on file     Review of Systems  All other systems reviewed and are negative.      Objective:   Physical Exam Vitals reviewed.  Constitutional:      General: She is not in acute distress.    Appearance: She is well-developed. She is not diaphoretic.   Cardiovascular:     Rate and Rhythm: Normal rate and regular rhythm.     Heart sounds: Normal heart sounds.  Pulmonary:     Effort: Pulmonary effort is normal. No respiratory distress.     Breath sounds: Normal breath sounds. No wheezing or rales.  Abdominal:     General: Bowel sounds are normal. There is no distension.      Palpations: Abdomen is soft.     Tenderness: There is no abdominal tenderness. There is no guarding or rebound.  Lymphadenopathy:     Cervical: No cervical adenopathy.           Assessment & Plan:  Type 2 diabetes mellitus with other specified complication, with long-term current use of insulin  (HCC) - Plan: CBC with Differential/Platelet, Comprehensive metabolic  panel with GFR Diabetic foot exam was performed today and is normal.  Patient has excellent peripheral pulses and she denies symptoms of neuropathy.  Her blood pressure is excellent.  We will switch Ozempic  to Mounjaro  7.5 mg subcu weekly.  We are making this change to try to facilitate additional weight loss.  Prior to me I do not want to increase the dose depending upon if she is nauseated.  I would like to recheck her renal function today.  She is now 1 month out from having the stent removed.  Therefore we can get an accurate assessment of her baseline kidney function today.

## 2023-12-20 ENCOUNTER — Ambulatory Visit: Payer: Self-pay | Admitting: Family Medicine

## 2023-12-20 ENCOUNTER — Telehealth: Payer: Self-pay | Admitting: Pharmacy Technician

## 2023-12-20 ENCOUNTER — Other Ambulatory Visit (HOSPITAL_COMMUNITY): Payer: Self-pay

## 2023-12-20 LAB — CBC WITH DIFFERENTIAL/PLATELET
Absolute Lymphocytes: 2140 {cells}/uL (ref 850–3900)
Absolute Monocytes: 557 {cells}/uL (ref 200–950)
Basophils Absolute: 26 {cells}/uL (ref 0–200)
Basophils Relative: 0.3 %
Eosinophils Absolute: 70 {cells}/uL (ref 15–500)
Eosinophils Relative: 0.8 %
HCT: 44.5 % (ref 35.0–45.0)
Hemoglobin: 14 g/dL (ref 11.7–15.5)
MCH: 27.9 pg (ref 27.0–33.0)
MCHC: 31.5 g/dL — ABNORMAL LOW (ref 32.0–36.0)
MCV: 88.8 fL (ref 80.0–100.0)
MPV: 11.9 fL (ref 7.5–12.5)
Monocytes Relative: 6.4 %
Neutro Abs: 5907 {cells}/uL (ref 1500–7800)
Neutrophils Relative %: 67.9 %
Platelets: 165 10*3/uL (ref 140–400)
RBC: 5.01 10*6/uL (ref 3.80–5.10)
RDW: 12 % (ref 11.0–15.0)
Total Lymphocyte: 24.6 %
WBC: 8.7 10*3/uL (ref 3.8–10.8)

## 2023-12-20 LAB — COMPREHENSIVE METABOLIC PANEL WITH GFR
AG Ratio: 2 (calc) (ref 1.0–2.5)
ALT: 9 U/L (ref 6–29)
AST: 12 U/L (ref 10–35)
Albumin: 4.5 g/dL (ref 3.6–5.1)
Alkaline phosphatase (APISO): 91 U/L (ref 37–153)
BUN/Creatinine Ratio: 20 (calc) (ref 6–22)
BUN: 28 mg/dL — ABNORMAL HIGH (ref 7–25)
CO2: 22 mmol/L (ref 20–32)
Calcium: 9.4 mg/dL (ref 8.6–10.4)
Chloride: 110 mmol/L (ref 98–110)
Creat: 1.37 mg/dL — ABNORMAL HIGH (ref 0.50–1.03)
Globulin: 2.2 g/dL (ref 1.9–3.7)
Glucose, Bld: 104 mg/dL — ABNORMAL HIGH (ref 65–99)
Potassium: 3.9 mmol/L (ref 3.5–5.3)
Sodium: 141 mmol/L (ref 135–146)
Total Bilirubin: 0.4 mg/dL (ref 0.2–1.2)
Total Protein: 6.7 g/dL (ref 6.1–8.1)
eGFR: 46 mL/min/{1.73_m2} — ABNORMAL LOW (ref >=60)

## 2023-12-20 NOTE — Telephone Encounter (Signed)
 Pharmacy Patient Advocate Encounter   Received notification from Onbase that prior authorization for Mounjaro  7.5MG /0.5ML auto-injectors is required/requested.   Insurance verification completed.   The patient is insured through [facility] .   Per test claim: PA required; PA submitted to above mentioned insurance via CoverMyMeds Key/confirmation #/EOC [id number] Status is pending

## 2023-12-20 NOTE — Telephone Encounter (Signed)
 Pharmacy Patient Advocate Encounter  Received notification from [facility] that Prior Authorization for Mounjaro  7.5MG /0.5ML auto-injectors has been APPROVED from 12/20/23 to 12/19/24   PA #/Case ID/Reference #: [id number]

## 2023-12-24 ENCOUNTER — Telehealth: Payer: Self-pay

## 2023-12-24 ENCOUNTER — Other Ambulatory Visit: Payer: Self-pay

## 2023-12-24 DIAGNOSIS — I10 Essential (primary) hypertension: Secondary | ICD-10-CM

## 2023-12-24 MED ORDER — ACETAZOLAMIDE 250 MG PO TABS: 250.0000 mg | ORAL_TABLET | Freq: Two times a day (BID) | ORAL | 1 refills | Status: DC

## 2023-12-24 NOTE — Telephone Encounter (Signed)
 Prescription Request  12/24/2023  LOV: 11/15/23 cpe  What is the name of the medication or equipment? acetaZOLAMIDE  (DIAMOX ) 250 MG tablet [[id number]]   Have you contacted your pharmacy to request a refill? Yes   Which pharmacy would you like this sent to?  [id number] [place], [place] - [address] [place] [address] Phone: [phone] Fax: [phone]    Patient notified that their request is being sent to the clinical staff for review and that they should receive a response within 2 business days.   Please advise at [facility] [phone]

## 2023-12-25 ENCOUNTER — Other Ambulatory Visit (HOSPITAL_COMMUNITY): Payer: Self-pay

## 2023-12-25 NOTE — Telephone Encounter (Signed)
 Good morning Kimberly [name], her PA was approved 12/20/23 through 12/19/24. Please see telephone encounter 12/20/23

## 2024-01-02 ENCOUNTER — Ambulatory Visit: Admitting: Family Medicine

## 2024-01-10 ENCOUNTER — Other Ambulatory Visit: Payer: Self-pay | Admitting: Family Medicine

## 2024-01-10 DIAGNOSIS — Z794 Long term (current) use of insulin: Secondary | ICD-10-CM

## 2024-01-10 NOTE — Telephone Encounter (Signed)
 Requested medication (s) are due for refill today - unsure  Requested medication (s) are on the active medication list -no  Future visit scheduled -no  Last refill: 08/29/23  Notes to clinic: no longer listed on medication list- sent for review of request  Requested Prescriptions  Pending Prescriptions Disp Refills   Insulin  Disposable Pump (OMNIPOD DASH PODS, GEN 4,) MISC [Pharmacy Med Name: OMNIPOD DASH PODS (GEN 4) 5PK] 10 each 3    Sig: Inject 1 Piece into the skin every 3 (three) days.     Endocrinology: Diabetes - Testing Supplies Passed - 01/10/2024  4:14 PM      Passed - Valid encounter within last 12 months    Recent Outpatient Visits           3 weeks ago Type 2 diabetes mellitus with other specified complication, with long-term current use of insulin  [facility])   [place] [facility] Pickard, Butler DASEN, MD   2 months ago Chronic idiopathic constipation   [place] [facility] Duanne, Butler DASEN, MD   9 months ago Chronic idiopathic constipation   [place] [facility] Duanne Butler DASEN, MD   1 year ago Type 2 diabetes mellitus with other specified complication, with long-term current use of insulin  [facility])   [place] [facility] Duanne Butler DASEN, MD   1 year ago Type 2 diabetes mellitus with other specified complication, with long-term current use of insulin  [facility])   [facility] Pickard, Butler DASEN, MD                 Requested Prescriptions  Pending Prescriptions Disp Refills   Insulin  Disposable Pump (OMNIPOD DASH PODS, GEN 4,) MISC [Pharmacy Med Name: OMNIPOD DASH PODS (GEN 4) 5PK] 10 each 3    Sig: Inject 1 Piece into the skin every 3 (three) days.     Endocrinology: Diabetes - Testing Supplies Passed - 01/10/2024  4:14 PM      Passed - Valid encounter within last 12 months    Recent Outpatient Visits           3 weeks ago Type 2 diabetes mellitus with other  specified complication, with long-term current use of insulin  [facility])   [place] [facility] Duanne, Butler DASEN, MD   2 months ago Chronic idiopathic constipation   [facility] [facility] Duanne Butler DASEN, MD   9 months ago Chronic idiopathic constipation   [place] [facility] Duanne Butler DASEN, MD   1 year ago Type 2 diabetes mellitus with other specified complication, with long-term current use of insulin  [facility])   [place] [facility] Duanne Butler DASEN, MD   1 year ago Type 2 diabetes mellitus with other specified complication, with long-term current use of insulin  [facility])    [facility] Pickard, Butler DASEN, MD

## 2024-01-14 ENCOUNTER — Other Ambulatory Visit: Payer: Self-pay | Admitting: Family Medicine

## 2024-01-14 DIAGNOSIS — J0100 Acute maxillary sinusitis, unspecified: Secondary | ICD-10-CM

## 2024-01-14 NOTE — Telephone Encounter (Signed)
 Prescription Request  01/14/2024  LOV: 12/19/2023  What is the name of the medication or equipment?   cetirizine  (ZYRTEC ) 10 MG tablet [[id number]]  DISCONTINUED  **90 day script requested**  Have you contacted your pharmacy to request a refill? Yes   Which pharmacy would you like this sent to?  [id number] [place], [place] - [address] [place] [address] Phone: [phone] Fax: [phone]    Patient notified that their request is being sent to the clinical staff for review and that they should receive a response within 2 business days.   Please advise pharmacist.

## 2024-01-15 ENCOUNTER — Ambulatory Visit: Payer: Self-pay

## 2024-01-15 NOTE — Telephone Encounter (Signed)
 FYI Only or Action Required?: FYI only for provider.  Patient was last seen in primary care on 12/19/2023 by Duanne Butler DASEN, MD.  Called Nurse Triage reporting Back Pain.  Symptoms began yesterday.  Interventions attempted: OTC medications: Ibuprofen  and Rest, hydration, or home remedies.  Symptoms are: unchanged.  Triage Disposition: See Physician Within 24 Hours  Patient/caregiver understands and will follow disposition?: Yes  Copied from CRM #[id number]. Topic: Clinical - Red Word Triage >> Jan 15, 2024  8:51 AM Tobias L wrote: Red Word that prompted transfer to Nurse Triage: Back and side pain, history of kidney stones pain worsening Reason for Disposition  MODERATE pain (e.g., interferes with normal activities or awakens from sleep)  Answer Assessment - Initial Assessment Questions 1. ONSET: When did the pain begin? (e.g., minutes, hours, days)     Started yesterday 2. LOCATION: Where does it hurt? (upper, mid or lower back)     Left sided back and side pain 3. SEVERITY: How bad is the pain?  (e.g., Scale 1-10; mild, moderate, or severe)     moderate 4. PATTERN: Is the pain constant? (e.g., yes, no; constant, intermittent)      constant 5. RADIATION: Does the pain shoot into your legs or somewhere else?     no 6. CAUSE:  What do you think is causing the back pain?      Patient has a history of kidney stones 7. BACK OVERUSE:  Any recent lifting of heavy objects, strenuous work or exercise?     no 8. MEDICINES: What have you taken so far for the pain? (e.g., nothing, acetaminophen , NSAIDS)     Motrin  800 mg 9. NEUROLOGIC SYMPTOMS: Do you have any weakness, numbness, or problems with bowel/bladder control?     no 10. OTHER SYMPTOMS: Do you have any other symptoms? (e.g., fever, abdomen pain, burning with urination, blood in urine)       Urinary frequency,  Answer Assessment - Initial Assessment Questions 1. LOCATION: Where does it hurt? (e.g., left,  right)     Left sided back pain 2. ONSET: When did the pain start?     yesterday 3. SEVERITY: How bad is the pain? (e.g., Scale 1-10; mild, moderate, or severe)     moderate 4. PATTERN: Does the pain come and go, or is it constant?      Constant since last night 5. CAUSE: What do you think is causing the pain?     Concerned for possible kidney stones 6. OTHER SYMPTOMS:  Do you have any other symptoms? (e.g., fever, abdomen pain, vomiting, leg weakness, burning with urination, blood in urine)     Urinary frequency  Protocols used: Back Pain-A-AH, Flank Pain-A-AH

## 2024-01-16 ENCOUNTER — Encounter: Payer: Self-pay | Admitting: Family Medicine

## 2024-01-16 ENCOUNTER — Ambulatory Visit (INDEPENDENT_AMBULATORY_CARE_PROVIDER_SITE_OTHER): Admitting: Family Medicine

## 2024-01-16 VITALS — BP 110/78 | HR 90 | Temp 98.0°F | Ht 60.0 in | Wt 211.6 lb

## 2024-01-16 DIAGNOSIS — R109 Unspecified abdominal pain: Secondary | ICD-10-CM

## 2024-01-16 LAB — URINALYSIS, ROUTINE W REFLEX MICROSCOPIC
Bilirubin Urine: NEGATIVE
Glucose, UA: NEGATIVE
Hgb urine dipstick: NEGATIVE
Ketones, ur: NEGATIVE
Leukocytes,Ua: NEGATIVE
Nitrite: NEGATIVE
Specific Gravity, Urine: 1.029 (ref 1.001–1.035)
pH: 6 (ref 5.0–8.0)

## 2024-01-16 LAB — MICROSCOPIC MESSAGE

## 2024-01-16 MED ORDER — PREDNISONE 20 MG PO TABS
ORAL_TABLET | ORAL | 0 refills | Status: AC
Start: 2024-01-16 — End: ?

## 2024-01-16 MED ORDER — TIZANIDINE HCL 4 MG PO TABS: 4.0000 mg | ORAL_TABLET | Freq: Four times a day (QID) | ORAL | 0 refills | Status: AC | PRN

## 2024-01-16 NOTE — Telephone Encounter (Signed)
 Requested Prescriptions  Pending Prescriptions Disp Refills   cetirizine  (ZYRTEC ) 10 MG tablet 90 tablet 3    Sig: Take 1 tablet (10 mg total) by mouth daily.     Ear, Nose, and Throat:  Antihistamines 2 Failed - 01/16/2024  1:00 PM      Failed - Cr in normal range and within 360 days    Creat  Date Value Ref Range Status  12/19/2023 1.37 (H) 0.50 - 1.03 mg/dL Final   Creatinine, Urine  Date Value Ref Range Status  10/28/2023 165 20 - 275 mg/dL Final         Passed - Valid encounter within last 12 months    Recent Outpatient Visits           4 weeks ago Type 2 diabetes mellitus with other specified complication, with long-term current use of insulin  [facility])   [place] [facility] Pickard, Butler DASEN, MD   2 months ago Chronic idiopathic constipation   [place] [facility] Duanne Butler DASEN, MD   9 months ago Chronic idiopathic constipation   [place] [facility] Duanne Butler DASEN, MD   1 year ago Type 2 diabetes mellitus with other specified complication, with long-term current use of insulin  [facility])   [place] [facility] Duanne Butler DASEN, MD   1 year ago Type 2 diabetes mellitus with other specified complication, with long-term current use of insulin  [facility])   [place] [facility] Pickard, Butler DASEN, MD

## 2024-01-16 NOTE — Progress Notes (Signed)
 Subjective:    Patient ID: Kimberly [name], female    DOB: Jan 29, 1971, 53 y.o.   MRN: [id number]  Patient reports 2 days of left-sided lower back pain.  The pain is reproducible with palpation in the left flank.  There is no visible bruising or erythema or rash in that area.  The pain does not radiate but it does extend down into the superior part of the gluteus muscle.  She denies any lumbar radiculopathy.  She denies any leg weakness.  She denies any numbness or tingling in her legs or feet.  She denies any saddle anesthesia or bowel or bladder incontinence.  There is no dysuria.  Urinalysis shows no blood no nitrates and no leukocyte esterase.  Patient recently had a kidney stone.  CT scan was obtained in May that showed 2 kidney stones but they were both removed by urology.  She denies any melena or hematochezia or nausea or vomiting.  She reports having regular bowel movements now. Past Medical History:  Diagnosis Date   Abnormal vaginal Pap smear    tx with cryotherapy   Anxiety    Depression    Diabetes mellitus without complication (HCC)    History of kidney stones    passed stone - no surgery   Hypertension    Migraine    otc med prn   Obesity    OSA (obstructive sleep apnea)    severe- RDI 43   Pseudotumor cerebri    SVD (spontaneous vaginal delivery)    x 3   Tension headache    Past Surgical History:  Procedure Laterality Date   ABDOMINAL HYSTERECTOMY     ANTERIOR CERVICAL DECOMP/DISCECTOMY FUSION N/A 01/03/2016   Procedure: ANTERIOR CERVICAL DECOMPRESSION FUSION CERVICAL FIVE-SIX,CERVICAL SIX-SEVEN.;  Surgeon: Gerldine Maizes, MD;  [facility];  Service: Neurosurgery;  Laterality: N/A;  right side approach   CRYOTHERAPY     CYSTOSCOPY/URETEROSCOPY/HOLMIUM LASER/STENT PLACEMENT Left 11/01/2023   Procedure: CYSTOSCOPY/URETEROSCOPY/HOLMIUM LASER/STENT PLACEMENT;  Surgeon: Francisca Redell BROCKS, MD;  Location: [facility];  Service: Urology;  Laterality: Left;  DR.  GRISELDA CARD   LAPAROSCOPIC ASSISTED VAGINAL HYSTERECTOMY Bilateral 04/21/2014   Procedure: LAPAROSCOPIC ASSISTED VAGINAL HYSTERECTOMY, BILATERAL SALPINGECTOMY;  Surgeon: Krystal Deaner, MD;  Location: [facility];  Service: Gynecology;  Laterality: Bilateral;   MANDIBLE FRACTURE SURGERY  2002   x 1   TUBAL LIGATION  11/2003   WISDOM TOOTH EXTRACTION     Current Outpatient Medications on File Prior to Visit  Medication Sig Dispense Refill   acetaZOLAMIDE  (DIAMOX ) 250 MG tablet Take 1 tablet (250 mg total) by mouth 2 (two) times daily. 180 tablet 1   ALPRAZolam  (XANAX ) 1 MG tablet Take 1 tablet (1 mg total) by mouth 3 (three) times daily as needed. 90 tablet 2   atenolol  (TENORMIN ) 100 MG tablet Take 1 tablet (100 mg total) by mouth daily. 90 tablet 3   buPROPion  (WELLBUTRIN  XL) 300 MG 24 hr tablet Take 1 tablet (300 mg total) by mouth daily. 90 tablet 1   Continuous Blood Gluc Sensor (DEXCOM G5 MOB/G4 PLAT SENSOR) MISC USE TO CHECK BLOOD SUGARS UP TO 6 X PER DAY FOR INSULIN  DEPENDANT DIABETIC. 8 each 3   estradiol (VIVELLE-DOT) 0.075 MG/24HR Place onto the skin.     HYDROcodone -acetaminophen  (NORCO/VICODIN) 5-325 MG tablet Take 1 tablet by mouth every 4 (four) hours as needed. 10 tablet 0   Insulin  Disposable Pump (OMNIPOD DASH PODS, GEN 4,) MISC INJECT 1 PIECE INTO THE SKIN EVERY  3 (THREE) DAYS. 10 each 3   lamoTRIgine (LAMICTAL) 100 MG tablet Take 100 mg by mouth daily.     Lancets Misc. (ACCU-CHEK FASTCLIX LANCET) KIT 1 each by Does not apply route daily. Use to check blood sugar 5 times daily. 1 kit 12   loratadine  (CLARITIN ) 10 MG tablet Take 1 tablet (10 mg total) by mouth daily. 90 tablet 1   losartan  (COZAAR ) 50 MG tablet Take 1 tablet (50 mg total) by mouth daily. 90 tablet 1   ondansetron  (ZOFRAN -ODT) 4 MG disintegrating tablet Take 1 tablet (4 mg total) by mouth every 8 (eight) hours as needed for nausea or vomiting. 12 tablet 0   rosuvastatin  (CRESTOR ) 10 MG tablet Take 1 tablet (10 mg  total) by mouth daily. 90 tablet 1   tirzepatide  (MOUNJARO ) 7.5 MG/0.5ML Pen Inject 7.5 mg into the skin once a week. 2 mL 3   traZODone (DESYREL) 100 MG tablet Take 100 mg by mouth at bedtime.     Vilazodone HCl (VIIBRYD) 40 MG TABS Take by mouth daily.     No current facility-administered medications on file prior to visit.       No Known Allergies Social History   Socioeconomic History   Marital status: Divorced    Spouse name: Not on file   Number of children: Not on file   Years of education: Not on file   Highest education level: Not on file  Occupational History   Not on file  Tobacco Use   Smoking status: Never   Smokeless tobacco: Never  Substance and Sexual Activity   Alcohol use: Yes    Alcohol/week: 1.0 standard drink of alcohol    Types: 1 Glasses of wine per week    Comment: one glass of wine per week   Drug use: No   Sexual activity: Yes    Birth control/protection: Surgical  Other Topics Concern   Not on file  Social History Narrative   Not on file   Social Drivers of Health   Financial Resource Strain: Not on file  Food Insecurity: Not on file  Transportation Needs: Not on file  Physical Activity: Not on file  Stress: Not on file  Social Connections: Not on file  Intimate Partner Violence: Not on file     Review of Systems  All other systems reviewed and are negative.      Objective:   Physical Exam Vitals reviewed.  Constitutional:      General: She is not in acute distress.    Appearance: She is well-developed. She is not diaphoretic.  Cardiovascular:     Rate and Rhythm: Normal rate and regular rhythm.     Heart sounds: Normal heart sounds.  Pulmonary:     Effort: Pulmonary effort is normal. No respiratory distress.     Breath sounds: Normal breath sounds. No wheezing or rales.  Abdominal:     General: Bowel sounds are normal. There is no distension.     Palpations: Abdomen is soft.     Tenderness: There is no abdominal  tenderness. There is no guarding or rebound.  Musculoskeletal:     Lumbar back: Spasms and tenderness present. No swelling, edema, signs of trauma or bony tenderness. Normal range of motion.       Back:     Comments: Pain in the lower back was made worse by resisted hip flexion  Lymphadenopathy:     Cervical: No cervical adenopathy.  Assessment & Plan:  Flank pain - Plan: Urinalysis, Routine w reflex microscopic I believe most likely that this is a pulled muscle in the lumbar paraspinal muscles.  Recommended trying Zanaflex  4 mg every 6 hours as needed.  Also start the patient on a prednisone  taper pack for anti-inflammatory.  Right now symptoms do not suggest a herniated disc.  There was no trauma to make me concerned about a possible fracture.  If pain is not improving by next week, proceed with an x-ray of the lumbar spine

## 2024-02-13 ENCOUNTER — Encounter: Payer: Self-pay | Admitting: Family Medicine

## 2024-02-13 ENCOUNTER — Other Ambulatory Visit: Payer: Self-pay

## 2024-02-13 DIAGNOSIS — E7800 Pure hypercholesterolemia, unspecified: Secondary | ICD-10-CM

## 2024-02-13 DIAGNOSIS — J302 Other seasonal allergic rhinitis: Secondary | ICD-10-CM

## 2024-02-13 DIAGNOSIS — E1169 Type 2 diabetes mellitus with other specified complication: Secondary | ICD-10-CM

## 2024-02-13 MED ORDER — TIRZEPATIDE 10 MG/0.5ML ~~LOC~~ SOAJ: 10.0000 mg | SUBCUTANEOUS | 0 refills | Status: DC

## 2024-02-13 MED ORDER — CETIRIZINE HCL 10 MG PO TABS: 10.0000 mg | ORAL_TABLET | Freq: Every day | ORAL | 11 refills | Status: AC

## 2024-02-19 ENCOUNTER — Telehealth: Payer: Self-pay

## 2024-02-19 NOTE — Telephone Encounter (Signed)
 Copied from CRM [id number]. Topic: Clinical - Medication Question >> Feb 19, 2024 10:58 AM Terri MATSU wrote: Reason for CRM: Ky from [facility] called regarding patients prescription Mounjaro . Patient is in a program where it makes the prescription cost $0. Tommye would like if a nurse could contact her back about this. Callback [phone] ext [phone]

## 2024-02-20 NOTE — Telephone Encounter (Signed)
 Lvm for Ky

## 2024-02-25 ENCOUNTER — Other Ambulatory Visit: Payer: Self-pay

## 2024-02-25 DIAGNOSIS — Z1211 Encounter for screening for malignant neoplasm of colon: Secondary | ICD-10-CM

## 2024-02-26 ENCOUNTER — Other Ambulatory Visit: Payer: Self-pay

## 2024-02-26 DIAGNOSIS — E7800 Pure hypercholesterolemia, unspecified: Secondary | ICD-10-CM

## 2024-02-26 DIAGNOSIS — E1169 Type 2 diabetes mellitus with other specified complication: Secondary | ICD-10-CM

## 2024-02-26 MED ORDER — TIRZEPATIDE 10 MG/0.5ML ~~LOC~~ SOAJ: 10.0000 mg | SUBCUTANEOUS | 0 refills | Status: DC

## 2024-03-02 ENCOUNTER — Other Ambulatory Visit: Payer: Self-pay

## 2024-03-02 DIAGNOSIS — I10 Essential (primary) hypertension: Secondary | ICD-10-CM

## 2024-03-02 MED ORDER — ALPRAZOLAM 1 MG PO TABS: 1.0000 mg | ORAL_TABLET | Freq: Three times a day (TID) | ORAL | 2 refills | Status: DC | PRN

## 2024-03-05 ENCOUNTER — Other Ambulatory Visit: Payer: Self-pay | Admitting: Family Medicine

## 2024-03-05 MED ORDER — FLUCONAZOLE 150 MG PO TABS: 150.0000 mg | ORAL_TABLET | Freq: Once | ORAL | 0 refills | Status: AC

## 2024-03-15 ENCOUNTER — Other Ambulatory Visit: Payer: Self-pay | Admitting: Family Medicine

## 2024-03-15 DIAGNOSIS — E1169 Type 2 diabetes mellitus with other specified complication: Secondary | ICD-10-CM

## 2024-03-15 DIAGNOSIS — E7800 Pure hypercholesterolemia, unspecified: Secondary | ICD-10-CM

## 2024-04-09 ENCOUNTER — Encounter: Payer: Self-pay | Admitting: Family Medicine

## 2024-04-10 ENCOUNTER — Encounter: Payer: Self-pay | Admitting: Family Medicine

## 2024-04-10 ENCOUNTER — Ambulatory Visit: Admitting: Family Medicine

## 2024-04-10 VITALS — BP 116/65 | HR 77 | Temp 97.5°F | Ht 60.0 in | Wt 204.4 lb

## 2024-04-10 DIAGNOSIS — M7989 Other specified soft tissue disorders: Secondary | ICD-10-CM | POA: Diagnosis not present

## 2024-04-10 DIAGNOSIS — Z794 Long term (current) use of insulin: Secondary | ICD-10-CM | POA: Diagnosis not present

## 2024-04-10 DIAGNOSIS — E1169 Type 2 diabetes mellitus with other specified complication: Secondary | ICD-10-CM | POA: Diagnosis not present

## 2024-04-10 NOTE — Progress Notes (Deleted)
 Wt Readings from Last 3 Encounters:  04/10/24 204 lb 6.4 oz (92.7 kg)  01/16/24 211 lb 9.6 oz (96 kg)  12/19/23 212 lb (96.2 kg)

## 2024-04-10 NOTE — Progress Notes (Signed)
 Subjective:    Patient ID: Kimberly [name], female    DOB: 30-Jun-1970, 53 y.o.   MRN: [id number]  Patient presents with bilateral leg swelling.  She has +1 edema in both legs from the ankles to the mid shins.  She denies any chest pain or shortness of breath or dyspnea on exertion.  She denies any fevers or chills.  She is concerned that this might be a sign of kidney failure.  She does have chronic kidney disease. Past Medical History:  Diagnosis Date   Abnormal vaginal Pap smear    tx with cryotherapy   Anxiety    Depression    Diabetes mellitus without complication (HCC)    History of kidney stones    passed stone - no surgery   Hypertension    Migraine    otc med prn   Obesity    OSA (obstructive sleep apnea)    severe- RDI 43   Pseudotumor cerebri    SVD (spontaneous vaginal delivery)    x 3   Tension headache    Past Surgical History:  Procedure Laterality Date   ABDOMINAL HYSTERECTOMY     ANTERIOR CERVICAL DECOMP/DISCECTOMY FUSION N/A 01/03/2016   Procedure: ANTERIOR CERVICAL DECOMPRESSION FUSION CERVICAL FIVE-SIX,CERVICAL SIX-SEVEN.;  Surgeon: Gerldine Maizes, MD;  [facility];  Service: Neurosurgery;  Laterality: N/A;  right side approach   CRYOTHERAPY     CYSTOSCOPY/URETEROSCOPY/HOLMIUM LASER/STENT PLACEMENT Left 11/01/2023   Procedure: CYSTOSCOPY/URETEROSCOPY/HOLMIUM LASER/STENT PLACEMENT;  Surgeon: Francisca Redell BROCKS, MD;  [facility];  Service: Urology;  Laterality: Left;  DR. GRISELDA CARD   LAPAROSCOPIC ASSISTED VAGINAL HYSTERECTOMY Bilateral 04/21/2014   Procedure: LAPAROSCOPIC ASSISTED VAGINAL HYSTERECTOMY, BILATERAL SALPINGECTOMY;  Surgeon: Krystal Deaner, MD;  Location[facility];  Service: Gynecology;  Laterality: Bilateral;   MANDIBLE FRACTURE SURGERY  2002   x 1   TUBAL LIGATION  11/2003   WISDOM TOOTH EXTRACTION     Current Outpatient Medications on File Prior to Visit  Medication Sig Dispense Refill   acetaZOLAMIDE  (DIAMOX ) 250 MG  tablet Take 1 tablet (250 mg total) by mouth 2 (two) times daily. 180 tablet 1   ALPRAZolam  (XANAX ) 1 MG tablet Take 1 tablet (1 mg total) by mouth 3 (three) times daily as needed. 90 tablet 2   atenolol  (TENORMIN ) 100 MG tablet Take 1 tablet (100 mg total) by mouth daily. 90 tablet 3   buPROPion  (WELLBUTRIN  XL) 300 MG 24 hr tablet Take 1 tablet (300 mg total) by mouth daily. 90 tablet 1   cetirizine  (ZYRTEC ) 10 MG tablet Take 1 tablet (10 mg total) by mouth daily. 30 tablet 11   Continuous Blood Gluc Sensor (DEXCOM G5 MOB/G4 PLAT SENSOR) MISC USE TO CHECK BLOOD SUGARS UP TO 6 X PER DAY FOR INSULIN  DEPENDANT DIABETIC. 8 each 3   estradiol (VIVELLE-DOT) 0.075 MG/24HR Place onto the skin.     HYDROcodone -acetaminophen  (NORCO/VICODIN) 5-325 MG tablet Take 1 tablet by mouth every 4 (four) hours as needed. 10 tablet 0   Insulin  Disposable Pump (OMNIPOD DASH PODS, GEN 4,) MISC Inject into the skin.     lamoTRIgine (LAMICTAL) 100 MG tablet Take 100 mg by mouth daily.     Lancets Misc. (ACCU-CHEK FASTCLIX LANCET) KIT 1 each by Does not apply route daily. Use to check blood sugar 5 times daily. 1 kit 12   losartan  (COZAAR ) 50 MG tablet Take 1 tablet (50 mg total) by mouth daily. 90 tablet 1   MOUNJARO  10 MG/0.5ML Pen INJECT  10 MG SUBCUTANEOUSLY  ONCE A WEEK 4 mL 0   ondansetron  (ZOFRAN -ODT) 4 MG disintegrating tablet Take 1 tablet (4 mg total) by mouth every 8 (eight) hours as needed for nausea or vomiting. 12 tablet 0   predniSONE  (DELTASONE ) 20 MG tablet 3 tabs poqday 1-2, 2 tabs poqday 3-4, 1 tab poqday 5-6 12 tablet 0   rosuvastatin  (CRESTOR ) 10 MG tablet Take 1 tablet (10 mg total) by mouth daily. 90 tablet 1   tiZANidine  (ZANAFLEX ) 4 MG tablet Take 1 tablet (4 mg total) by mouth every 6 (six) hours as needed for muscle spasms. 30 tablet 0   traZODone (DESYREL) 100 MG tablet Take 100 mg by mouth at bedtime.     Vilazodone HCl (VIIBRYD) 40 MG TABS Take by mouth daily.     No current  facility-administered medications on file prior to visit.       No Known Allergies Social History   Socioeconomic History   Marital status: Divorced    Spouse name: Not on file   Number of children: Not on file   Years of education: Not on file   Highest education level: Not on file  Occupational History   Not on file  Tobacco Use   Smoking status: Never   Smokeless tobacco: Never  Substance and Sexual Activity   Alcohol use: Yes    Alcohol/week: 1.0 standard drink of alcohol    Types: 1 Glasses of wine per week    Comment: one glass of wine per week   Drug use: No   Sexual activity: Yes    Birth control/protection: Surgical  Other Topics Concern   Not on file  Social History Narrative   Not on file   Social Drivers of Health   Financial Resource Strain: Not on file  Food Insecurity: Not on file  Transportation Needs: Not on file  Physical Activity: Not on file  Stress: Not on file  Social Connections: Not on file  Intimate Partner Violence: Not on file     Review of Systems  All other systems reviewed and are negative.      Objective:   Physical Exam Vitals reviewed.  Constitutional:      General: She is not in acute distress.    Appearance: She is well-developed. She is not diaphoretic.  Cardiovascular:     Rate and Rhythm: Normal rate and regular rhythm.     Heart sounds: Normal heart sounds.  Pulmonary:     Effort: Pulmonary effort is normal. No respiratory distress.     Breath sounds: Normal breath sounds. No wheezing or rales.  Abdominal:     General: Bowel sounds are normal. There is no distension.     Palpations: Abdomen is soft.     Tenderness: There is no abdominal tenderness. There is no guarding or rebound.  Musculoskeletal:     Right lower leg: Edema present.     Left lower leg: Edema present.  Lymphadenopathy:     Cervical: No cervical adenopathy.           Assessment & Plan:  Leg swelling - Plan: Brain natriuretic peptide,  Comprehensive metabolic panel with GFR, Urinalysis, Routine w reflex microscopic  Type 2 diabetes mellitus with other specified complication, with long-term current use of insulin  (HCC) - Plan: Hemoglobin A1c  I believe this is doing most likely the venous insufficiency.  I will check a BNP as well as a CMP.  Patient would like to check an A1c while we  are checking her lab work.  Patient also request an echocardiogram to evaluate her heart as she does occasionally have some shortness of breath with activity.  I have recommended that the patient use compression hose to help with leg swelling.  Await the results of her echocardiogram and lab work

## 2024-04-11 LAB — COMPREHENSIVE METABOLIC PANEL WITH GFR
AG Ratio: 1.9 (calc) (ref 1.0–2.5)
ALT: 10 U/L (ref 6–29)
AST: 11 U/L (ref 10–35)
Albumin: 4.3 g/dL (ref 3.6–5.1)
Alkaline phosphatase (APISO): 67 U/L (ref 37–153)
BUN/Creatinine Ratio: 22 (calc) (ref 6–22)
BUN: 31 mg/dL — ABNORMAL HIGH (ref 7–25)
CO2: 25 mmol/L (ref 20–32)
Calcium: 9 mg/dL (ref 8.6–10.4)
Chloride: 110 mmol/L (ref 98–110)
Creat: 1.42 mg/dL — ABNORMAL HIGH (ref 0.50–1.03)
Globulin: 2.3 g/dL (ref 1.9–3.7)
Glucose, Bld: 76 mg/dL (ref 65–99)
Potassium: 3.7 mmol/L (ref 3.5–5.3)
Sodium: 142 mmol/L (ref 135–146)
Total Bilirubin: 0.6 mg/dL (ref 0.2–1.2)
Total Protein: 6.6 g/dL (ref 6.1–8.1)
eGFR: 44 mL/min/1.73m2 — ABNORMAL LOW (ref >=60)

## 2024-04-11 LAB — URINALYSIS, ROUTINE W REFLEX MICROSCOPIC
Bilirubin Urine: NEGATIVE
Glucose, UA: NEGATIVE
Hgb urine dipstick: NEGATIVE
Hyaline Cast: NONE SEEN /LPF
Ketones, ur: NEGATIVE
Leukocytes,Ua: NEGATIVE
Nitrite: NEGATIVE
RBC / HPF: NONE SEEN /HPF (ref 0–2)
Specific Gravity, Urine: 1.027 (ref 1.001–1.035)
pH: 5.5 (ref 5.0–8.0)

## 2024-04-11 LAB — BRAIN NATRIURETIC PEPTIDE: Brain Natriuretic Peptide: 13 pg/mL (ref <100)

## 2024-04-11 LAB — HEMOGLOBIN A1C
Hgb A1c MFr Bld: 5.2 % (ref <5.7)
Mean Plasma Glucose: 103 mg/dL
eAG (mmol/L): 5.7 mmol/L

## 2024-04-11 LAB — MICROSCOPIC MESSAGE

## 2024-04-13 ENCOUNTER — Other Ambulatory Visit: Payer: Self-pay | Admitting: Family Medicine

## 2024-04-13 ENCOUNTER — Ambulatory Visit: Payer: Self-pay | Admitting: Family Medicine

## 2024-04-13 DIAGNOSIS — N183 Chronic kidney disease, stage 3 unspecified: Secondary | ICD-10-CM

## 2024-04-22 ENCOUNTER — Ambulatory Visit
Admission: RE | Admit: 2024-04-22 | Discharge: 2024-04-22 | Disposition: A | Discharge status: Home / Self Care | Source: Ambulatory Visit | Attending: Family Medicine | Admitting: Family Medicine

## 2024-04-22 DIAGNOSIS — N183 Chronic kidney disease, stage 3 unspecified: Secondary | ICD-10-CM

## 2024-04-24 ENCOUNTER — Ambulatory Visit: Payer: Self-pay | Admitting: Family Medicine

## 2024-04-29 ENCOUNTER — Other Ambulatory Visit: Payer: Self-pay | Admitting: Family Medicine

## 2024-04-29 DIAGNOSIS — E1169 Type 2 diabetes mellitus with other specified complication: Secondary | ICD-10-CM

## 2024-04-29 DIAGNOSIS — E7800 Pure hypercholesterolemia, unspecified: Secondary | ICD-10-CM

## 2024-04-30 NOTE — Telephone Encounter (Signed)
 Requested medications are due for refill today.  yes  Requested medications are on the active medications list.  yes  Last refill. 03/16/2024 4mL 0 rf  Future visit scheduled.   no  Notes to clinic.  Medication not assigned to a protocol. Please review    Requested Prescriptions  Pending Prescriptions Disp Refills   MOUNJARO  10 MG/0.5ML Pen [Pharmacy Med Name: Mounjaro  10 MG/0.5ML Subcutaneous Solution Auto-injector] 8 mL 0    Sig: INJECT 10 MG SUBCUTANEOUSLY  ONCE A WEEK     Off-Protocol Failed - 04/30/2024  3:55 PM      Failed - Medication not assigned to a protocol, review manually.      Passed - Valid encounter within last 12 months    Recent Outpatient Visits           2 weeks ago Leg swelling   [place] [facility] Pickard, Butler DASEN, MD   3 months ago Flank pain   [place] [facility] Duanne Butler DASEN, MD   4 months ago Type 2 diabetes mellitus with other specified complication, with long-term current use of insulin  [facility])   [facility] Duanne Butler DASEN, MD   6 months ago Chronic idiopathic constipation   [place] [facility] Pickard, Butler DASEN, MD   1 year ago Chronic idiopathic constipation   [place] [facility] Pickard, Butler DASEN, MD

## 2024-05-01 ENCOUNTER — Other Ambulatory Visit: Payer: Self-pay

## 2024-05-01 ENCOUNTER — Other Ambulatory Visit: Payer: Self-pay | Admitting: Family Medicine

## 2024-05-03 ENCOUNTER — Other Ambulatory Visit: Payer: Self-pay | Admitting: Family Medicine

## 2024-05-03 DIAGNOSIS — E1169 Type 2 diabetes mellitus with other specified complication: Secondary | ICD-10-CM

## 2024-05-19 ENCOUNTER — Ambulatory Visit (HOSPITAL_COMMUNITY)
Admission: RE | Admit: 2024-05-19 | Discharge: 2024-05-19 | Disposition: A | Discharge status: Home / Self Care | Source: Ambulatory Visit | Attending: Family Medicine | Admitting: Family Medicine

## 2024-05-19 DIAGNOSIS — M7989 Other specified soft tissue disorders: Secondary | ICD-10-CM | POA: Insufficient documentation

## 2024-05-19 DIAGNOSIS — R072 Precordial pain: Secondary | ICD-10-CM

## 2024-05-19 DIAGNOSIS — R600 Localized edema: Secondary | ICD-10-CM

## 2024-05-19 DIAGNOSIS — R0609 Other forms of dyspnea: Secondary | ICD-10-CM | POA: Diagnosis not present

## 2024-05-20 LAB — ECHOCARDIOGRAM COMPLETE
AR max vel: 1.76 cm2
AV Area VTI: 1.54 cm2
AV Area mean vel: 1.59 cm2
AV Mean grad: 4 mmHg
AV Peak grad: 7.1 mmHg
Ao pk vel: 1.33 m/s
Area-P 1/2: 3.83 cm2
S' Lateral: 2.53 cm

## 2024-06-03 ENCOUNTER — Other Ambulatory Visit: Payer: Self-pay | Admitting: Family Medicine

## 2024-06-03 MED ORDER — FLUCONAZOLE 150 MG PO TABS: 150.0000 mg | ORAL_TABLET | Freq: Once | ORAL | 0 refills | Status: AC

## 2024-06-05 ENCOUNTER — Other Ambulatory Visit: Payer: Self-pay

## 2024-06-08 MED ORDER — ALPRAZOLAM 1 MG PO TABS: 1.0000 mg | ORAL_TABLET | Freq: Three times a day (TID) | ORAL | 2 refills | Status: AC | PRN

## 2024-06-26 ENCOUNTER — Other Ambulatory Visit: Payer: Self-pay | Admitting: Family Medicine

## 2024-06-26 DIAGNOSIS — I10 Essential (primary) hypertension: Secondary | ICD-10-CM

## 2024-06-26 MED ORDER — TIRZEPATIDE 12.5 MG/0.5ML ~~LOC~~ SOAJ: 12.5000 mg | SUBCUTANEOUS | 3 refills | Status: AC

## 2024-06-26 MED ORDER — ACETAZOLAMIDE 250 MG PO TABS: 250.0000 mg | ORAL_TABLET | Freq: Two times a day (BID) | ORAL | 1 refills | Status: AC

## 2024-07-03 ENCOUNTER — Other Ambulatory Visit: Payer: Self-pay | Admitting: Family Medicine

## 2024-07-03 DIAGNOSIS — E7800 Pure hypercholesterolemia, unspecified: Secondary | ICD-10-CM

## 2024-07-03 DIAGNOSIS — I10 Essential (primary) hypertension: Secondary | ICD-10-CM

## 2024-07-03 NOTE — Telephone Encounter (Signed)
 Prescription Request  07/03/2024  LOV: 04/10/2024  What is the name of the medication or equipment?   rosuvastatin  (CRESTOR ) 10 MG tablet   losartan  (COZAAR ) 50 MG tablet  **90 day scripts requested**  Have you contacted your pharmacy to request a refill? Yes   Which pharmacy would you like this sent to?  [facility] [address] [place] [facility] [address] [place] [address] Phone: [phone] Fax: [phone]    Patient notified that their request is being sent to the clinical staff for review and that they should receive a response within 2 business days.   Please advise pharmacist.

## 2024-07-06 MED ORDER — ROSUVASTATIN CALCIUM 10 MG PO TABS: 10.0000 mg | ORAL_TABLET | Freq: Every day | ORAL | 0 refills | Status: AC

## 2024-07-06 MED ORDER — LOSARTAN POTASSIUM 50 MG PO TABS: 50.0000 mg | ORAL_TABLET | Freq: Every day | ORAL | 0 refills | Status: AC

## 2024-07-06 NOTE — Telephone Encounter (Signed)
 OFFICE VISIT NEEDED FOR ADDITIONAL REFILLS   Requested Prescriptions  Pending Prescriptions Disp Refills   rosuvastatin  (CRESTOR ) 10 MG tablet 90 tablet 0    Sig: Take 1 tablet (10 mg total) by mouth daily.     Cardiovascular:  Antilipid - Statins 2 Failed - 07/06/2024 10:55 AM      Failed - Cr in normal range and within 360 days    Creat  Date Value Ref Range Status  04/10/2024 1.42 (H) 0.50 - 1.03 mg/dL Final   Creatinine, Urine  Date Value Ref Range Status  10/28/2023 165 20 - 275 mg/dL Final         Failed - Lipid Panel in normal range within the last 12 months    Cholesterol  Date Value Ref Range Status  10/18/2022 112 <200 mg/dL Final   LDL Cholesterol (Calc)  Date Value Ref Range Status  10/18/2022 58 mg/dL (calc) Final    Comment:    Reference range: <100 . Desirable range <100 mg/dL for primary prevention;   <70 mg/dL for patients with CHD or diabetic patients  with > or = 2 CHD risk factors. SABRA LDL-C is now calculated using the Martin-Hopkins  calculation, which is a validated novel method providing  better accuracy than the Friedewald equation in the  estimation of LDL-C.  Gladis APPLETHWAITE et al. SANDREA. 7986;689(80): 2061-2068  (http://education.[facility].com/faq/FAQ164)    Direct LDL  Date Value Ref Range Status  08/11/2020 92.0 mg/dL Final    Comment:    Optimal:  <100 mg/dLNear or Above Optimal:  100-129 mg/dLBorderline High:  130-159 mg/dLHigh:  160-189 mg/dLVery High:  >190 mg/dL   HDL  Date Value Ref Range Status  10/18/2022 33 (L) > OR = 50 mg/dL Final   Triglycerides  Date Value Ref Range Status  10/18/2022 133 <150 mg/dL Final         Passed - Patient is not pregnant      Passed - Valid encounter within last 12 months    Recent Outpatient Visits           2 months ago Leg swelling   [place] [facility] Duanne Butler DASEN, MD   5 months ago Flank pain   [place] [facility] Duanne Butler DASEN, MD    6 months ago Type 2 diabetes mellitus with other specified complication, with long-term current use of insulin  [facility])   [place] [facility] Duanne Butler DASEN, MD   8 months ago Chronic idiopathic constipation   [place] [facility] Duanne, Butler DASEN, MD   1 year ago Chronic idiopathic constipation   [facility] Pickard, Butler DASEN, MD               losartan  (COZAAR ) 50 MG tablet 90 tablet 0    Sig: Take 1 tablet (50 mg total) by mouth daily.     Cardiovascular:  Angiotensin Receptor Blockers Failed - 07/06/2024 10:55 AM      Failed - Cr in normal range and within 180 days    Creat  Date Value Ref Range Status  04/10/2024 1.42 (H) 0.50 - 1.03 mg/dL Final   Creatinine, Urine  Date Value Ref Range Status  10/28/2023 165 20 - 275 mg/dL Final         Failed - Valid encounter within last 6 months    Recent Outpatient Visits           2  months ago Leg swelling   [place] [facility] Pickard, Butler DASEN, MD   5 months ago Flank pain   [place] [facility] Duanne Butler DASEN, MD   6 months ago Type 2 diabetes mellitus with other specified complication, with long-term current use of insulin  [facility])   [place] [facility] Duanne Butler DASEN, MD   8 months ago Chronic idiopathic constipation   [place] [facility] Duanne Butler DASEN, MD   1 year ago Chronic idiopathic constipation   [place] [facility] Duanne Butler DASEN, MD              Passed - K in normal range and within 180 days    Potassium  Date Value Ref Range Status  04/10/2024 3.7 3.5 - 5.3 mmol/L Final         Passed - Patient is not pregnant      Passed - Last BP in normal range    BP Readings from Last 1 Encounters:  04/10/24 116/65

## 2024-07-13 ENCOUNTER — Ambulatory Visit

## 2024-07-13 VITALS — Ht 60.0 in | Wt 194.0 lb

## 2024-07-13 DIAGNOSIS — Z1211 Encounter for screening for malignant neoplasm of colon: Secondary | ICD-10-CM

## 2024-07-13 MED ORDER — PEG 3350-KCL-NA BICARB-NACL 420 G PO SOLR: 4000.0000 mL | Freq: Once | ORAL | 0 refills | Status: AC

## 2024-07-13 NOTE — Progress Notes (Signed)
 No egg or soy allergy known to patient  No issues known to pt with past sedation with any surgeries or procedures Patient denies ever being told they had issues or difficulty with intubation  No FH of Malignant Hyperthermia Pt is not on diet pills Pt is not on  home 02  Pt is not on blood thinners  Pt denies issues with constipation-YES  No A fib or A flutter Have any cardiac testing pending--NO Pt can ambulate-INDEPENDENTLY Pt denies use of chewing tobacco Discussed diabetic I weight loss medication holds Discussed NSAID holds Checked BMI Pt instructed to use [employer] or GoodRx for a price reduction on prep  Patient's chart reviewed by Norleen Schillings CNRA prior to previsit and patient appropriate for the [facility].  Pre visit completed and red dot placed by patient's name on their procedure day (on provider's schedule).

## 2024-07-15 ENCOUNTER — Encounter: Payer: Self-pay | Admitting: Internal Medicine

## 2024-07-22 ENCOUNTER — Encounter: Payer: Self-pay | Admitting: *Deleted

## 2024-07-26 ENCOUNTER — Telehealth: Payer: Self-pay

## 2024-07-26 NOTE — Telephone Encounter (Signed)
 Contacted  patient regarding the cancellation of their upcoming colonoscopy appointment and rescheduled for 08/31/24

## 2024-07-27 ENCOUNTER — Encounter: Admitting: Internal Medicine

## 2024-08-31 ENCOUNTER — Encounter: Admitting: Internal Medicine
